# Patient Record
Sex: Female | Born: 1955 | Race: White | Hispanic: No | Marital: Married
Health system: Southern US, Academic
[De-identification: ages and names within clinical notes are randomized; demographics above are authoritative.]

## PROBLEM LIST (undated history)

## (undated) ENCOUNTER — Encounter: Attending: Neurology | Primary: Neurology

## (undated) ENCOUNTER — Ambulatory Visit: Payer: Medicare (Managed Care)

## (undated) ENCOUNTER — Ambulatory Visit
Attending: Student in an Organized Health Care Education/Training Program | Primary: Student in an Organized Health Care Education/Training Program

## (undated) ENCOUNTER — Encounter: Attending: Internal Medicine | Primary: Internal Medicine

## (undated) ENCOUNTER — Encounter

## (undated) ENCOUNTER — Ambulatory Visit: Attending: Internal Medicine | Primary: Internal Medicine

## (undated) ENCOUNTER — Ambulatory Visit: Payer: MEDICAID | Attending: Clinical | Primary: Clinical

## (undated) ENCOUNTER — Ambulatory Visit

## (undated) ENCOUNTER — Ambulatory Visit
Payer: MEDICAID | Attending: Student in an Organized Health Care Education/Training Program | Primary: Student in an Organized Health Care Education/Training Program

## (undated) ENCOUNTER — Ambulatory Visit
Payer: Medicare (Managed Care) | Attending: Student in an Organized Health Care Education/Training Program | Primary: Student in an Organized Health Care Education/Training Program

## (undated) ENCOUNTER — Ambulatory Visit: Payer: MEDICAID | Attending: Internal Medicine | Primary: Internal Medicine

## (undated) ENCOUNTER — Telehealth

## (undated) ENCOUNTER — Ambulatory Visit: Payer: MEDICAID

## (undated) ENCOUNTER — Encounter
Attending: Rehabilitative and Restorative Service Providers" | Primary: Rehabilitative and Restorative Service Providers"

## (undated) ENCOUNTER — Encounter
Attending: Student in an Organized Health Care Education/Training Program | Primary: Student in an Organized Health Care Education/Training Program

## (undated) ENCOUNTER — Telehealth
Attending: Student in an Organized Health Care Education/Training Program | Primary: Student in an Organized Health Care Education/Training Program

## (undated) ENCOUNTER — Encounter: Attending: Clinical | Primary: Clinical

## (undated) ENCOUNTER — Telehealth: Attending: Pharmacist | Primary: Pharmacist

## (undated) ENCOUNTER — Ambulatory Visit: Attending: Addiction (Substance Use Disorder) | Primary: Addiction (Substance Use Disorder)

## (undated) ENCOUNTER — Telehealth: Attending: Internal Medicine | Primary: Internal Medicine

## (undated) ENCOUNTER — Other Ambulatory Visit

## (undated) ENCOUNTER — Encounter
Attending: Pharmacist Clinician (PhC)/ Clinical Pharmacy Specialist | Primary: Pharmacist Clinician (PhC)/ Clinical Pharmacy Specialist

## (undated) ENCOUNTER — Ambulatory Visit: Payer: Medicare (Managed Care) | Attending: Internal Medicine | Primary: Internal Medicine

## (undated) ENCOUNTER — Encounter: Attending: Rheumatology | Primary: Rheumatology

## (undated) ENCOUNTER — Encounter: Attending: Diagnostic Radiology | Primary: Diagnostic Radiology

## (undated) ENCOUNTER — Ambulatory Visit: Attending: Clinical | Primary: Clinical

## (undated) ENCOUNTER — Ambulatory Visit
Attending: Rehabilitative and Restorative Service Providers" | Primary: Rehabilitative and Restorative Service Providers"

## (undated) ENCOUNTER — Ambulatory Visit: Attending: Neurology | Primary: Neurology

## (undated) ENCOUNTER — Encounter: Attending: Addiction (Substance Use Disorder) | Primary: Addiction (Substance Use Disorder)

## (undated) ENCOUNTER — Ambulatory Visit: Payer: MEDICARE

## (undated) ENCOUNTER — Telehealth: Attending: Ambulatory Care | Primary: Ambulatory Care

## (undated) ENCOUNTER — Encounter: Attending: Ophthalmology | Primary: Ophthalmology

## (undated) ENCOUNTER — Ambulatory Visit: Attending: Mental Health | Primary: Mental Health

## (undated) DIAGNOSIS — J449 Chronic obstructive pulmonary disease, unspecified: Secondary | ICD-10-CM

## (undated) DIAGNOSIS — J45909 Unspecified asthma, uncomplicated: Secondary | ICD-10-CM

## (undated) DIAGNOSIS — Z9981 Dependence on supplemental oxygen: Secondary | ICD-10-CM

## (undated) DIAGNOSIS — F329 Major depressive disorder, single episode, unspecified: Secondary | ICD-10-CM

## (undated) DIAGNOSIS — F419 Anxiety disorder, unspecified: Secondary | ICD-10-CM

## (undated) DIAGNOSIS — F32A Depression, unspecified: Secondary | ICD-10-CM

## (undated) DIAGNOSIS — C801 Malignant (primary) neoplasm, unspecified: Secondary | ICD-10-CM

---
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
## 1997-11-23 DIAGNOSIS — C539 Malignant neoplasm of cervix uteri, unspecified: Secondary | ICD-10-CM

## 1997-11-23 HISTORY — DX: Malignant neoplasm of cervix uteri, unspecified: C53.9

## 2015-10-23 ENCOUNTER — Other Ambulatory Visit: Payer: Self-pay | Admitting: Oncology

## 2015-10-23 ENCOUNTER — Ambulatory Visit
Admission: RE | Admit: 2015-10-23 | Discharge: 2015-10-23 | Disposition: A | Discharge status: Home / Self Care | Payer: Self-pay | Source: Ambulatory Visit | Attending: Oncology | Admitting: Oncology

## 2015-10-23 ENCOUNTER — Ambulatory Visit: Payer: Self-pay | Attending: Oncology

## 2015-10-23 ENCOUNTER — Ambulatory Visit: Payer: Self-pay

## 2015-10-23 VITALS — BP 101/65 | HR 71 | Temp 96.6°F | Resp 18 | Ht 69.69 in | Wt 110.3 lb

## 2015-10-23 DIAGNOSIS — N63 Unspecified lump in unspecified breast: Secondary | ICD-10-CM

## 2015-10-23 DIAGNOSIS — Z0000 Encounter for general adult medical examination without abnormal findings: Secondary | ICD-10-CM

## 2015-10-23 NOTE — Progress Notes (Signed)
Subjective:     Patient ID: Shelby Ibarra, female   DOB: [birth year], 59 y.o.   MRN: [id number]  HPI   Review of Systems     Objective:   Physical Exam  Pulmonary/Chest: Right breast exhibits no inverted nipple, no mass, no nipple discharge, no skin change and no tenderness. Left breast exhibits no inverted nipple, no mass, no nipple discharge, no skin change and no tenderness. Breasts are symmetrical.  Genitourinary: No labial fusion. There is no rash, tenderness, lesion or injury on the right labia. There is no rash, tenderness, lesion or injury on the left labia. No erythema, tenderness or bleeding in the vagina. No foreign body around the vagina. No signs of injury around the vagina. No vaginal discharge found.  Patient had hysterectomy/oopherectomy in 1999       Assessment:     59 year old patient presents for [facility] clinic visit.  Patient screened, and meets BCCCP eligibility.  Patient does not have insurance, Medicare or Medicaid.  Handout given on Affordable Care Act.  Instructed patient on breast self-exam using teach back method.  Patient had an abnormal mammogram in 1999 requiring follow-up, but patient did not return. Her mother ,and maternal grandmother both had breast cancer.  Her daughter had BRCA testing with positive results.  She has chosen surveillance at this time.  Patient is not interested in BRCA testing even after explanation of possible financial assistance.   CBE unremarkable.  No mass or lump palpated.  Patient had hysterectomy in 1999.  She has a history of cervical cancer. Pelvic exam normal. No cervix visualized.    Plan:     Per Janett Billow in [facility], patient requires a bilateral diagnostic mammogram, and left breast ultrasound due to prior mammogram results.

## 2015-10-29 LAB — PAP LB AND HPV HIGH-RISK
HPV, HIGH-RISK: NEGATIVE
PAP SMEAR COMMENT: 0

## 2015-10-31 ENCOUNTER — Other Ambulatory Visit: Payer: Self-pay

## 2015-10-31 DIAGNOSIS — R920 Mammographic microcalcification found on diagnostic imaging of breast: Secondary | ICD-10-CM

## 2015-11-07 NOTE — Progress Notes (Unsigned)
Letter mailed to patient to notify of 6 month follow-up mammogram on Thursday 05/07/16 ay 1:40 p.m.  Copy to [facility].

## 2016-05-07 ENCOUNTER — Ambulatory Visit: Admission: RE | Admit: 2016-05-07 | Payer: Self-pay | Source: Ambulatory Visit

## 2016-05-07 ENCOUNTER — Other Ambulatory Visit: Payer: Self-pay

## 2017-05-31 ENCOUNTER — Ambulatory Visit: Payer: Self-pay | Attending: Oncology

## 2018-03-21 ENCOUNTER — Ambulatory Visit
Admit: 2018-03-21 | Discharge: 2018-03-21 | Attending: Rehabilitative and Restorative Service Providers" | Primary: Rehabilitative and Restorative Service Providers"

## 2018-03-21 ENCOUNTER — Ambulatory Visit: Admit: 2018-03-21 | Discharge: 2018-03-21

## 2018-03-21 DIAGNOSIS — M25552 Pain in left hip: Secondary | ICD-10-CM

## 2018-03-21 DIAGNOSIS — M25551 Pain in right hip: Principal | ICD-10-CM

## 2018-03-21 MED ORDER — NAPROXEN 500 MG TABLET
ORAL_TABLET | Freq: Two times a day (BID) | ORAL | 1 refills | 0 days | Status: CP
Start: 2018-03-21 — End: ?

## 2019-01-13 ENCOUNTER — Inpatient Hospital Stay (HOSPITAL_COMMUNITY)
Admission: AD | Admit: 2019-01-13 | Payer: Federal, State, Local not specified - Other | Source: Intra-hospital | Admitting: Psychiatry

## 2019-05-09 ENCOUNTER — Emergency Department: Payer: Self-pay

## 2019-05-09 ENCOUNTER — Emergency Department
Admission: EM | Admit: 2019-05-09 | Discharge: 2019-05-09 | Disposition: A | Discharge status: Home / Self Care | Payer: Self-pay | Attending: Emergency Medicine | Admitting: Emergency Medicine

## 2019-05-09 ENCOUNTER — Other Ambulatory Visit: Payer: Self-pay

## 2019-05-09 ENCOUNTER — Encounter: Payer: Self-pay | Admitting: Emergency Medicine

## 2019-05-09 DIAGNOSIS — R531 Weakness: Secondary | ICD-10-CM

## 2019-05-09 DIAGNOSIS — J45909 Unspecified asthma, uncomplicated: Secondary | ICD-10-CM | POA: Insufficient documentation

## 2019-05-09 DIAGNOSIS — R4182 Altered mental status, unspecified: Secondary | ICD-10-CM

## 2019-05-09 DIAGNOSIS — Z87891 Personal history of nicotine dependence: Secondary | ICD-10-CM | POA: Insufficient documentation

## 2019-05-09 DIAGNOSIS — Z20828 Contact with and (suspected) exposure to other viral communicable diseases: Secondary | ICD-10-CM | POA: Insufficient documentation

## 2019-05-09 DIAGNOSIS — Z8541 Personal history of malignant neoplasm of cervix uteri: Secondary | ICD-10-CM | POA: Insufficient documentation

## 2019-05-09 HISTORY — DX: Malignant (primary) neoplasm, unspecified: C80.1

## 2019-05-09 HISTORY — DX: Unspecified asthma, uncomplicated: J45.909

## 2019-05-09 HISTORY — DX: Depression, unspecified: F32.A

## 2019-05-09 HISTORY — DX: Anxiety disorder, unspecified: F41.9

## 2019-05-09 LAB — URINALYSIS, COMPLETE (UACMP) WITH MICROSCOPIC
Bacteria, UA: NONE SEEN
Bilirubin Urine: NEGATIVE
Glucose, UA: NEGATIVE mg/dL
Hgb urine dipstick: NEGATIVE
Ketones, ur: NEGATIVE mg/dL
Leukocytes,Ua: NEGATIVE
Nitrite: NEGATIVE
Protein, ur: NEGATIVE mg/dL
Specific Gravity, Urine: 1.012 (ref 1.005–1.030)
pH: 6 (ref 5.0–8.0)

## 2019-05-09 LAB — COMPREHENSIVE METABOLIC PANEL
ALT: 8 U/L (ref 0–44)
AST: 13 U/L — ABNORMAL LOW (ref 15–41)
Albumin: 3.5 g/dL (ref 3.5–5.0)
Alkaline Phosphatase: 77 U/L (ref 38–126)
Anion gap: 7 (ref 5–15)
BUN: 14 mg/dL (ref 6–20)
CO2: 29 mmol/L (ref 22–32)
Calcium: 8.9 mg/dL (ref 8.9–10.3)
Chloride: 104 mmol/L (ref 98–111)
Creatinine, Ser: 0.61 mg/dL (ref 0.44–1.00)
GFR calc Af Amer: >60 mL/min (ref >60)
GFR calc non Af Amer: >60 mL/min (ref >60)
Glucose, Bld: 102 mg/dL — ABNORMAL HIGH (ref 70–99)
Potassium: 3.8 mmol/L (ref 3.5–5.1)
Sodium: 140 mmol/L (ref 135–145)
Total Bilirubin: 0.4 mg/dL (ref 0.3–1.2)
Total Protein: 7.7 g/dL (ref 6.5–8.1)

## 2019-05-09 LAB — CBC
HCT: 38 % (ref 36.0–46.0)
Hemoglobin: 12.1 g/dL (ref 12.0–15.0)
MCH: 29.5 pg (ref 26.0–34.0)
MCHC: 31.8 g/dL (ref 30.0–36.0)
MCV: 92.7 fL (ref 80.0–100.0)
Platelets: 188 10*3/uL (ref 150–400)
RBC: 4.1 MIL/uL (ref 3.87–5.11)
RDW: 13.2 % (ref 11.5–15.5)
WBC: 3.5 10*3/uL — ABNORMAL LOW (ref 4.0–10.5)
nRBC: 0 % (ref 0.0–0.2)

## 2019-05-09 LAB — SARS CORONAVIRUS 2 BY RT PCR (HOSPITAL ORDER, PERFORMED IN ~~LOC~~ HOSPITAL LAB): SARS Coronavirus 2: NEGATIVE

## 2019-05-09 LAB — LACTIC ACID, PLASMA: Lactic Acid, Venous: 0.5 mmol/L (ref 0.5–1.9)

## 2019-05-09 LAB — TROPONIN I: Troponin I: <0.03 ng/mL (ref <0.03)

## 2019-05-09 MED ORDER — ALBUTEROL SULFATE HFA 108 (90 BASE) MCG/ACT IN AERS: 2.0000 | INHALATION_SPRAY | Freq: Four times a day (QID) | RESPIRATORY_TRACT | 0 refills | Status: AC | PRN

## 2019-05-09 MED ORDER — ALBUTEROL SULFATE (2.5 MG/3ML) 0.083% IN NEBU
2.5000 mg | INHALATION_SOLUTION | Freq: Once | RESPIRATORY_TRACT | Status: AC
  Administered 2019-05-09: 13:00:00 2.5 mg via RESPIRATORY_TRACT
  Filled 2019-05-09: qty 3

## 2019-05-09 MED ORDER — SODIUM CHLORIDE 0.9% FLUSH
3.0000 mL | Freq: Once | INTRAVENOUS | Status: AC
  Administered 2019-05-09: 13:00:00 3 mL via INTRAVENOUS

## 2019-05-09 MED ORDER — AZITHROMYCIN 250 MG PO TABS: ORAL_TABLET | ORAL | 0 refills | Status: DC

## 2019-05-09 MED ORDER — PREDNISONE 20 MG PO TABS: 40.0000 mg | ORAL_TABLET | Freq: Every day | ORAL | 0 refills | Status: AC

## 2019-05-09 MED ORDER — SODIUM CHLORIDE 0.9 % IV BOLUS
500.0000 mL | Freq: Once | INTRAVENOUS | Status: AC
  Administered 2019-05-09: 13:00:00 500 mL via INTRAVENOUS

## 2019-05-09 NOTE — ED Notes (Signed)
MD at bedside. 

## 2019-05-09 NOTE — ED Triage Notes (Signed)
Pt daughter reports that when her mom awoke from her sleep this am about 30 minutes ago she was having difficulty walking and speaking and stated that she saw someone in the house. Pt reports that she hit her head when the trunk closed on it about 4 days ago.

## 2019-05-09 NOTE — ED Notes (Signed)
Pt family updated

## 2019-05-09 NOTE — ED Notes (Signed)
CT to take pt soon. Will collect blood work once back from CT.

## 2019-05-09 NOTE — ED Notes (Addendum)
Pt assisted to bedside toilet. Pt able to take 5 steps to toilet and back to bed without becoming unsteady or too weak. Daughter remains at bedside.

## 2019-05-09 NOTE — Discharge Instructions (Addendum)
It is possible that you have COPD which has not yet been diagnosed, or you are having acute bronchitis or inflammation of the lungs.    Take the prednisone as prescribed for the next 5 days and use the albuterol every 4-6 hours.  Return to the ER for new or worsening shortness of breath, wheezing, chest pain, fever, recurrent confusion, weakness, difficulty walking, or any other new or worsening symptoms that concern you.

## 2019-05-09 NOTE — ED Provider Notes (Signed)
[facility] Emergency Department Provider Note ____________________________________________   First MD Initiated Contact with Patient 05/09/19 1038     (approximate)  I have reviewed the triage vital signs and the nursing notes.   HISTORY  Chief Complaint Hallucinations, Headache, and Aphasia    HPI Shelby Ibarra is a 63 y.o. female with PMH as noted below who presents with difficulty walking, acute onset today, now somewhat improved, and associated with confusion.  The patient reports that she hit her head with a trunk lid 4 days ago but did not lose consciousness.  She had no other injuries.  She developed a headache this morning.  When the daughter spoke to her on the phone she noted that the patient was confused and was reporting that there was a man inside her house, and then when the daughter came to the house she noted that the patient was having difficulty walking.  In addition the patient has had cough and some shortness of breath with wheezing over the last few weeks.  She has had no fever or chest pain.  She is a former longtime smoker but has never been formally diagnosed with COPD.  Past Medical History:  Diagnosis Date  . Anxiety   . Asthma   . Cancer ([place])   . Cervical cancer ([place]) 1999  . Depression     There are no active problems to display for this patient.   No past surgical history on file.  Prior to Admission medications   Medication Sig Start Date End Date Taking? Authorizing Provider  albuterol (VENTOLIN HFA) 108 (90 Base) MCG/ACT inhaler Inhale 2 puffs into the lungs every 6 (six) hours as needed for wheezing or shortness of breath. 05/09/19   Arta Silence, MD  azithromycin (ZITHROMAX Z-PAK) 250 MG tablet 2 tablets (500mg ) on day 1, then 1 tablet PO daily x 4 days 05/09/19   Arta Silence, MD  predniSONE (DELTASONE) 20 MG tablet Take 2 tablets (40 mg total) by mouth daily for 5 days. 05/09/19 05/14/19  Arta Silence, MD    Allergies Patient has no known allergies.  Family History  Problem Relation Age of Onset  . Breast cancer Mother 65       Deceased  . Ovarian cancer Mother 22  . Breast cancer Maternal Grandmother        approximately 67's    Social History Social History   Tobacco Use  . Smoking status: Former Smoker    Packs/day: 2.00    Years: 25.00    Pack years: 50.00    Types: Cigarettes    Quit date: 10/22/2009    Years since quitting: 9.5  . Smokeless tobacco: Never Used  Substance Use Topics  . Alcohol use: No    Alcohol/week: 0.0 standard drinks  . Drug use: No    Review of Systems  Constitutional: No fever/chills. Eyes: No redness. ENT: No neck pain. Cardiovascular: Denies chest pain. Respiratory: Positive for shortness of breath. Gastrointestinal: No vomiting or diarrhea.  Genitourinary: Negative for flank pain.  Musculoskeletal: Negative for back pain. Skin: Negative for rash. Neurological: Positive for headache.   ____________________________________________   PHYSICAL EXAM:  VITAL SIGNS: ED Triage Vitals  Enc Vitals Group     BP 05/09/19 1003 (!) 115/59     Pulse Rate 05/09/19 1003 (!) 107     Resp 05/09/19 1003 18     Temp 05/09/19 1003 98.1 F (36.7 C)     Temp Source 05/09/19 1003  Oral     SpO2 05/09/19 1003 95 %     Weight 05/09/19 1000 120 lb (54.4 kg)     Height 05/09/19 1000 5\' 11"  (1.803 m)     Head Circumference --      Peak Flow --      Pain Score 05/09/19 0959 10     Pain Loc --      Pain Edu? --      Excl. in [place]? --     Constitutional: Alert and oriented x4.  Somewhat frail-appearing but in no acute distress. Eyes: Conjunctivae are normal.  EOMI.  PERRLA. Head: Atraumatic. Nose: No congestion/rhinnorhea. Mouth/Throat: Mucous membranes are slightly dry.   Neck: Normal range of motion.  Cardiovascular: Normal rate, regular rhythm. Grossly normal heart sounds.  Good peripheral circulation. Respiratory: Normal  respiratory effort.  No retractions.  Bilateral expiratory wheezing. Gastrointestinal: Soft and nontender. No distention.  Genitourinary: No flank tenderness. Musculoskeletal: No lower extremity edema.  Extremities warm and well perfused.  Neurologic:  Normal speech and language.  No facial droop.  Motor intact and sensory in all extremities.  No pronator drift.  Normal coordination on finger-to-nose.  No acute neurologic abnormalities noted. Skin:  Skin is warm and dry. No rash noted. Psychiatric: Mood and affect are normal. Speech and behavior are normal.  ____________________________________________   LABS (all labs ordered are listed, but only abnormal results are displayed)  Labs Reviewed  COMPREHENSIVE METABOLIC PANEL - Abnormal; Notable for the following components:      Result Value   Glucose, Bld 102 (*)    AST 13 (*)    All other components within normal limits  CBC - Abnormal; Notable for the following components:   WBC 3.5 (*)    All other components within normal limits  URINALYSIS, COMPLETE (UACMP) WITH MICROSCOPIC - Abnormal; Notable for the following components:   Color, Urine YELLOW (*)    APPearance CLEAR (*)    All other components within normal limits  SARS CORONAVIRUS 2 (HOSPITAL ORDER, [facility])  TROPONIN I  LACTIC ACID, PLASMA  LACTIC ACID, PLASMA   ____________________________________________  EKG  ED ECG REPORT I, Arta Silence, the attending physician, personally viewed and interpreted this ECG.  Date: 05/09/2019 EKG Time: 1217 Rate: 87 Rhythm: normal sinus rhythm QRS Axis: normal Intervals: normal ST/T Wave abnormalities: normal Narrative Interpretation: no evidence of acute ischemia  ____________________________________________  RADIOLOGY  CT head: No acute abnormality CXR: Possible faint infiltrate right lower base and findings consistent with COPD.  ____________________________________________    PROCEDURES  Procedure(s) performed: No  Procedures  Critical Care performed: No ____________________________________________   INITIAL IMPRESSION / ASSESSMENT AND PLAN / ED COURSE  Pertinent labs & imaging results that were available during my care of the patient were reviewed by me and considered in my medical decision making (see chart for details).  63 year old female with PMH as noted above presents with headache, difficulty walking, and altered mental status noted today.  The patient had a minor head trauma with no LOC 4 days ago but had no symptoms after that.  In addition the patient has had some cough and shortness of breath for the last few weeks with no fever.  I reviewed the past medical records in [place].  The patient has no prior ED visits or admissions here.  On exam the patient is somewhat frail and appears older than her stated age, but she is alert and conversant.  She is oriented  x4.  Her vital signs are normal except for borderline tachycardia.  Neurologic exam is nonfocal.  There is no visible trauma.  She does have diffuse wheezing bilaterally but no respiratory distress or increased work of breathing.  Overall I am most concerned for a possible ICH versus postconcussive type symptoms, with less likely TIA.  The patient has no ongoing neuro deficits to suggest acute stroke.  Differential also includes infectious etiologies, dehydration, or other metabolic cause.  I have a low suspicion for COVID-19 as she has no high risk exposures, but given the cough and shortness of breath I will test for it.  We will obtain CT head, chest x-ray, lab work-up, and reassess.  ----------------------------------------- 3:18 PM on 05/09/2019 -----------------------------------------  CT head shows no acute abnormalities.  Chest x-ray shows findings compatible with COPD and a possible mild right lower lobe infiltrate.  The lab work-up is reassuring.  The patient has no UTI or other  evidence of active infection or sepsis.  Troponin is negative.  COVID swab is negative as well.  On reassessment, the patient appears well and states that she feels comfortable.  She has had no further confusion while in the ED and the daughter states she is at her baseline.  After reviewing the work-up I think it is likely that the patient has undiagnosed COPD and is possible that with the wheezing and shortness of that she is having a mild exacerbation, which could have caused hypercapnia this morning, resulting in transient altered mental status.  Given that the patient has been in the ED for almost 6 hours and has been at her baseline with no recurrent AMS and no significant respiratory symptoms, she is stable for discharge home.  The patient is eager to go home and her daughter agrees with the plan.  I counseled him on the results of the work-up.  I will start the patient on a course of prednisone and prescribe an albuterol inhaler.  I also prescribed azithromycin for a possible early infiltrate.  I gave the patient and her daughter thorough return precautions and they expressed understanding. _____________________________  Shelby Ibarra was evaluated in Emergency Department on 05/09/2019 for the symptoms described in the history of present illness. She was evaluated in the context of the global COVID-19 pandemic, which necessitated consideration that the patient might be at risk for infection with the SARS-CoV-2 virus that causes COVID-19. Institutional protocols and algorithms that pertain to the evaluation of patients at risk for COVID-19 are in a state of rapid change based on information released by regulatory bodies including the CDC and federal and state organizations. These policies and algorithms were followed during the patient's care in the ED.  ____________________________________________   FINAL CLINICAL IMPRESSION(S) / ED DIAGNOSES  Final diagnoses:  Weakness  Altered mental  status, unspecified altered mental status type      NEW MEDICATIONS STARTED DURING THIS VISIT:  New Prescriptions   ALBUTEROL (VENTOLIN HFA) 108 (90 BASE) MCG/ACT INHALER    Inhale 2 puffs into the lungs every 6 (six) hours as needed for wheezing or shortness of breath.   AZITHROMYCIN (ZITHROMAX Z-PAK) 250 MG TABLET    2 tablets (500mg ) on day 1, then 1 tablet PO daily x 4 days   PREDNISONE (DELTASONE) 20 MG TABLET    Take 2 tablets (40 mg total) by mouth daily for 5 days.     Note:  This document was prepared using Dragon voice recognition software and may include unintentional dictation  errors.    Arta Silence, MD 05/09/19 1520

## 2019-05-09 NOTE — ED Notes (Signed)
EKG completed

## 2019-06-17 ENCOUNTER — Ambulatory Visit: Admit: 2019-06-17 | Discharge: 2019-06-17

## 2019-08-02 ENCOUNTER — Ambulatory Visit
Admit: 2019-08-02 | Discharge: 2019-08-03 | Attending: Student in an Organized Health Care Education/Training Program | Primary: Student in an Organized Health Care Education/Training Program

## 2019-08-02 DIAGNOSIS — R51 Headache: Secondary | ICD-10-CM

## 2019-08-02 DIAGNOSIS — R404 Transient alteration of awareness: Secondary | ICD-10-CM

## 2019-08-02 DIAGNOSIS — R269 Unspecified abnormalities of gait and mobility: Secondary | ICD-10-CM

## 2019-08-24 ENCOUNTER — Ambulatory Visit: Admit: 2019-08-24 | Discharge: 2019-08-25

## 2019-08-24 DIAGNOSIS — R269 Unspecified abnormalities of gait and mobility: Secondary | ICD-10-CM

## 2019-08-29 ENCOUNTER — Ambulatory Visit: Admit: 2019-08-29 | Discharge: 2019-08-30

## 2019-08-29 DIAGNOSIS — R269 Unspecified abnormalities of gait and mobility: Secondary | ICD-10-CM

## 2019-09-21 ENCOUNTER — Ambulatory Visit
Admit: 2019-09-21 | Discharge: 2019-09-21 | Attending: Student in an Organized Health Care Education/Training Program | Primary: Student in an Organized Health Care Education/Training Program

## 2019-09-21 ENCOUNTER — Ambulatory Visit: Admit: 2019-09-21 | Discharge: 2019-09-21

## 2019-09-21 DIAGNOSIS — R269 Unspecified abnormalities of gait and mobility: Principal | ICD-10-CM

## 2019-09-28 DIAGNOSIS — E559 Vitamin D deficiency, unspecified: Principal | ICD-10-CM

## 2019-09-28 MED ORDER — CHOLECALCIFEROL (VITAMIN D3) 125 MCG (5,000 UNIT) TABLET
ORAL_TABLET | Freq: Every day | ORAL | 1 refills | 30 days | Status: CP
Start: 2019-09-28 — End: 2019-11-27

## 2019-09-29 DIAGNOSIS — G249 Dystonia, unspecified: Principal | ICD-10-CM

## 2019-09-30 DIAGNOSIS — G249 Dystonia, unspecified: Principal | ICD-10-CM

## 2019-10-03 ENCOUNTER — Ambulatory Visit
Admit: 2019-10-03 | Discharge: 2019-11-01 | Attending: Rehabilitative and Restorative Service Providers" | Primary: Rehabilitative and Restorative Service Providers"

## 2019-10-03 ENCOUNTER — Ambulatory Visit: Admit: 2019-10-03 | Discharge: 2019-11-01

## 2019-10-09 ENCOUNTER — Ambulatory Visit: Admit: 2019-10-09 | Discharge: 2019-10-10

## 2019-10-11 ENCOUNTER — Ambulatory Visit
Admit: 2019-10-11 | Discharge: 2019-10-11 | Disposition: A | Discharge status: Home / Self Care | Payer: MEDICAID | Attending: Student in an Organized Health Care Education/Training Program

## 2019-10-11 DIAGNOSIS — G629 Polyneuropathy, unspecified: Principal | ICD-10-CM

## 2019-10-11 DIAGNOSIS — M792 Neuralgia and neuritis, unspecified: Principal | ICD-10-CM

## 2019-10-11 DIAGNOSIS — R062 Wheezing: Principal | ICD-10-CM

## 2019-10-11 MED ORDER — AMITRIPTYLINE 25 MG TABLET
ORAL_TABLET | Freq: Every evening | ORAL | 3 refills | 30.00000 days | Status: CP
Start: 2019-10-11 — End: 2020-02-08

## 2019-10-12 DIAGNOSIS — M3509 Sicca syndrome with other organ involvement: Principal | ICD-10-CM

## 2019-10-12 DIAGNOSIS — Z7952 Long term (current) use of systemic steroids: Principal | ICD-10-CM

## 2019-10-12 MED ORDER — PANTOPRAZOLE 40 MG TABLET,DELAYED RELEASE
ORAL_TABLET | Freq: Every day | ORAL | 1 refills | 30.00000 days | Status: CP
Start: 2019-10-12 — End: 2020-10-11

## 2019-10-12 MED ORDER — CALCIUM CARBONATE 500 MG CALCIUM (1,250 MG) CHEWABLE TABLET
ORAL_TABLET | Freq: Every day | ORAL | 11 refills | 30 days | Status: CP
Start: 2019-10-12 — End: 2020-10-11

## 2019-10-12 MED ORDER — PREDNISONE 50 MG TABLET
ORAL_TABLET | Freq: Every day | ORAL | 0 refills | 30 days | Status: CP
Start: 2019-10-12 — End: 2019-11-11

## 2019-10-13 MED ORDER — SULFAMETHOXAZOLE 800 MG-TRIMETHOPRIM 160 MG TABLET
ORAL_TABLET | ORAL | 2 refills | 28.00000 days | Status: CP
Start: 2019-10-13 — End: 2020-01-11

## 2019-10-16 DIAGNOSIS — M3500 Sicca syndrome, unspecified: Principal | ICD-10-CM

## 2019-10-17 ENCOUNTER — Ambulatory Visit: Admit: 2019-10-17 | Discharge: 2019-10-18

## 2019-10-23 ENCOUNTER — Ambulatory Visit: Admit: 2019-10-23 | Discharge: 2019-10-23

## 2019-10-23 ENCOUNTER — Ambulatory Visit: Admit: 2019-10-23 | Discharge: 2019-10-23 | Attending: Internal Medicine | Primary: Internal Medicine

## 2019-10-30 ENCOUNTER — Ambulatory Visit: Admit: 2019-10-30 | Discharge: 2019-10-31

## 2019-10-30 DIAGNOSIS — M3500 Sicca syndrome, unspecified: Principal | ICD-10-CM

## 2019-10-31 ENCOUNTER — Ambulatory Visit: Admit: 2019-10-31 | Discharge: 2019-11-01

## 2019-11-01 ENCOUNTER — Ambulatory Visit: Admit: 2019-11-01 | Discharge: 2019-11-02

## 2019-11-01 DIAGNOSIS — M3500 Sicca syndrome, unspecified: Principal | ICD-10-CM

## 2019-11-06 ENCOUNTER — Ambulatory Visit: Admit: 2019-11-06 | Discharge: 2019-12-01

## 2019-11-06 ENCOUNTER — Ambulatory Visit
Admit: 2019-11-06 | Discharge: 2019-12-01 | Attending: Rehabilitative and Restorative Service Providers" | Primary: Rehabilitative and Restorative Service Providers"

## 2019-11-08 ENCOUNTER — Ambulatory Visit: Admit: 2019-11-08 | Discharge: 2019-11-09 | Attending: Ophthalmology | Primary: Ophthalmology

## 2019-11-21 DIAGNOSIS — M3500 Sicca syndrome, unspecified: Principal | ICD-10-CM

## 2019-11-29 ENCOUNTER — Ambulatory Visit: Admit: 2019-11-29 | Discharge: 2019-11-29

## 2019-12-02 MED ORDER — PREDNISONE 20 MG TABLET
ORAL_TABLET | Freq: Every day | ORAL | 0 refills | 14.00000 days | Status: CP
Start: 2019-12-02 — End: 2019-12-16

## 2019-12-05 ENCOUNTER — Ambulatory Visit
Admit: 2019-12-05 | Discharge: 2019-12-31 | Attending: Rehabilitative and Restorative Service Providers" | Primary: Rehabilitative and Restorative Service Providers"

## 2019-12-05 ENCOUNTER — Ambulatory Visit: Admit: 2019-12-05 | Discharge: 2019-12-31

## 2019-12-06 ENCOUNTER — Ambulatory Visit: Admit: 2019-12-06 | Discharge: 2019-12-07

## 2019-12-13 ENCOUNTER — Ambulatory Visit: Admit: 2019-12-13 | Discharge: 2019-12-14 | Attending: Internal Medicine | Primary: Internal Medicine

## 2019-12-13 ENCOUNTER — Ambulatory Visit: Admit: 2019-12-13 | Discharge: 2019-12-14

## 2019-12-13 MED ORDER — AMITRIPTYLINE 25 MG TABLET
ORAL_TABLET | Freq: Every evening | ORAL | 3 refills | 30.00000 days | Status: CP
Start: 2019-12-13 — End: 2020-04-11

## 2019-12-13 MED ORDER — ALBUTEROL SULFATE HFA 90 MCG/ACTUATION AEROSOL INHALER
RESPIRATORY_TRACT | 3 refills | 0 days | Status: CP | PRN
Start: 2019-12-13 — End: ?

## 2019-12-17 MED ORDER — PREDNISONE 10 MG TABLET
ORAL_TABLET | Freq: Every day | ORAL | 0 refills | 14 days | Status: CP
Start: 2019-12-17 — End: 2019-12-31

## 2019-12-21 ENCOUNTER — Telehealth: Admit: 2019-12-21 | Discharge: 2019-12-22 | Attending: Internal Medicine | Primary: Internal Medicine

## 2019-12-29 ENCOUNTER — Telehealth: Admit: 2019-12-29 | Discharge: 2019-12-30

## 2019-12-29 DIAGNOSIS — M2550 Pain in unspecified joint: Principal | ICD-10-CM

## 2019-12-29 DIAGNOSIS — G249 Dystonia, unspecified: Principal | ICD-10-CM

## 2019-12-29 DIAGNOSIS — M3501 Sicca syndrome with keratoconjunctivitis: Principal | ICD-10-CM

## 2020-01-01 MED ORDER — PREDNISONE 20 MG TABLET
ORAL_TABLET | Freq: Every day | ORAL | 0 refills | 14.00000 days | Status: CP
Start: 2020-01-01 — End: 2020-01-15

## 2020-01-03 DIAGNOSIS — M2550 Pain in unspecified joint: Principal | ICD-10-CM

## 2020-01-03 DIAGNOSIS — M3501 Sicca syndrome with keratoconjunctivitis: Principal | ICD-10-CM

## 2020-01-03 MED ORDER — PILOCARPINE 5 MG TABLET
ORAL_TABLET | Freq: Three times a day (TID) | ORAL | 2 refills | 30 days | Status: CP
Start: 2020-01-03 — End: 2021-01-02
  Filled 2020-01-08: qty 90, 30d supply, fill #0

## 2020-01-03 MED ORDER — HYDROXYCHLOROQUINE 200 MG TABLET
ORAL_TABLET | Freq: Every day | ORAL | 5 refills | 30.00000 days | Status: CP
Start: 2020-01-03 — End: 2021-01-02
  Filled 2020-01-08: qty 30, 30d supply, fill #0

## 2020-01-08 ENCOUNTER — Other Ambulatory Visit: Admit: 2020-01-08 | Discharge: 2020-01-09

## 2020-01-08 ENCOUNTER — Ambulatory Visit: Admit: 2020-01-08 | Discharge: 2020-01-09

## 2020-01-08 DIAGNOSIS — M2550 Pain in unspecified joint: Principal | ICD-10-CM

## 2020-01-08 MED FILL — HYDROXYCHLOROQUINE 200 MG TABLET: 30 days supply | Qty: 30 | Fill #0 | Status: AC

## 2020-01-08 MED FILL — PILOCARPINE 5 MG TABLET: 30 days supply | Qty: 90 | Fill #0 | Status: AC

## 2020-01-15 MED ORDER — PREDNISONE 10 MG TABLET
ORAL_TABLET | Freq: Every day | ORAL | 0 refills | 31 days | Status: CP
Start: 2020-01-15 — End: 2020-02-15

## 2020-02-14 ENCOUNTER — Ambulatory Visit
Admit: 2020-02-14 | Discharge: 2020-02-15 | Attending: Student in an Organized Health Care Education/Training Program | Primary: Student in an Organized Health Care Education/Training Program

## 2020-02-14 MED ORDER — PREDNISONE 2.5 MG TABLET
ORAL_TABLET | 0 refills | 0 days | Status: CP
Start: 2020-02-14 — End: ?

## 2020-02-22 MED FILL — HYDROXYCHLOROQUINE 200 MG TABLET: ORAL | 90 days supply | Qty: 90 | Fill #1

## 2020-02-22 MED FILL — HYDROXYCHLOROQUINE 200 MG TABLET: 90 days supply | Qty: 90 | Fill #1 | Status: AC

## 2020-03-14 ENCOUNTER — Ambulatory Visit: Admit: 2020-03-14 | Discharge: 2020-03-15

## 2020-03-14 DIAGNOSIS — Z79899 Other long term (current) drug therapy: Principal | ICD-10-CM

## 2020-03-14 DIAGNOSIS — R0600 Dyspnea, unspecified: Principal | ICD-10-CM

## 2020-03-14 DIAGNOSIS — M3501 Sicca syndrome with keratoconjunctivitis: Principal | ICD-10-CM

## 2020-03-15 MED ORDER — HYDROXYCHLOROQUINE 200 MG TABLET
ORAL_TABLET | Freq: Every day | ORAL | 5 refills | 30 days | Status: CP
Start: 2020-03-15 — End: 2021-03-15

## 2020-04-19 MED FILL — PILOCARPINE 5 MG TABLET: ORAL | 30 days supply | Qty: 90 | Fill #1

## 2020-04-19 MED FILL — PILOCARPINE 5 MG TABLET: 30 days supply | Qty: 90 | Fill #1 | Status: AC

## 2020-04-23 MED ORDER — ALBUTEROL SULFATE HFA 90 MCG/ACTUATION AEROSOL INHALER
RESPIRATORY_TRACT | 0 refills | 0 days | Status: CP | PRN
Start: 2020-04-23 — End: ?

## 2020-05-08 ENCOUNTER — Ambulatory Visit: Admit: 2020-05-08 | Discharge: 2020-05-09

## 2020-05-15 DIAGNOSIS — M3501 Sicca syndrome with keratoconjunctivitis: Principal | ICD-10-CM

## 2020-05-15 DIAGNOSIS — J449 Chronic obstructive pulmonary disease, unspecified: Principal | ICD-10-CM

## 2020-05-15 DIAGNOSIS — R0600 Dyspnea, unspecified: Principal | ICD-10-CM

## 2020-05-28 ENCOUNTER — Ambulatory Visit: Admit: 2020-05-28 | Discharge: 2020-05-29 | Attending: Internal Medicine | Primary: Internal Medicine

## 2020-05-28 ENCOUNTER — Ambulatory Visit: Admit: 2020-05-28 | Discharge: 2020-05-29

## 2020-05-28 DIAGNOSIS — R0902 Hypoxemia: Principal | ICD-10-CM

## 2020-05-28 DIAGNOSIS — F418 Other specified anxiety disorders: Principal | ICD-10-CM

## 2020-05-28 DIAGNOSIS — R0602 Shortness of breath: Principal | ICD-10-CM

## 2020-05-28 DIAGNOSIS — R7989 Other specified abnormal findings of blood chemistry: Principal | ICD-10-CM

## 2020-05-28 DIAGNOSIS — J438 Other emphysema: Principal | ICD-10-CM

## 2020-05-28 MED ORDER — ALBUTEROL SULFATE HFA 90 MCG/ACTUATION AEROSOL INHALER
RESPIRATORY_TRACT | 0 refills | 0 days | Status: CP | PRN
Start: 2020-05-28 — End: ?
  Filled 2020-05-31: qty 8.5, 25d supply, fill #0

## 2020-05-28 MED ORDER — PAROXETINE 40 MG TABLET
ORAL_TABLET | Freq: Every morning | ORAL | 0 refills | 90 days | Status: CP
Start: 2020-05-28 — End: ?
  Filled 2020-06-27: qty 180, 90d supply, fill #0

## 2020-05-28 MED ORDER — IPRATROPIUM 0.5 MG-ALBUTEROL 3 MG (2.5 MG BASE)/3 ML NEBULIZATION SOLN
Freq: Four times a day (QID) | RESPIRATORY_TRACT | 3 refills | 1.00000 days | Status: CP | PRN
Start: 2020-05-28 — End: 2021-05-28

## 2020-05-29 ENCOUNTER — Ambulatory Visit: Admit: 2020-05-29 | Discharge: 2020-05-30 | Attending: Internal Medicine | Primary: Internal Medicine

## 2020-05-29 ENCOUNTER — Ambulatory Visit: Admit: 2020-05-29 | Discharge: 2020-05-30

## 2020-05-29 DIAGNOSIS — J449 Chronic obstructive pulmonary disease, unspecified: Principal | ICD-10-CM

## 2020-05-29 DIAGNOSIS — R0600 Dyspnea, unspecified: Principal | ICD-10-CM

## 2020-05-29 DIAGNOSIS — M3501 Sicca syndrome with keratoconjunctivitis: Principal | ICD-10-CM

## 2020-05-29 DIAGNOSIS — J42 Unspecified chronic bronchitis: Principal | ICD-10-CM

## 2020-05-29 MED ORDER — FLUTICASONE 500 MCG-SALMETEROL 50 MCG/DOSE BLISTR POWDR FOR INHALATION
Freq: Two times a day (BID) | RESPIRATORY_TRACT | 11 refills | 30.00000 days | Status: CP
Start: 2020-05-29 — End: 2021-05-29
  Filled 2020-06-03: qty 60, 30d supply, fill #0

## 2020-05-29 MED ORDER — MONTELUKAST 10 MG TABLET
ORAL_TABLET | Freq: Every evening | ORAL | 11 refills | 30 days | Status: CP
Start: 2020-05-29 — End: 2020-06-28
  Filled 2020-06-03: qty 30, 30d supply, fill #0

## 2020-05-29 MED ORDER — SODIUM CHLORIDE 3 % FOR NEBULIZATION
Freq: Two times a day (BID) | RESPIRATORY_TRACT | 11 refills | 30.00000 days | Status: CP
Start: 2020-05-29 — End: 2020-06-28
  Filled 2020-06-03: qty 240, 30d supply, fill #0

## 2020-05-31 MED FILL — ALBUTEROL SULFATE HFA 90 MCG/ACTUATION AEROSOL INHALER: 25 days supply | Qty: 8 | Fill #0 | Status: AC

## 2020-06-03 MED FILL — WIXELA INHUB 500 MCG-50 MCG/DOSE POWDER FOR INHALATION: 30 days supply | Qty: 60 | Fill #0 | Status: AC

## 2020-06-03 MED FILL — SODIUM CHLORIDE 3 % FOR NEBULIZATION: 30 days supply | Qty: 240 | Fill #0 | Status: AC

## 2020-06-03 MED FILL — MONTELUKAST 10 MG TABLET: 30 days supply | Qty: 30 | Fill #0 | Status: AC

## 2020-06-13 MED FILL — IPRATROPIUM 0.5 MG-ALBUTEROL 3 MG (2.5 MG BASE)/3 ML NEBULIZATION SOLN: 8 days supply | Qty: 90 | Fill #0 | Status: AC

## 2020-06-13 MED FILL — IPRATROPIUM 0.5 MG-ALBUTEROL 3 MG (2.5 MG BASE)/3 ML NEBULIZATION SOLN: RESPIRATORY_TRACT | 8 days supply | Qty: 90 | Fill #0

## 2020-06-18 ENCOUNTER — Institutional Professional Consult (permissible substitution): Admit: 2020-06-18 | Discharge: 2020-06-18

## 2020-06-18 ENCOUNTER — Ambulatory Visit: Admit: 2020-06-18 | Discharge: 2020-06-18

## 2020-06-18 ENCOUNTER — Ambulatory Visit: Admit: 2020-06-18 | Discharge: 2020-06-18 | Attending: Dermatology | Primary: Dermatology

## 2020-06-18 DIAGNOSIS — M3501 Sicca syndrome with keratoconjunctivitis: Principal | ICD-10-CM

## 2020-06-18 DIAGNOSIS — I872 Venous insufficiency (chronic) (peripheral): Principal | ICD-10-CM

## 2020-06-18 DIAGNOSIS — I878 Other specified disorders of veins: Principal | ICD-10-CM

## 2020-06-27 MED FILL — PILOCARPINE 5 MG TABLET: ORAL | 30 days supply | Qty: 90 | Fill #2

## 2020-06-27 MED FILL — MONTELUKAST 10 MG TABLET: ORAL | 30 days supply | Qty: 30 | Fill #1

## 2020-06-27 MED FILL — MONTELUKAST 10 MG TABLET: 30 days supply | Qty: 30 | Fill #1 | Status: AC

## 2020-06-27 MED FILL — PILOCARPINE 5 MG TABLET: 30 days supply | Qty: 90 | Fill #2 | Status: AC

## 2020-06-27 MED FILL — PAROXETINE 20 MG TABLET: 90 days supply | Qty: 180 | Fill #0 | Status: AC

## 2020-07-09 ENCOUNTER — Telehealth
Admit: 2020-07-09 | Discharge: 2020-07-10 | Attending: Student in an Organized Health Care Education/Training Program | Primary: Student in an Organized Health Care Education/Training Program

## 2020-07-22 DIAGNOSIS — M2550 Pain in unspecified joint: Principal | ICD-10-CM

## 2020-07-22 DIAGNOSIS — M3501 Sicca syndrome with keratoconjunctivitis: Principal | ICD-10-CM

## 2020-07-22 MED FILL — WIXELA INHUB 500 MCG-50 MCG/DOSE POWDER FOR INHALATION: 30 days supply | Qty: 60 | Fill #1 | Status: AC

## 2020-07-22 MED FILL — WIXELA INHUB 500 MCG-50 MCG/DOSE POWDER FOR INHALATION: RESPIRATORY_TRACT | 30 days supply | Qty: 60 | Fill #1

## 2020-07-23 MED ORDER — HYDROXYCHLOROQUINE 200 MG TABLET
ORAL_TABLET | Freq: Every day | ORAL | 3 refills | 90 days | Status: CP
Start: 2020-07-23 — End: 2021-07-23
  Filled 2020-07-31: qty 90, 90d supply, fill #0

## 2020-07-25 ENCOUNTER — Ambulatory Visit: Admit: 2020-07-25 | Discharge: 2020-07-26 | Attending: Ophthalmology | Primary: Ophthalmology

## 2020-07-25 DIAGNOSIS — M3509 Sicca syndrome with other organ involvement: Principal | ICD-10-CM

## 2020-07-25 DIAGNOSIS — H2513 Age-related nuclear cataract, bilateral: Principal | ICD-10-CM

## 2020-07-29 MED ORDER — ALBUTEROL SULFATE HFA 90 MCG/ACTUATION AEROSOL INHALER
RESPIRATORY_TRACT | 0 refills | 25.00000 days | Status: CP | PRN
Start: 2020-07-29 — End: ?
  Filled 2020-07-31: qty 8.5, 25d supply, fill #0

## 2020-07-31 MED FILL — IPRATROPIUM 0.5 MG-ALBUTEROL 3 MG (2.5 MG BASE)/3 ML NEBULIZATION SOLN: 8 days supply | Qty: 90 | Fill #1 | Status: AC

## 2020-07-31 MED FILL — ALBUTEROL SULFATE HFA 90 MCG/ACTUATION AEROSOL INHALER: 25 days supply | Qty: 8 | Fill #0 | Status: AC

## 2020-07-31 MED FILL — HYDROXYCHLOROQUINE 200 MG TABLET: 90 days supply | Qty: 90 | Fill #0 | Status: AC

## 2020-07-31 MED FILL — IPRATROPIUM 0.5 MG-ALBUTEROL 3 MG (2.5 MG BASE)/3 ML NEBULIZATION SOLN: RESPIRATORY_TRACT | 8 days supply | Qty: 90 | Fill #1

## 2020-08-09 ENCOUNTER — Ambulatory Visit: Admit: 2020-08-09 | Discharge: 2020-08-10 | Attending: Physician Assistant | Primary: Physician Assistant

## 2020-08-12 ENCOUNTER — Encounter: Admit: 2020-08-12 | Discharge: 2020-08-12 | Attending: Anesthesiology | Primary: Anesthesiology

## 2020-08-12 ENCOUNTER — Ambulatory Visit: Admit: 2020-08-12 | Discharge: 2020-08-12

## 2020-08-12 MED ORDER — PREDNISOLONE ACETATE 1 % EYE DROPS,SUSPENSION
0 refills | 0 days | Status: CP
Start: 2020-08-12 — End: ?
  Filled 2020-08-12: qty 5, 25d supply, fill #0

## 2020-08-12 MED ORDER — KETOROLAC 0.5 % EYE DROPS
0 refills | 0 days | Status: CP
Start: 2020-08-12 — End: ?
  Filled 2020-08-12: qty 5, 33d supply, fill #0

## 2020-08-12 MED ORDER — MOXIFLOXACIN 0.5 % EYE DROPS
0 refills | 0 days | Status: CP
Start: 2020-08-12 — End: ?
  Filled 2020-08-12: qty 3, 15d supply, fill #0

## 2020-08-12 MED FILL — KETOROLAC 0.5 % EYE DROPS: 33 days supply | Qty: 5 | Fill #0 | Status: AC

## 2020-08-12 MED FILL — PREDNISOLONE ACETATE 1 % EYE DROPS,SUSPENSION: 25 days supply | Qty: 5 | Fill #0 | Status: AC

## 2020-08-12 MED FILL — VIGAMOX 0.5 % EYE DROPS: 15 days supply | Qty: 3 | Fill #0 | Status: AC

## 2020-08-13 ENCOUNTER — Ambulatory Visit: Admit: 2020-08-13 | Discharge: 2020-08-14 | Attending: Ophthalmology | Primary: Ophthalmology

## 2020-08-13 DIAGNOSIS — Z961 Presence of intraocular lens: Principal | ICD-10-CM

## 2020-08-13 DIAGNOSIS — Z9842 Cataract extraction status, left eye: Principal | ICD-10-CM

## 2020-08-23 ENCOUNTER — Ambulatory Visit: Admit: 2020-08-23 | Discharge: 2020-08-24 | Attending: Physician Assistant | Primary: Physician Assistant

## 2020-08-26 ENCOUNTER — Encounter: Admit: 2020-08-26 | Discharge: 2020-08-26

## 2020-08-26 ENCOUNTER — Ambulatory Visit: Admit: 2020-08-26 | Discharge: 2020-08-26

## 2020-08-26 MED ORDER — MOXIFLOXACIN 0.5 % EYE DROPS
0 refills | 0 days | Status: CP
Start: 2020-08-26 — End: ?
  Filled 2020-08-26: qty 3, 15d supply, fill #0

## 2020-08-26 MED ORDER — KETOROLAC 0.5 % EYE DROPS
Freq: Three times a day (TID) | OPHTHALMIC | 0 refills | 34.00000 days | Status: CP
Start: 2020-08-26 — End: ?
  Filled 2020-08-26: qty 5, 28d supply, fill #0

## 2020-08-26 MED ORDER — PREDNISOLONE ACETATE 1 % EYE DROPS,SUSPENSION
Freq: Four times a day (QID) | TOPICAL | 0 refills | 25 days | Status: CP
Start: 2020-08-26 — End: ?
  Filled 2020-08-26: qty 5, 25d supply, fill #0

## 2020-08-26 MED FILL — PREDNISOLONE ACETATE 1 % EYE DROPS,SUSPENSION: 25 days supply | Qty: 5 | Fill #0 | Status: AC

## 2020-08-26 MED FILL — KETOROLAC 0.5 % EYE DROPS: 28 days supply | Qty: 5 | Fill #0 | Status: AC

## 2020-08-26 MED FILL — VIGAMOX 0.5 % EYE DROPS: 15 days supply | Qty: 3 | Fill #0 | Status: AC

## 2020-08-27 ENCOUNTER — Ambulatory Visit: Admit: 2020-08-27 | Discharge: 2020-08-28

## 2020-08-27 DIAGNOSIS — Z961 Presence of intraocular lens: Secondary | ICD-10-CM

## 2020-08-27 DIAGNOSIS — Z9841 Cataract extraction status, right eye: Secondary | ICD-10-CM

## 2020-08-27 DIAGNOSIS — Z9842 Cataract extraction status, left eye: Principal | ICD-10-CM

## 2020-09-04 ENCOUNTER — Ambulatory Visit: Admit: 2020-09-04 | Discharge: 2020-09-04

## 2020-09-04 ENCOUNTER — Ambulatory Visit: Admit: 2020-09-04 | Discharge: 2020-09-04 | Attending: Internal Medicine | Primary: Internal Medicine

## 2020-09-04 DIAGNOSIS — J449 Chronic obstructive pulmonary disease, unspecified: Principal | ICD-10-CM

## 2020-09-04 DIAGNOSIS — J479 Bronchiectasis, uncomplicated: Principal | ICD-10-CM

## 2020-09-04 DIAGNOSIS — J42 Unspecified chronic bronchitis: Principal | ICD-10-CM

## 2020-09-04 MED ORDER — FLUTICASONE 500 MCG-SALMETEROL 50 MCG/DOSE BLISTR POWDR FOR INHALATION
Freq: Two times a day (BID) | RESPIRATORY_TRACT | 11 refills | 30 days | Status: CP
Start: 2020-09-04 — End: 2021-09-04
  Filled 2020-10-09: qty 60, 30d supply, fill #0

## 2020-09-04 MED ORDER — IPRATROPIUM 0.5 MG-ALBUTEROL 3 MG (2.5 MG BASE)/3 ML NEBULIZATION SOLN
Freq: Four times a day (QID) | RESPIRATORY_TRACT | 3 refills | 8 days | Status: CP | PRN
Start: 2020-09-04 — End: 2021-09-04

## 2020-09-04 MED ORDER — MONTELUKAST 10 MG TABLET
ORAL_TABLET | Freq: Every evening | ORAL | 11 refills | 30 days | Status: CP
Start: 2020-09-04 — End: 2020-10-04
  Filled 2020-09-06: qty 30, 30d supply, fill #0

## 2020-09-04 MED ORDER — SODIUM CHLORIDE 7 % FOR NEBULIZATION
Freq: Two times a day (BID) | RESPIRATORY_TRACT | 11 refills | 30.00000 days | Status: CP
Start: 2020-09-04 — End: 2020-10-04
  Filled 2020-09-06: qty 240, 30d supply, fill #0

## 2020-09-06 MED ORDER — ALBUTEROL SULFATE CONCENTRATE 2.5 MG/0.5 ML SOLUTION FOR NEBULIZATION
Freq: Four times a day (QID) | RESPIRATORY_TRACT | 11 refills | 30 days | Status: CP | PRN
Start: 2020-09-06 — End: 2020-10-06
  Filled 2020-10-09: qty 360, 30d supply, fill #0

## 2020-09-06 MED ORDER — IPRATROPIUM BROMIDE 0.02 % SOLUTION FOR INHALATION
Freq: Four times a day (QID) | RESPIRATORY_TRACT | 12 refills | 8 days | Status: CP
Start: 2020-09-06 — End: 2021-09-06

## 2020-09-06 MED FILL — SODIUM CHLORIDE 7 % FOR NEBULIZATION: 30 days supply | Qty: 240 | Fill #0 | Status: AC

## 2020-09-06 MED FILL — MONTELUKAST 10 MG TABLET: 30 days supply | Qty: 30 | Fill #0 | Status: AC

## 2020-09-06 NOTE — Unmapped (Signed)
Ms. Vannatter sister called on 10/16 to schedule a covid test for bronch on 10/19. COVID test scheduled on 10/16 at 10:30  at the [facility]

## 2020-09-07 ENCOUNTER — Ambulatory Visit: Admit: 2020-09-07 | Discharge: 2020-09-08 | Attending: Physician Assistant | Primary: Physician Assistant

## 2020-09-10 ENCOUNTER — Encounter: Admit: 2020-09-10 | Discharge: 2020-09-11 | Payer: Worker's Compensation

## 2020-09-10 DIAGNOSIS — M3509 Sicca syndrome with other organ involvement: Principal | ICD-10-CM

## 2020-09-10 DIAGNOSIS — Z9842 Cataract extraction status, left eye: Principal | ICD-10-CM

## 2020-09-10 DIAGNOSIS — Z9841 Cataract extraction status, right eye: Principal | ICD-10-CM

## 2020-09-10 DIAGNOSIS — Z79899 Other long term (current) drug therapy: Principal | ICD-10-CM

## 2020-09-10 DIAGNOSIS — Z961 Presence of intraocular lens: Secondary | ICD-10-CM

## 2020-09-11 MED ORDER — PAROXETINE 20 MG TABLET
ORAL_TABLET | Freq: Every morning | ORAL | 0 refills | 90.00000 days | Status: CP
Start: 2020-09-11 — End: ?
  Filled 2020-09-11: qty 180, 90d supply, fill #0

## 2020-09-11 MED FILL — PAROXETINE 20 MG TABLET: 90 days supply | Qty: 180 | Fill #0 | Status: AC

## 2020-09-18 ENCOUNTER — Ambulatory Visit: Admit: 2020-09-18 | Discharge: 2020-09-19 | Attending: Physician Assistant | Primary: Physician Assistant

## 2020-09-20 ENCOUNTER — Ambulatory Visit: Admit: 2020-09-20 | Discharge: 2020-09-20

## 2020-09-24 ENCOUNTER — Ambulatory Visit: Admit: 2020-09-24 | Discharge: 2020-09-25 | Attending: Internal Medicine | Primary: Internal Medicine

## 2020-09-24 DIAGNOSIS — E875 Hyperkalemia: Principal | ICD-10-CM

## 2020-09-24 DIAGNOSIS — A498 Other bacterial infections of unspecified site: Principal | ICD-10-CM

## 2020-09-24 DIAGNOSIS — Z1211 Encounter for screening for malignant neoplasm of colon: Principal | ICD-10-CM

## 2020-09-24 DIAGNOSIS — R5383 Other fatigue: Principal | ICD-10-CM

## 2020-09-24 DIAGNOSIS — T50905A Adverse effect of unspecified drugs, medicaments and biological substances, initial encounter: Principal | ICD-10-CM

## 2020-09-24 DIAGNOSIS — R7989 Other specified abnormal findings of blood chemistry: Principal | ICD-10-CM

## 2020-09-24 DIAGNOSIS — J470 Bronchiectasis with acute lower respiratory infection: Principal | ICD-10-CM

## 2020-09-24 DIAGNOSIS — R269 Unspecified abnormalities of gait and mobility: Principal | ICD-10-CM

## 2020-09-24 DIAGNOSIS — I4581 Long QT syndrome: Principal | ICD-10-CM

## 2020-09-24 DIAGNOSIS — F418 Other specified anxiety disorders: Principal | ICD-10-CM

## 2020-09-24 DIAGNOSIS — Z1231 Encounter for screening mammogram for malignant neoplasm of breast: Principal | ICD-10-CM

## 2020-09-27 ENCOUNTER — Ambulatory Visit: Admit: 2020-09-27 | Discharge: 2020-09-28

## 2020-09-27 DIAGNOSIS — Z9841 Cataract extraction status, right eye: Principal | ICD-10-CM

## 2020-09-27 DIAGNOSIS — Z961 Presence of intraocular lens: Principal | ICD-10-CM

## 2020-09-27 DIAGNOSIS — Z9842 Cataract extraction status, left eye: Principal | ICD-10-CM

## 2020-10-02 DIAGNOSIS — T7840XD Allergy, unspecified, subsequent encounter: Principal | ICD-10-CM

## 2020-10-02 DIAGNOSIS — A498 Other bacterial infections of unspecified site: Principal | ICD-10-CM

## 2020-10-09 ENCOUNTER — Institutional Professional Consult (permissible substitution): Admit: 2020-10-09 | Discharge: 2020-10-09

## 2020-10-09 ENCOUNTER — Ambulatory Visit: Admit: 2020-10-09 | Discharge: 2020-10-09

## 2020-10-09 DIAGNOSIS — J470 Bronchiectasis with acute lower respiratory infection: Principal | ICD-10-CM

## 2020-10-09 DIAGNOSIS — T50905A Adverse effect of unspecified drugs, medicaments and biological substances, initial encounter: Principal | ICD-10-CM

## 2020-10-09 DIAGNOSIS — I4581 Long QT syndrome: Principal | ICD-10-CM

## 2020-10-09 MED FILL — WIXELA INHUB 500 MCG-50 MCG/DOSE POWDER FOR INHALATION: 30 days supply | Qty: 60 | Fill #0 | Status: AC

## 2020-10-09 MED FILL — MONTELUKAST 10 MG TABLET: 30 days supply | Qty: 30 | Fill #1 | Status: AC

## 2020-10-09 MED FILL — ALBUTEROL SULFATE 2.5 MG/3 ML (0.083 %) SOLUTION FOR NEBULIZATION: 30 days supply | Qty: 360 | Fill #0 | Status: AC

## 2020-10-09 MED FILL — MONTELUKAST 10 MG TABLET: ORAL | 30 days supply | Qty: 30 | Fill #1

## 2020-10-10 DIAGNOSIS — J471 Bronchiectasis with (acute) exacerbation: Principal | ICD-10-CM

## 2020-10-10 MED ORDER — CIPROFLOXACIN 750 MG TABLET
ORAL_TABLET | Freq: Two times a day (BID) | ORAL | 0 refills | 14.00000 days | Status: CP
Start: 2020-10-10 — End: 2020-10-25
  Filled 2020-10-11: qty 28, 14d supply, fill #0

## 2020-10-11 MED FILL — CIPROFLOXACIN 750 MG TABLET: 14 days supply | Qty: 28 | Fill #0 | Status: AC

## 2020-10-24 DIAGNOSIS — B372 Candidiasis of skin and nail: Principal | ICD-10-CM

## 2020-10-24 MED ORDER — FLUCONAZOLE 150 MG TABLET
ORAL_TABLET | 0 refills | 0 days | Status: CP
Start: 2020-10-24 — End: 2020-12-27
  Filled 2020-10-25: qty 2, 4d supply, fill #0

## 2020-10-25 MED FILL — FLUCONAZOLE 150 MG TABLET: 4 days supply | Qty: 2 | Fill #0 | Status: AC

## 2020-11-04 MED ORDER — ALBUTEROL SULFATE HFA 90 MCG/ACTUATION AEROSOL INHALER
RESPIRATORY_TRACT | 0 refills | 25.00000 days | Status: CP | PRN
Start: 2020-11-04 — End: ?
  Filled 2020-11-05: qty 8.5, 25d supply, fill #0

## 2020-11-05 MED FILL — MONTELUKAST 10 MG TABLET: 30 days supply | Qty: 30 | Fill #2 | Status: AC

## 2020-11-05 MED FILL — ALBUTEROL SULFATE 2.5 MG/3 ML (0.083 %) SOLUTION FOR NEBULIZATION: 30 days supply | Qty: 360 | Fill #1 | Status: AC

## 2020-11-05 MED FILL — ALBUTEROL SULFATE HFA 90 MCG/ACTUATION AEROSOL INHALER: 25 days supply | Qty: 8 | Fill #0 | Status: AC

## 2020-11-05 MED FILL — MONTELUKAST 10 MG TABLET: ORAL | 30 days supply | Qty: 30 | Fill #2

## 2020-11-05 MED FILL — ALBUTEROL SULFATE 2.5 MG/3 ML (0.083 %) SOLUTION FOR NEBULIZATION: RESPIRATORY_TRACT | 30 days supply | Qty: 360 | Fill #1

## 2020-11-05 MED FILL — WIXELA INHUB 500 MCG-50 MCG/DOSE POWDER FOR INHALATION: 30 days supply | Qty: 60 | Fill #1 | Status: AC

## 2020-11-05 MED FILL — WIXELA INHUB 500 MCG-50 MCG/DOSE POWDER FOR INHALATION: RESPIRATORY_TRACT | 30 days supply | Qty: 60 | Fill #1

## 2020-11-06 MED ORDER — PAROXETINE 20 MG TABLET
ORAL_TABLET | Freq: Every morning | ORAL | 2 refills | 90.00000 days | Status: CP
Start: 2020-11-06 — End: ?
  Filled 2021-01-01: qty 180, 90d supply, fill #0

## 2020-11-14 ENCOUNTER — Ambulatory Visit: Admit: 2020-11-14 | Discharge: 2020-11-15

## 2020-11-14 DIAGNOSIS — Z79899 Other long term (current) drug therapy: Principal | ICD-10-CM

## 2020-12-06 ENCOUNTER — Ambulatory Visit: Admit: 2020-12-06 | Discharge: 2020-12-07

## 2020-12-06 DIAGNOSIS — Z124 Encounter for screening for malignant neoplasm of cervix: Principal | ICD-10-CM

## 2020-12-27 ENCOUNTER — Ambulatory Visit: Admit: 2020-12-27 | Discharge: 2020-12-28

## 2020-12-27 DIAGNOSIS — M2550 Pain in unspecified joint: Principal | ICD-10-CM

## 2020-12-27 DIAGNOSIS — M3501 Sicca syndrome with keratoconjunctivitis: Principal | ICD-10-CM

## 2020-12-27 MED ORDER — HYDROXYCHLOROQUINE 200 MG TABLET
ORAL_TABLET | Freq: Every day | ORAL | 3 refills | 90 days | Status: CP
Start: 2020-12-27 — End: 2021-12-27
  Filled 2021-01-20: qty 90, 90d supply, fill #0

## 2020-12-27 MED ORDER — PILOCARPINE 5 MG TABLET
ORAL_TABLET | Freq: Three times a day (TID) | ORAL | 3 refills | 90.00000 days | Status: CP
Start: 2020-12-27 — End: 2021-12-27
  Filled 2021-03-03: qty 270, 90d supply, fill #0

## 2021-01-01 MED FILL — MONTELUKAST 10 MG TABLET: ORAL | 90 days supply | Qty: 90 | Fill #3

## 2021-01-01 MED FILL — WIXELA INHUB 500 MCG-50 MCG/DOSE POWDER FOR INHALATION: RESPIRATORY_TRACT | 30 days supply | Qty: 60 | Fill #2

## 2021-01-13 ENCOUNTER — Ambulatory Visit: Admit: 2021-01-13 | Discharge: 2021-01-14 | Attending: Internal Medicine | Primary: Internal Medicine

## 2021-01-13 DIAGNOSIS — J449 Chronic obstructive pulmonary disease, unspecified: Principal | ICD-10-CM

## 2021-01-13 DIAGNOSIS — R0600 Dyspnea, unspecified: Principal | ICD-10-CM

## 2021-01-13 MED ORDER — BUDESONIDE-FORMOTEROL HFA 160 MCG-4.5 MCG/ACTUATION AEROSOL INHALER
Freq: Two times a day (BID) | RESPIRATORY_TRACT | 11 refills | 15.00000 days | Status: CP
Start: 2021-01-13 — End: 2022-01-13

## 2021-01-13 MED ORDER — TIOTROPIUM BROMIDE 2.5 MCG/ACTUATION MIST FOR INHALATION
Freq: Every day | RESPIRATORY_TRACT | 11 refills | 0.00000 days | Status: CP
Start: 2021-01-13 — End: 2022-01-13
  Filled 2021-01-20: qty 4, 30d supply, fill #0

## 2021-01-20 MED FILL — BUDESONIDE-FORMOTEROL HFA 160 MCG-4.5 MCG/ACTUATION AEROSOL INHALER: RESPIRATORY_TRACT | 30 days supply | Qty: 10.2 | Fill #0

## 2021-02-06 ENCOUNTER — Ambulatory Visit: Admit: 2021-02-06 | Discharge: 2021-02-07

## 2021-02-07 ENCOUNTER — Ambulatory Visit: Admit: 2021-02-07 | Discharge: 2021-02-08

## 2021-02-10 ENCOUNTER — Ambulatory Visit: Admit: 2021-02-10 | Discharge: 2021-02-11 | Attending: Internal Medicine | Primary: Internal Medicine

## 2021-02-10 DIAGNOSIS — J449 Chronic obstructive pulmonary disease, unspecified: Principal | ICD-10-CM

## 2021-02-26 ENCOUNTER — Ambulatory Visit: Admit: 2021-02-26 | Discharge: 2021-02-27

## 2021-02-28 ENCOUNTER — Ambulatory Visit: Admit: 2021-02-28 | Discharge: 2021-03-01

## 2021-03-04 ENCOUNTER — Ambulatory Visit: Admit: 2021-03-04 | Discharge: 2021-03-05

## 2021-03-19 MED FILL — IPRATROPIUM BROMIDE 0.02 % SOLUTION FOR INHALATION: RESPIRATORY_TRACT | 7 days supply | Qty: 62.5 | Fill #0

## 2021-03-19 MED FILL — SPIRIVA RESPIMAT 2.5 MCG/ACTUATION SOLUTION FOR INHALATION: RESPIRATORY_TRACT | 30 days supply | Qty: 4 | Fill #1

## 2021-03-24 ENCOUNTER — Ambulatory Visit: Admit: 2021-03-24 | Discharge: 2021-03-25

## 2021-03-24 ENCOUNTER — Ambulatory Visit: Admit: 2021-03-24 | Discharge: 2021-03-25 | Attending: Internal Medicine | Primary: Internal Medicine

## 2021-03-24 DIAGNOSIS — J449 Chronic obstructive pulmonary disease, unspecified: Principal | ICD-10-CM

## 2021-03-24 MED ORDER — BUDESONIDE-FORMOTEROL HFA 160 MCG-4.5 MCG/ACTUATION AEROSOL INHALER
Freq: Two times a day (BID) | RESPIRATORY_TRACT | 11 refills | 15 days | Status: CP
Start: 2021-03-24 — End: 2022-03-24

## 2021-03-24 MED ORDER — ALBUTEROL SULFATE HFA 90 MCG/ACTUATION AEROSOL INHALER
RESPIRATORY_TRACT | 11 refills | 25.00000 days | Status: CP | PRN
Start: 2021-03-24 — End: ?
  Filled 2021-05-05: qty 360, 30d supply, fill #2

## 2021-03-25 ENCOUNTER — Ambulatory Visit: Admit: 2021-03-25 | Discharge: 2021-03-26 | Attending: Internal Medicine | Primary: Internal Medicine

## 2021-03-25 DIAGNOSIS — Z1159 Encounter for screening for other viral diseases: Principal | ICD-10-CM

## 2021-03-25 DIAGNOSIS — M3501 Sicca syndrome with keratoconjunctivitis: Principal | ICD-10-CM

## 2021-03-25 DIAGNOSIS — Z114 Encounter for screening for human immunodeficiency virus [HIV]: Principal | ICD-10-CM

## 2021-03-25 DIAGNOSIS — D72819 Decreased white blood cell count, unspecified: Principal | ICD-10-CM

## 2021-03-25 DIAGNOSIS — F419 Anxiety disorder, unspecified: Principal | ICD-10-CM

## 2021-03-25 DIAGNOSIS — Z13220 Encounter for screening for lipoid disorders: Principal | ICD-10-CM

## 2021-03-25 MED ORDER — CITALOPRAM 40 MG TABLET
ORAL_TABLET | Freq: Every day | ORAL | 2 refills | 90 days | Status: CP
Start: 2021-03-25 — End: 2022-03-25
  Filled 2021-03-26: qty 90, 90d supply, fill #0

## 2021-05-01 ENCOUNTER — Ambulatory Visit: Admit: 2021-05-01 | Discharge: 2021-05-02

## 2021-05-01 DIAGNOSIS — B029 Zoster without complications: Principal | ICD-10-CM

## 2021-05-01 MED ORDER — VALACYCLOVIR 500 MG TABLET
ORAL_TABLET | Freq: Three times a day (TID) | ORAL | 0 refills | 7.00000 days | Status: CP
Start: 2021-05-01 — End: 2021-05-01

## 2021-05-01 MED ORDER — TRIAMCINOLONE ACETONIDE 0.1 % TOPICAL CREAM
Freq: Two times a day (BID) | TOPICAL | 0 refills | 0.00000 days | Status: CP
Start: 2021-05-01 — End: 2022-05-01

## 2021-05-05 MED FILL — BUDESONIDE-FORMOTEROL HFA 160 MCG-4.5 MCG/ACTUATION AEROSOL INHALER: RESPIRATORY_TRACT | 15 days supply | Qty: 10.2 | Fill #0

## 2021-05-05 MED FILL — SPIRIVA RESPIMAT 2.5 MCG/ACTUATION SOLUTION FOR INHALATION: RESPIRATORY_TRACT | 30 days supply | Qty: 4 | Fill #2

## 2021-05-23 MED FILL — HYDROXYCHLOROQUINE 200 MG TABLET: ORAL | 90 days supply | Qty: 90 | Fill #1

## 2021-05-27 ENCOUNTER — Ambulatory Visit: Admit: 2021-05-27 | Discharge: 2021-05-28 | Attending: Internal Medicine | Primary: Internal Medicine

## 2021-05-27 DIAGNOSIS — D72819 Decreased white blood cell count, unspecified: Principal | ICD-10-CM

## 2021-05-27 DIAGNOSIS — R61 Generalized hyperhidrosis: Principal | ICD-10-CM

## 2021-05-27 DIAGNOSIS — Z1211 Encounter for screening for malignant neoplasm of colon: Principal | ICD-10-CM

## 2021-06-23 ENCOUNTER — Institutional Professional Consult (permissible substitution): Admit: 2021-06-23 | Discharge: 2021-06-24 | Attending: Clinical | Primary: Clinical

## 2021-06-23 DIAGNOSIS — Z636 Dependent relative needing care at home: Principal | ICD-10-CM

## 2021-06-23 DIAGNOSIS — F419 Anxiety disorder, unspecified: Principal | ICD-10-CM

## 2021-06-23 DIAGNOSIS — Z72820 Sleep deprivation: Principal | ICD-10-CM

## 2021-06-26 ENCOUNTER — Other Ambulatory Visit: Payer: Self-pay

## 2021-06-26 ENCOUNTER — Encounter: Payer: Self-pay | Admitting: Emergency Medicine

## 2021-06-26 ENCOUNTER — Emergency Department: Payer: Medicaid Other

## 2021-06-26 ENCOUNTER — Inpatient Hospital Stay
Admission: EM | Admit: 2021-06-26 | Discharge: 2021-06-30 | DRG: 853 | Disposition: A | Discharge status: Home Health | Payer: Medicaid Other | Attending: Internal Medicine | Admitting: Internal Medicine

## 2021-06-26 DIAGNOSIS — Z419 Encounter for procedure for purposes other than remedying health state, unspecified: Secondary | ICD-10-CM

## 2021-06-26 DIAGNOSIS — F32A Depression, unspecified: Secondary | ICD-10-CM | POA: Diagnosis present

## 2021-06-26 DIAGNOSIS — J180 Bronchopneumonia, unspecified organism: Secondary | ICD-10-CM | POA: Diagnosis present

## 2021-06-26 DIAGNOSIS — D6959 Other secondary thrombocytopenia: Secondary | ICD-10-CM | POA: Diagnosis present

## 2021-06-26 DIAGNOSIS — R0902 Hypoxemia: Secondary | ICD-10-CM | POA: Diagnosis present

## 2021-06-26 DIAGNOSIS — Z87891 Personal history of nicotine dependence: Secondary | ICD-10-CM | POA: Diagnosis not present

## 2021-06-26 DIAGNOSIS — J440 Chronic obstructive pulmonary disease with acute lower respiratory infection: Secondary | ICD-10-CM | POA: Diagnosis present

## 2021-06-26 DIAGNOSIS — M1611 Unilateral primary osteoarthritis, right hip: Secondary | ICD-10-CM | POA: Diagnosis present

## 2021-06-26 DIAGNOSIS — R509 Fever, unspecified: Secondary | ICD-10-CM

## 2021-06-26 DIAGNOSIS — F419 Anxiety disorder, unspecified: Secondary | ICD-10-CM | POA: Diagnosis present

## 2021-06-26 DIAGNOSIS — S72001A Fracture of unspecified part of neck of right femur, initial encounter for closed fracture: Secondary | ICD-10-CM

## 2021-06-26 DIAGNOSIS — Z8781 Personal history of (healed) traumatic fracture: Secondary | ICD-10-CM

## 2021-06-26 DIAGNOSIS — Z20822 Contact with and (suspected) exposure to covid-19: Secondary | ICD-10-CM | POA: Diagnosis present

## 2021-06-26 DIAGNOSIS — W19XXXA Unspecified fall, initial encounter: Secondary | ICD-10-CM

## 2021-06-26 DIAGNOSIS — Y92009 Unspecified place in unspecified non-institutional (private) residence as the place of occurrence of the external cause: Secondary | ICD-10-CM | POA: Diagnosis not present

## 2021-06-26 DIAGNOSIS — W010XXA Fall on same level from slipping, tripping and stumbling without subsequent striking against object, initial encounter: Secondary | ICD-10-CM | POA: Diagnosis present

## 2021-06-26 DIAGNOSIS — D62 Acute posthemorrhagic anemia: Secondary | ICD-10-CM | POA: Diagnosis not present

## 2021-06-26 DIAGNOSIS — N390 Urinary tract infection, site not specified: Secondary | ICD-10-CM | POA: Diagnosis present

## 2021-06-26 DIAGNOSIS — M3500 Sicca syndrome, unspecified: Secondary | ICD-10-CM | POA: Diagnosis present

## 2021-06-26 DIAGNOSIS — Z681 Body mass index (BMI) 19 or less, adult: Secondary | ICD-10-CM

## 2021-06-26 DIAGNOSIS — S72011A Unspecified intracapsular fracture of right femur, initial encounter for closed fracture: Secondary | ICD-10-CM | POA: Diagnosis present

## 2021-06-26 DIAGNOSIS — E43 Unspecified severe protein-calorie malnutrition: Secondary | ICD-10-CM | POA: Diagnosis present

## 2021-06-26 DIAGNOSIS — A419 Sepsis, unspecified organism: Secondary | ICD-10-CM | POA: Diagnosis present

## 2021-06-26 HISTORY — DX: Chronic obstructive pulmonary disease, unspecified: J44.9

## 2021-06-26 HISTORY — DX: Dependence on supplemental oxygen: Z99.81

## 2021-06-26 LAB — APTT: aPTT: 28 seconds (ref 24–36)

## 2021-06-26 LAB — COMPREHENSIVE METABOLIC PANEL
ALT: 22 U/L (ref 0–44)
AST: 23 U/L (ref 15–41)
Albumin: 4 g/dL (ref 3.5–5.0)
Alkaline Phosphatase: 62 U/L (ref 38–126)
Anion gap: 8 (ref 5–15)
BUN: 19 mg/dL (ref 8–23)
CO2: 26 mmol/L (ref 22–32)
Calcium: 9 mg/dL (ref 8.9–10.3)
Chloride: 107 mmol/L (ref 98–111)
Creatinine, Ser: 0.64 mg/dL (ref 0.44–1.00)
GFR, Estimated: >60 mL/min (ref >60)
Glucose, Bld: 99 mg/dL (ref 70–99)
Potassium: 3.6 mmol/L (ref 3.5–5.1)
Sodium: 141 mmol/L (ref 135–145)
Total Bilirubin: 0.8 mg/dL (ref 0.3–1.2)
Total Protein: 7.3 g/dL (ref 6.5–8.1)

## 2021-06-26 LAB — CBC WITH DIFFERENTIAL/PLATELET
Abs Immature Granulocytes: 0.03 10*3/uL (ref 0.00–0.07)
Basophils Absolute: 0 10*3/uL (ref 0.0–0.1)
Basophils Relative: 0 %
Eosinophils Absolute: 0 10*3/uL (ref 0.0–0.5)
Eosinophils Relative: 1 %
HCT: 38.1 % (ref 36.0–46.0)
Hemoglobin: 12.7 g/dL (ref 12.0–15.0)
Immature Granulocytes: 1 %
Lymphocytes Relative: 11 %
Lymphs Abs: 0.7 10*3/uL (ref 0.7–4.0)
MCH: 31.4 pg (ref 26.0–34.0)
MCHC: 33.3 g/dL (ref 30.0–36.0)
MCV: 94.3 fL (ref 80.0–100.0)
Monocytes Absolute: 0.3 10*3/uL (ref 0.1–1.0)
Monocytes Relative: 5 %
Neutro Abs: 4.7 10*3/uL (ref 1.7–7.7)
Neutrophils Relative %: 82 %
Platelets: 102 10*3/uL — ABNORMAL LOW (ref 150–400)
RBC: 4.04 MIL/uL (ref 3.87–5.11)
RDW: 13.2 % (ref 11.5–15.5)
WBC: 5.8 10*3/uL (ref 4.0–10.5)
nRBC: 0 % (ref 0.0–0.2)

## 2021-06-26 LAB — PROTIME-INR
INR: 1.2 (ref 0.8–1.2)
Prothrombin Time: 14.7 seconds (ref 11.4–15.2)

## 2021-06-26 LAB — HIV ANTIBODY (ROUTINE TESTING W REFLEX): HIV Screen 4th Generation wRfx: NONREACTIVE

## 2021-06-26 MED ORDER — OXYCODONE HCL 5 MG PO TABS
5.0000 mg | ORAL_TABLET | ORAL | Status: DC | PRN
  Administered 2021-06-26 – 2021-06-28 (×5): 5 mg via ORAL
  Filled 2021-06-26 (×5): qty 1

## 2021-06-26 MED ORDER — LACTATED RINGERS IV SOLN: INTRAVENOUS | Status: AC

## 2021-06-26 MED ORDER — HYDROMORPHONE HCL 1 MG/ML IJ SOLN
0.5000 mg | INTRAMUSCULAR | Status: DC | PRN
  Administered 2021-06-27: 0.5 mg via INTRAVENOUS
  Filled 2021-06-26: qty 1

## 2021-06-26 MED ORDER — POLYETHYLENE GLYCOL 3350 17 G PO PACK: 17.0000 g | PACK | Freq: Every day | ORAL | Status: DC | PRN

## 2021-06-26 MED ORDER — TRANEXAMIC ACID 1000 MG/10ML IV SOLN
2000.0000 mg | Freq: Once | INTRAVENOUS | Status: DC
  Filled 2021-06-26 (×2): qty 20

## 2021-06-26 MED ORDER — ENOXAPARIN SODIUM 40 MG/0.4ML IJ SOSY
40.0000 mg | PREFILLED_SYRINGE | INTRAMUSCULAR | Status: DC
Start: 2021-06-26 — End: 2021-06-28
  Administered 2021-06-27: 40 mg via SUBCUTANEOUS
  Filled 2021-06-26: qty 0.4

## 2021-06-26 MED ORDER — TRAMADOL-ACETAMINOPHEN 37.5-325 MG PO TABS
1.0000 | ORAL_TABLET | Freq: Two times a day (BID) | ORAL | Status: AC
Start: 2021-06-26 — End: 2021-06-27

## 2021-06-26 MED ORDER — ACETAMINOPHEN 325 MG PO TABS
650.0000 mg | ORAL_TABLET | Freq: Four times a day (QID) | ORAL | Status: DC | PRN
  Administered 2021-06-28 – 2021-06-29 (×3): 650 mg via ORAL
  Filled 2021-06-26 (×3): qty 2

## 2021-06-26 MED ORDER — MORPHINE SULFATE (PF) 4 MG/ML IV SOLN
6.0000 mg | Freq: Once | INTRAVENOUS | Status: AC
  Administered 2021-06-26: 6 mg via INTRAVENOUS
  Filled 2021-06-26: qty 2

## 2021-06-26 MED ORDER — ALBUTEROL SULFATE (2.5 MG/3ML) 0.083% IN NEBU: 3.0000 mL | INHALATION_SOLUTION | Freq: Four times a day (QID) | RESPIRATORY_TRACT | Status: DC | PRN

## 2021-06-26 MED ORDER — ONDANSETRON HCL 4 MG/2ML IJ SOLN
4.0000 mg | Freq: Once | INTRAMUSCULAR | Status: AC
  Administered 2021-06-26: 4 mg via INTRAVENOUS
  Filled 2021-06-26: qty 2

## 2021-06-26 MED ORDER — CEFAZOLIN SODIUM-DEXTROSE 2-4 GM/100ML-% IV SOLN
2.0000 g | INTRAVENOUS | Status: AC
  Administered 2021-06-27: 2 g via INTRAVENOUS

## 2021-06-26 MED ORDER — MELATONIN 5 MG PO TABS
5.0000 mg | ORAL_TABLET | Freq: Every evening | ORAL | Status: DC | PRN
  Administered 2021-06-26 – 2021-06-29 (×4): 5 mg via ORAL
  Filled 2021-06-26 (×4): qty 1

## 2021-06-26 MED ORDER — ONDANSETRON HCL 4 MG/2ML IJ SOLN
4.0000 mg | Freq: Four times a day (QID) | INTRAMUSCULAR | Status: DC | PRN
  Administered 2021-06-27: 4 mg via INTRAVENOUS

## 2021-06-26 MED ORDER — SENNOSIDES-DOCUSATE SODIUM 8.6-50 MG PO TABS
1.0000 | ORAL_TABLET | Freq: Every day | ORAL | Status: DC
  Administered 2021-06-26 – 2021-06-29 (×4): 1 via ORAL
  Filled 2021-06-26 (×4): qty 1

## 2021-06-26 NOTE — Consult Note (Addendum)
Full consult note to follow. Plan a right total hip replacement tomorrow morning if cleared by primary team. NPO after midnight please.   ORTHOPAEDIC CONSULTATION  REQUESTING PHYSICIAN: Max Sane, MD  Chief Complaint: right hip pain  HPI: Shelby Ibarra is a 65 y.o. female who complains of hip pain after slip and fall at home. The pain is sharp in character. The pain is severe and 10/10. The pain is worse with movement and better with rest. Denies any numbness, tingling or constitutional symptoms.  Past Medical History:  Diagnosis Date   Anxiety    Asthma    Cancer ([place])    Cervical cancer ([place]) 1999   COPD (chronic obstructive pulmonary disease) ([place])    Depression    History of home oxygen therapy    2l/ [place]   History reviewed. No pertinent surgical history. Social History   Socioeconomic History   Marital status: Married    Spouse name: Not on file   Number of children: Not on file   Years of education: Not on file   Highest education level: Not on file  Occupational History   Not on file  Tobacco Use   Smoking status: Former    Packs/day: 2.00    Years: 25.00    Pack years: 50.00    Types: Cigarettes    Quit date: 10/22/2009    Years since quitting: 11.6   Smokeless tobacco: Never  Substance and Sexual Activity   Alcohol use: No    Alcohol/week: 0.0 standard drinks   Drug use: No   Sexual activity: Not Currently  Other Topics Concern   Not on file  Social History Narrative   Not on file   Social Determinants of Health   Financial Resource Strain: Not on file  Food Insecurity: Not on file  Transportation Needs: Not on file  Physical Activity: Not on file  Stress: Not on file  Social Connections: Not on file   Family History  Problem Relation Age of Onset   Breast cancer Mother 6       Deceased   Ovarian cancer Mother 73   Breast cancer Maternal Grandmother        approximately 55's   No Known Allergies Prior to Admission medications    Medication Sig Start Date End Date Taking? Authorizing Provider  albuterol (VENTOLIN HFA) 108 (90 Base) MCG/ACT inhaler Inhale 2 puffs into the lungs every 6 (six) hours as needed for wheezing or shortness of breath. 05/09/19  Yes Arta Silence, MD  budesonide-formoterol [facility]) 160-4.5 MCG/ACT inhaler Inhale 2 puffs into the lungs 2 (two) times daily. 03/24/21 03/24/22 Yes [provider]  citalopram (CELEXA) 40 MG tablet Take 40 mg by mouth daily. 03/25/21 03/25/22 Yes [provider]  hydroxychloroquine (PLAQUENIL) 200 MG tablet Take 200 mg by mouth daily. 12/27/20 12/27/21 Yes [provider]  pilocarpine (SALAGEN) 5 MG tablet Take 5 mg by mouth 3 (three) times daily. 12/27/20 12/27/21 Yes [provider]  Tiotropium Bromide Monohydrate 2.5 MCG/ACT AERS Inhale 1 puff into the lungs daily at 12 noon. 01/13/21 01/13/22 Yes [provider]  triamcinolone cream (KENALOG) 0.1 % Apply 1 application topically 2 (two) times daily. Patient not taking: Reported on 06/26/2021 05/01/21   [provider]   DG Hip Unilat W or Wo Pelvis 2-3 Views Right  Result Date: 06/26/2021 CLINICAL DATA:  Fall with injury EXAM: DG HIP (WITH OR WITHOUT PELVIS) 2-3V RIGHT COMPARISON:  None. FINDINGS: SI joints are non widened.  Pubic symphysis and rami appear intact. Acute subcapital right femoral neck fracture with cranial displacement of the distal femur. No femoral head dislocation IMPRESSION: Acute displaced right femoral neck fracture Electronically Signed   By: Donavan Foil M.D.   On: 06/26/2021 15:51    Positive ROS: All other systems have been reviewed and were otherwise negative with the exception of those mentioned in the HPI and as above.  Physical Exam: General: Alert, no acute distress Cardiovascular: No pedal edema Respiratory: No cyanosis, no use of accessory musculature GI: No organomegaly, abdomen is soft and non-tender Skin: No lesions in the area of chief  complaint Neurologic: Sensation intact distally Psychiatric: Patient is competent for consent with normal mood and affect Lymphatic: No axillary or cervical lymphadenopathy  MUSCULOSKELETAL: right leg short, externally rotated. Compartments soft. Good cap refill. Motor and sensory intact distally.  Assessment: Right femoral neck fracture, closed, displaced  Plan: Plan a right total hip replacement.   The diagnosis, risks, benefits and alternatives to treatment are all discussed in detail with the patient and family. Risks include but are not limited to bleeding, infection, deep vein thrombosis, pulmonary embolism, nerve or vascular injury, non-union, repeat operation, persistent pain, weakness, stiffness and death. She understands and is eager to proceed.     Lovell Sheehan, MD    06/27/2021 7:38 AM

## 2021-06-26 NOTE — ED Triage Notes (Signed)
Fell at home.  States slipped in husbands urine and fell, c/o right hip pain.  Right leg externally rotated.  + DP  + CMS.  75 mcg fentanyl given by EMS.

## 2021-06-26 NOTE — Progress Notes (Signed)
Report given to [facility]. Transport arrived and pt being transferred to room 137.  Earleen Reaper, RN

## 2021-06-26 NOTE — ED Notes (Signed)
Pt requesting more pain medication. MD notified.

## 2021-06-26 NOTE — H&P (Signed)
History and Physical  Shelby Ibarra [id number] DOB: [birth year] DOA: 06/26/2021  Referring physician: Dr. Cinda Quest, [place]. PCP: Georga Kaufmann, NP  Outpatient Specialists: Pulmonary Patient coming from: Home.  Chief Complaint: Fall.  HPI: Shelby Ibarra is a 65 y.o. 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  Patient was in her usual state of health prior to this.  She slipped on her husband's urine whom she cares for, he has dementia.  She landed on her right side.  EMS was activated.  She presented to the ED for further evaluation.  Right hip x-ray shows acute displaced right femoral neck fracture.  EDP discussed case with orthopedic surgery who recommended medical admission.  Will plan to operate tomorrow 06/27/2021.  Patient admitted under hospitalist service.  N.p.o. after midnight.  ED Course:  Temperature 97.8.  BP 134/64, pulse 96, respiration rate 19, O2 saturation 98 to 99% on 2 L.  CBC and CMP essentially unremarkable.    Review of Systems: Review of systems as noted in the HPI. All other systems reviewed and are negative.   Past Medical History:  Diagnosis Date   Anxiety    Asthma    Cancer ([place])    Cervical cancer ([place]) 1999   COPD (chronic obstructive pulmonary disease) ([place])    Depression    History of home oxygen therapy    2l/ [place]   No past surgical history on file.  Social History:  reports that she quit smoking about 11 years ago. Her smoking use included cigarettes. She has a 50.00 pack-year smoking history. She has never used smokeless tobacco. She reports that she does not drink alcohol and does not use drugs.   No Known Allergies  Family History  Problem Relation Age of Onset   Breast cancer Mother 16       Deceased   Ovarian cancer Mother 19   Breast cancer Maternal Grandmother        approximately 21's      Prior to Admission medications   Medication Sig  Start Date End Date Taking? Authorizing Provider  albuterol (VENTOLIN HFA) 108 (90 Base) MCG/ACT inhaler Inhale 2 puffs into the lungs every 6 (six) hours as needed for wheezing or shortness of breath. 05/09/19   Arta Silence, MD    Physical Exam: BP 134/64   Pulse 90   Temp 97.8 F (36.6 C) (Oral)   Resp 16   Ht '5\' 11"'$  (1.803 m)   Wt 54.4 kg   LMP 10/22/1998   SpO2 99%   BMI 16.73 kg/m   General: 65 y.o. year-old female well developed well nourished in no acute distress.  Alert and oriented x3. Cardiovascular: Regular rate and rhythm with no rubs or gallops.  No thyromegaly or JVD noted.  No lower extremity edema. 2/4 pulses in all 4 extremities. Respiratory: Clear to auscultation with no wheezes or rales. Good inspiratory effort. Abdomen: Soft nontender nondistended with normal bowel sounds x4 quadrants. Muskuloskeletal: No cyanosis, clubbing or edema noted bilaterally Neuro: CN II-XII intact, strength, sensation, reflexes Skin: No ulcerative lesions noted or rashes Psychiatry: Judgement and insight appear normal. Mood is appropriate for condition and setting          Labs on Admission:  Basic Metabolic Panel: Recent Labs  Lab 06/26/21 1455  NA 141  K 3.6  CL 107  CO2 26  GLUCOSE 99  BUN 19  CREATININE 0.64  CALCIUM 9.0   Liver Function Tests: Recent Labs  Lab 06/26/21 1455  AST 23  ALT 22  ALKPHOS 62  BILITOT 0.8  PROT 7.3  ALBUMIN 4.0   No results for input(s): LIPASE, AMYLASE in the last 168 hours. No results for input(s): AMMONIA in the last 168 hours. CBC: Recent Labs  Lab 06/26/21 1455  WBC 5.8  NEUTROABS 4.7  HGB 12.7  HCT 38.1  MCV 94.3  PLT 102*   Cardiac Enzymes: No results for input(s): CKTOTAL, CKMB, CKMBINDEX, TROPONINI in the last 168 hours.  BNP (last 3 results) No results for input(s): BNP in the last 8760 hours.  ProBNP (last 3 results) No results for input(s): PROBNP in the last 8760 hours.  CBG: No results for  input(s): GLUCAP in the last 168 hours.  Radiological Exams on Admission: DG Hip Unilat W or Wo Pelvis 2-3 Views Right  Result Date: 06/26/2021 CLINICAL DATA:  Fall with injury EXAM: DG HIP (WITH OR WITHOUT PELVIS) 2-3V RIGHT COMPARISON:  None. FINDINGS: SI joints are non widened. Pubic symphysis and rami appear intact. Acute subcapital right femoral neck fracture with cranial displacement of the distal femur. No femoral head dislocation IMPRESSION: Acute displaced right femoral neck fracture Electronically Signed   By: Donavan Foil M.D.   On: 06/26/2021 15:51    EKG: I independently viewed the EKG done and my findings are as followed: Sinus rhythm rate of 88.  Nonspecific ST-T changes.  QTc 440.  Assessment/Plan Present on Admission: **None**  Active Problems:   S/P right hip fracture  Acute displaced right femoral neck fracture post mechanical fall, POA, seen on x-ray. EDP discussed case with orthopedic surgery, will plan for orthopedic surgery tomorrow 06/27/2021. N.p.o. after midnight. Type and screen and INR in the morning Opiate analgesics as needed, IV antiemetics as needed, bowel regimen. Gentle IV fluid hydration LR at 50 cc/h x 2 days.  COPD with chronic hypoxia on 2 L nasal cannula Oxygen requirement is at baseline. O2 saturation 99 to 99% on 2 L. Home bronchodilator as needed  Ambulatory dysfunction in the setting of acute right femoral neck fracture. PT OT assessment under orthopedic surgery's guidance, post surgery, possibly on 06/28/2021. Fall precautions.  Sjogren's syndrome Resume home regimen   DVT prophylaxis: Subcu Lovenox daily  Code Status: Full code  Family Communication: Friend at bedside.  Disposition Plan: Admit to MedSurg unit with remote telemetry.  Consults called: Orthopedic surgery consulted by EDP.  Admission status: Inpatient status.  Patient will require at least 2 midnights for further evaluation and treatment of present  condition.   Status is: Inpatient   Dispo:  Patient From: Home  Planned Disposition: [place], once orthopedic surgery signs off.  Medically stable for discharge: No         Kayleen Memos MD [facility] Pager [phone]  If 7PM-7AM, please contact night-coverage [email or web address] Password [email or web address]  06/26/2021, 5:04 PM

## 2021-06-26 NOTE — ED Provider Notes (Signed)
[facility] Emergency Department Provider Note   ____________________________________________   Event Date/Time   First MD Initiated Contact with Patient 06/26/21 1519     (approximate)  I have reviewed the triage vital signs and the nursing notes.   HISTORY  Chief Complaint Fall    HPI Shelby Ibarra is a 65 y.o. female with past medical history listed below.  She slipped in her husband's urine and fell.  She complains of severe pain in the right hip.  She thinks she might of hit her head on a door jam but does not have a headache and is not nauseated or vomiting and has no history of loss of consciousness.  She has no neck pain.       Past Medical History:  Diagnosis Date   Anxiety    Asthma    Cancer ([place])    Cervical cancer ([place]) 1999   COPD (chronic obstructive pulmonary disease) ([place])    Depression    History of home oxygen therapy    2l/ [place]    There are no problems to display for this patient.   No past surgical history on file.  Prior to Admission medications   Medication Sig Start Date End Date Taking? Authorizing Provider  albuterol (VENTOLIN HFA) 108 (90 Base) MCG/ACT inhaler Inhale 2 puffs into the lungs every 6 (six) hours as needed for wheezing or shortness of breath. 05/09/19   Arta Silence, MD  azithromycin (ZITHROMAX Z-PAK) 250 MG tablet 2 tablets ('500mg'$ ) on day 1, then 1 tablet PO daily x 4 days 05/09/19   Arta Silence, MD    Allergies Patient has no known allergies.  Family History  Problem Relation Age of Onset   Breast cancer Mother 50       Deceased   Ovarian cancer Mother 9   Breast cancer Maternal Grandmother        approximately 93's    Social History Social History   Tobacco Use   Smoking status: Former    Packs/day: 2.00    Years: 25.00    Pack years: 50.00    Types: Cigarettes    Quit date: 10/22/2009    Years since quitting: 11.6   Smokeless tobacco: Never  Substance Use Topics    Alcohol use: No    Alcohol/week: 0.0 standard drinks   Drug use: No    Review of Systems  Constitutional: No fever/chills Eyes: No visual changes. ENT: No sore throat. Cardiovascular: Denies chest pain. Respiratory: Denies shortness of breath. Gastrointestinal: No abdominal pain.  No nausea, no vomiting.  No diarrhea.  No constipation. Genitourinary: Negative for dysuria. Musculoskeletal: Negative for back pain. Skin: Negative for rash. Neurological: Negative for headaches, focal weakness   ____________________________________________   PHYSICAL EXAM:  VITAL SIGNS: ED Triage Vitals [06/26/21 1445]  Enc Vitals Group     BP (!) 142/72     Pulse Rate 97     Resp 19     Temp 97.8 F (36.6 C)     Temp Source Oral     SpO2      Weight 119 lb 14.9 oz (54.4 kg)     Height '5\' 11"'$  (1.803 m)     Head Circumference      Peak Flow      Pain Score 6     Pain Loc      Pain Edu?      Excl. in [place]?     Constitutional: Alert and oriented.  In pain Eyes: Conjunctivae are normal. PER. EOMI. Head: Atraumatic. Nose: No congestion/rhinnorhea. Mouth/Throat: Mucous membranes are moist.  Oropharynx non-erythematous. Neck: No stridor.  No cervical spine tenderness to palpation. ardiovascular: Normal rate, regular rhythm. Grossly normal heart sounds.  Good peripheral circulation. Respiratory: Normal respiratory effort.  No retractions. Lungs CTAB. Gastrointestinal: Soft and nontender. No distention. No abdominal bruits. No CVA tenderness. Musculoskeletal: Right leg is externally rotated hip is very tender.  Patient does have a dorsalis pedis pulse present and equal bilaterally and has normal sensation in her toes Neurologic:  Normal speech and language. No gross focal neurologic deficits are appreciated. No gait instability. Skin:  Skin is warm, dry and intact. No rash noted.   ____________________________________________   LABS (all labs ordered are listed, but only abnormal results  are displayed)  Labs Reviewed  COMPREHENSIVE METABOLIC PANEL  CBC WITH DIFFERENTIAL/PLATELET  PROTIME-INR  APTT  TYPE AND SCREEN   ____________________________________________  EKG EKG read interpreted by me shows normal sinus rhythm rate of 88 normal axis no acute ST-T changes are seen ____________________________________________  RADIOLOGY Gertha Calkin, personally viewed and evaluated these images (plain radiographs) as part of my medical decision making, as well as reviewing the written report by the radiologist.  ED MD interpretation: Right hip has fractured its either subcapital or femoral neck.  Official radiology report(s): DG Hip Unilat W or Wo Pelvis 2-3 Views Right  Result Date: 06/26/2021 CLINICAL DATA:  Fall with injury EXAM: DG HIP (WITH OR WITHOUT PELVIS) 2-3V RIGHT COMPARISON:  None. FINDINGS: SI joints are non widened. Pubic symphysis and rami appear intact. Acute subcapital right femoral neck fracture with cranial displacement of the distal femur. No femoral head dislocation IMPRESSION: Acute displaced right femoral neck fracture Electronically Signed   By: Donavan Foil M.D.   On: 06/26/2021 15:51    ____________________________________________   PROCEDURES  Procedure(s) performed (including Critical Care):  Procedures   ____________________________________________   INITIAL IMPRESSION / ASSESSMENT AND PLAN / ED COURSE  Discussed patient with Dr. Harlow Mares orthopedics.  He will likely do the surgery tomorrow.  I have the hospitalist admit her since she has COPD with a history of using home oxygen.                ____________________________________________   FINAL CLINICAL IMPRESSION(S) / ED DIAGNOSES  Final diagnoses:  Closed fracture of right hip, initial encounter [facility])  Fall, initial encounter     ED Discharge Orders     None        Note:  This document was prepared using Dragon voice recognition software and may include  unintentional dictation errors.    Nena Polio, MD 06/27/21 [phone]

## 2021-06-27 ENCOUNTER — Encounter: Payer: Self-pay | Admitting: Internal Medicine

## 2021-06-27 ENCOUNTER — Inpatient Hospital Stay: Payer: Medicaid Other | Admitting: Anesthesiology

## 2021-06-27 ENCOUNTER — Encounter
Admission: EM | Disposition: A | Discharge status: Home Health | Payer: Self-pay | Source: Home / Self Care | Attending: Internal Medicine

## 2021-06-27 ENCOUNTER — Inpatient Hospital Stay: Payer: Medicaid Other

## 2021-06-27 DIAGNOSIS — J449 Chronic obstructive pulmonary disease, unspecified: Secondary | ICD-10-CM

## 2021-06-27 DIAGNOSIS — D696 Thrombocytopenia, unspecified: Secondary | ICD-10-CM

## 2021-06-27 DIAGNOSIS — Z8781 Personal history of (healed) traumatic fracture: Secondary | ICD-10-CM

## 2021-06-27 DIAGNOSIS — E43 Unspecified severe protein-calorie malnutrition: Secondary | ICD-10-CM | POA: Insufficient documentation

## 2021-06-27 HISTORY — PX: TOTAL HIP ARTHROPLASTY: SHX124

## 2021-06-27 LAB — CBC
HCT: 37.9 % (ref 36.0–46.0)
Hemoglobin: 12.5 g/dL (ref 12.0–15.0)
MCH: 30.9 pg (ref 26.0–34.0)
MCHC: 33 g/dL (ref 30.0–36.0)
MCV: 93.6 fL (ref 80.0–100.0)
Platelets: 89 10*3/uL — ABNORMAL LOW (ref 150–400)
RBC: 4.05 MIL/uL (ref 3.87–5.11)
RDW: 13 % (ref 11.5–15.5)
WBC: 4.9 10*3/uL (ref 4.0–10.5)
nRBC: 0 % (ref 0.0–0.2)

## 2021-06-27 LAB — BASIC METABOLIC PANEL
Anion gap: 7 (ref 5–15)
BUN: 24 mg/dL — ABNORMAL HIGH (ref 8–23)
CO2: 26 mmol/L (ref 22–32)
Calcium: 8.5 mg/dL — ABNORMAL LOW (ref 8.9–10.3)
Chloride: 102 mmol/L (ref 98–111)
Creatinine, Ser: 0.83 mg/dL (ref 0.44–1.00)
GFR, Estimated: >60 mL/min (ref >60)
Glucose, Bld: 89 mg/dL (ref 70–99)
Potassium: 4 mmol/L (ref 3.5–5.1)
Sodium: 135 mmol/L (ref 135–145)

## 2021-06-27 LAB — RESP PANEL BY RT-PCR (FLU A&B, COVID) ARPGX2
Influenza A by PCR: NEGATIVE
Influenza B by PCR: NEGATIVE
SARS Coronavirus 2 by RT PCR: NEGATIVE

## 2021-06-27 LAB — MAGNESIUM: Magnesium: 1.7 mg/dL (ref 1.7–2.4)

## 2021-06-27 LAB — HIV ANTIBODY (ROUTINE TESTING W REFLEX): HIV Screen 4th Generation wRfx: NONREACTIVE

## 2021-06-27 LAB — PHOSPHORUS: Phosphorus: 4.2 mg/dL (ref 2.5–4.6)

## 2021-06-27 LAB — PROTIME-INR
INR: 1.2 (ref 0.8–1.2)
Prothrombin Time: 15.6 seconds — ABNORMAL HIGH (ref 11.4–15.2)

## 2021-06-27 SURGERY — ARTHROPLASTY, HIP, TOTAL, ANTERIOR APPROACH
Anesthesia: Spinal | Site: Hip | Laterality: Right

## 2021-06-27 MED ORDER — ADULT MULTIVITAMIN W/MINERALS CH
1.0000 | ORAL_TABLET | Freq: Every day | ORAL | Status: DC
  Administered 2021-06-28 – 2021-06-30 (×3): 1 via ORAL
  Filled 2021-06-27 (×3): qty 1

## 2021-06-27 MED ORDER — ENSURE ENLIVE PO LIQD
237.0000 mL | Freq: Three times a day (TID) | ORAL | Status: DC
  Administered 2021-06-27 – 2021-06-30 (×8): 237 mL via ORAL

## 2021-06-27 MED ORDER — OXYCODONE HCL 5 MG/5ML PO SOLN: 5.0000 mg | Freq: Once | ORAL | Status: DC | PRN

## 2021-06-27 MED ORDER — BUPIVACAINE-EPINEPHRINE (PF) 0.25% -1:200000 IJ SOLN
INTRAMUSCULAR | Status: DC | PRN
  Administered 2021-06-27: 20 mL

## 2021-06-27 MED ORDER — TRANEXAMIC ACID 1000 MG/10ML IV SOLN
INTRAVENOUS | Status: AC
  Filled 2021-06-27: qty 20

## 2021-06-27 MED ORDER — CEFAZOLIN SODIUM-DEXTROSE 2-4 GM/100ML-% IV SOLN
INTRAVENOUS | Status: AC
  Filled 2021-06-27: qty 100

## 2021-06-27 MED ORDER — EPHEDRINE SULFATE 50 MG/ML IJ SOLN
INTRAMUSCULAR | Status: DC | PRN
  Administered 2021-06-27 (×5): 10 mg via INTRAVENOUS

## 2021-06-27 MED ORDER — LACTATED RINGERS IV SOLN: INTRAVENOUS | Status: DC

## 2021-06-27 MED ORDER — BUPIVACAINE HCL (PF) 0.5 % IJ SOLN
INTRAMUSCULAR | Status: DC | PRN
  Administered 2021-06-27: 2.6 mL via INTRATHECAL

## 2021-06-27 MED ORDER — 0.9 % SODIUM CHLORIDE (POUR BTL) OPTIME
TOPICAL | Status: DC | PRN
  Administered 2021-06-27: 500 mL

## 2021-06-27 MED ORDER — SODIUM CHLORIDE 0.9 % IR SOLN
Status: DC | PRN
  Administered 2021-06-27 (×2): 1000 mL
  Administered 2021-06-27: 3000 mL

## 2021-06-27 MED ORDER — FENTANYL CITRATE (PF) 100 MCG/2ML IJ SOLN
INTRAMUSCULAR | Status: AC
  Filled 2021-06-27: qty 2

## 2021-06-27 MED ORDER — STERILE WATER FOR IRRIGATION IR SOLN
Status: DC | PRN
  Administered 2021-06-27: 1000 mL

## 2021-06-27 MED ORDER — CEFAZOLIN SODIUM-DEXTROSE 1-4 GM/50ML-% IV SOLN
1.0000 g | Freq: Four times a day (QID) | INTRAVENOUS | Status: AC
  Administered 2021-06-27 (×2): 1 g via INTRAVENOUS
  Filled 2021-06-27 (×2): qty 50

## 2021-06-27 MED ORDER — BISACODYL 10 MG RE SUPP: 10.0000 mg | Freq: Every day | RECTAL | Status: DC | PRN

## 2021-06-27 MED ORDER — OXYCODONE HCL 5 MG PO TABS: 5.0000 mg | ORAL_TABLET | Freq: Once | ORAL | Status: DC | PRN

## 2021-06-27 MED ORDER — PHENYLEPHRINE HCL (PRESSORS) 10 MG/ML IV SOLN
INTRAVENOUS | Status: AC
  Filled 2021-06-27: qty 1

## 2021-06-27 MED ORDER — SURGIRINSE WOUND IRRIGATION SYSTEM - OPTIME: TOPICAL | Status: DC | PRN

## 2021-06-27 MED ORDER — ALUM & MAG HYDROXIDE-SIMETH 200-200-20 MG/5ML PO SUSP: 30.0000 mL | ORAL | Status: DC | PRN

## 2021-06-27 MED ORDER — DOCUSATE SODIUM 100 MG PO CAPS
100.0000 mg | ORAL_CAPSULE | Freq: Two times a day (BID) | ORAL | Status: DC
  Administered 2021-06-27 – 2021-06-30 (×6): 100 mg via ORAL
  Filled 2021-06-27 (×6): qty 1

## 2021-06-27 MED ORDER — BUPIVACAINE-EPINEPHRINE (PF) 0.25% -1:200000 IJ SOLN
INTRAMUSCULAR | Status: AC
  Filled 2021-06-27: qty 30

## 2021-06-27 MED ORDER — FENTANYL CITRATE (PF) 100 MCG/2ML IJ SOLN: 25.0000 ug | INTRAMUSCULAR | Status: DC | PRN

## 2021-06-27 MED ORDER — MENTHOL 3 MG MT LOZG
1.0000 | LOZENGE | OROMUCOSAL | Status: DC | PRN
  Filled 2021-06-27: qty 9

## 2021-06-27 MED ORDER — SODIUM CHLORIDE 0.9 % IV SOLN
INTRAVENOUS | Status: DC | PRN
  Administered 2021-06-27: 40 ug/min via INTRAVENOUS
  Administered 2021-06-27: 60 ug/min via INTRAVENOUS
  Administered 2021-06-27: 70 ug/min via INTRAVENOUS

## 2021-06-27 MED ORDER — ASPIRIN 81 MG PO CHEW: 81.0000 mg | CHEWABLE_TABLET | Freq: Two times a day (BID) | ORAL | Status: DC

## 2021-06-27 MED ORDER — MAGNESIUM HYDROXIDE 400 MG/5ML PO SUSP: 30.0000 mL | Freq: Every day | ORAL | Status: DC | PRN

## 2021-06-27 MED ORDER — PROPOFOL 1000 MG/100ML IV EMUL
INTRAVENOUS | Status: AC
  Filled 2021-06-27: qty 100

## 2021-06-27 MED ORDER — METOCLOPRAMIDE HCL 5 MG/ML IJ SOLN: 5.0000 mg | Freq: Three times a day (TID) | INTRAMUSCULAR | Status: DC | PRN

## 2021-06-27 MED ORDER — PROPOFOL 10 MG/ML IV BOLUS
INTRAVENOUS | Status: AC
  Filled 2021-06-27: qty 20

## 2021-06-27 MED ORDER — ONDANSETRON HCL 4 MG/2ML IJ SOLN
4.0000 mg | Freq: Four times a day (QID) | INTRAMUSCULAR | Status: DC | PRN
  Filled 2021-06-27: qty 2

## 2021-06-27 MED ORDER — PHENOL 1.4 % MT LIQD
1.0000 | OROMUCOSAL | Status: DC | PRN
  Filled 2021-06-27: qty 177

## 2021-06-27 MED ORDER — PROPOFOL 500 MG/50ML IV EMUL
INTRAVENOUS | Status: DC | PRN
  Administered 2021-06-27: 25 ug/kg/min via INTRAVENOUS
  Administered 2021-06-27: 20 mg via INTRAVENOUS

## 2021-06-27 MED ORDER — ACETAMINOPHEN 10 MG/ML IV SOLN: 1000.0000 mg | Freq: Once | INTRAVENOUS | Status: DC | PRN

## 2021-06-27 MED ORDER — PHENYLEPHRINE HCL (PRESSORS) 10 MG/ML IV SOLN
INTRAVENOUS | Status: DC | PRN
  Administered 2021-06-27 (×10): 100 ug via INTRAVENOUS

## 2021-06-27 MED ORDER — CHLORHEXIDINE GLUCONATE 0.12 % MT SOLN
OROMUCOSAL | Status: AC
  Administered 2021-06-27: 15 mL
  Filled 2021-06-27: qty 15

## 2021-06-27 MED ORDER — FENTANYL CITRATE (PF) 100 MCG/2ML IJ SOLN
INTRAMUSCULAR | Status: DC | PRN
  Administered 2021-06-27: 50 ug via INTRAVENOUS

## 2021-06-27 MED ORDER — ONDANSETRON HCL 4 MG PO TABS: 4.0000 mg | ORAL_TABLET | Freq: Four times a day (QID) | ORAL | Status: DC | PRN

## 2021-06-27 MED ORDER — TRANEXAMIC ACID 1000 MG/10ML IV SOLN
INTRAVENOUS | Status: DC | PRN
  Administered 2021-06-27: 10000 mg via TOPICAL

## 2021-06-27 MED ORDER — MIDAZOLAM HCL 2 MG/2ML IJ SOLN
INTRAMUSCULAR | Status: AC
  Filled 2021-06-27: qty 2

## 2021-06-27 MED ORDER — TRANEXAMIC ACID-NACL 1000-0.7 MG/100ML-% IV SOLN
INTRAVENOUS | Status: DC | PRN
  Administered 2021-06-27: 1000 mg via INTRAVENOUS

## 2021-06-27 MED ORDER — METOCLOPRAMIDE HCL 10 MG PO TABS: 5.0000 mg | ORAL_TABLET | Freq: Three times a day (TID) | ORAL | Status: DC | PRN

## 2021-06-27 MED ORDER — TRANEXAMIC ACID-NACL 1000-0.7 MG/100ML-% IV SOLN
INTRAVENOUS | Status: AC
  Filled 2021-06-27: qty 100

## 2021-06-27 MED ORDER — MIDAZOLAM HCL 2 MG/2ML IJ SOLN
INTRAMUSCULAR | Status: DC | PRN
  Administered 2021-06-27 (×2): 1 mg via INTRAVENOUS

## 2021-06-27 SURGICAL SUPPLY — 59 items
BLADE SAGITTAL WIDE XTHICK NO (BLADE) ×2 IMPLANT
BNDG COHESIVE 3X5 TAN STRL LF (GAUZE/BANDAGES/DRESSINGS) ×4 IMPLANT
BRUSH SCRUB EZ  4% CHG (MISCELLANEOUS) ×2
BRUSH SCRUB EZ 4% CHG (MISCELLANEOUS) ×2 IMPLANT
CHLORAPREP W/TINT 26 (MISCELLANEOUS) ×2 IMPLANT
COVER HOLE (Hips) ×2 IMPLANT
CUP R3 50MM (Hips) ×2 IMPLANT
DRAPE 3/4 80X56 (DRAPES) ×2 IMPLANT
DRAPE C-ARM 42X72 X-RAY (DRAPES) ×2 IMPLANT
DRAPE STERI IOBAN 125X83 (DRAPES) IMPLANT
DRSG AQUACEL AG ADV 3.5X10 (GAUZE/BANDAGES/DRESSINGS) ×2 IMPLANT
DRSG AQUACEL AG ADV 3.5X14 (GAUZE/BANDAGES/DRESSINGS) IMPLANT
ELECT BLADE 6.5 EXT (BLADE) ×2 IMPLANT
ELECT REM PT RETURN 9FT ADLT (ELECTROSURGICAL) ×2
ELECTRODE REM PT RTRN 9FT ADLT (ELECTROSURGICAL) ×1 IMPLANT
GAUZE 4X4 16PLY ~~LOC~~+RFID DBL (SPONGE) IMPLANT
GAUZE XEROFORM 1X8 LF (GAUZE/BANDAGES/DRESSINGS) ×2 IMPLANT
GLOVE SURG ORTHO LTX SZ8 (GLOVE) ×4 IMPLANT
GLOVE SURG UNDER LTX SZ8 (GLOVE) ×2 IMPLANT
GOWN STRL REUS W/ TWL LRG LVL3 (GOWN DISPOSABLE) ×1 IMPLANT
GOWN STRL REUS W/ TWL XL LVL3 (GOWN DISPOSABLE) ×1 IMPLANT
GOWN STRL REUS W/TWL LRG LVL3 (GOWN DISPOSABLE) ×1
GOWN STRL REUS W/TWL XL LVL3 (GOWN DISPOSABLE) ×1
HANDLE YANKAUER SUCT BULB TIP (MISCELLANEOUS) ×2 IMPLANT
HEAD OXINIUM PLUS 0 32MM (Hips) ×2 IMPLANT
HOOD PEEL AWAY FLYTE STAYCOOL (MISCELLANEOUS) ×6 IMPLANT
IRRIGATION SURGIPHOR STRL (IV SOLUTION) ×2 IMPLANT
IV NS 1000ML (IV SOLUTION) ×2
IV NS 1000ML BAXH (IV SOLUTION) ×2 IMPLANT
KIT PATIENT CARE HANA TABLE (KITS) ×2 IMPLANT
KIT TURNOVER CYSTO (KITS) ×2 IMPLANT
LINER 0 DEG 32X50MM (Hips) ×2 IMPLANT
MANIFOLD NEPTUNE II (INSTRUMENTS) ×2 IMPLANT
MAT ABSORB  FLUID 56X50 GRAY (MISCELLANEOUS) ×1
MAT ABSORB FLUID 56X50 GRAY (MISCELLANEOUS) ×1 IMPLANT
NDL SAFETY ECLIPSE 18X1.5 (NEEDLE) ×2 IMPLANT
NEEDLE HYPO 18GX1.5 SHARP (NEEDLE) ×2
NEEDLE HYPO 22GX1.5 SAFETY (NEEDLE) ×2 IMPLANT
NEEDLE SPNL 20GX3.5 QUINCKE YW (NEEDLE) ×2 IMPLANT
NS IRRIG 500ML POUR BTL (IV SOLUTION) ×2 IMPLANT
PACK HIP PROSTHESIS (MISCELLANEOUS) ×2 IMPLANT
PADDING CAST BLEND 4X4 NS (MISCELLANEOUS) ×4 IMPLANT
PENCIL SMOKE EVACUATOR (MISCELLANEOUS) ×2 IMPLANT
PILLOW ABDUCTION MEDIUM (MISCELLANEOUS) ×2 IMPLANT
PULSAVAC PLUS IRRIG FAN TIP (DISPOSABLE) ×2
SCREW 6.5X25MM (Screw) ×2 IMPLANT
SOL .9 NS 3000ML IRR  AL (IV SOLUTION) ×1
SOL .9 NS 3000ML IRR UROMATIC (IV SOLUTION) ×1 IMPLANT
SPONGE T-LAP 18X18 ~~LOC~~+RFID (SPONGE) ×8 IMPLANT
STAPLER SKIN PROX 35W (STAPLE) ×2 IMPLANT
STEM STD COLLAR SZ3 POLARSTEM (Stem) ×2 IMPLANT
SUT BONE WAX W31G (SUTURE) ×2 IMPLANT
SUT DVC 2 QUILL PDO  T11 36X36 (SUTURE) ×1
SUT DVC 2 QUILL PDO T11 36X36 (SUTURE) ×1 IMPLANT
SUT VIC AB 2-0 CT1 18 (SUTURE) ×2 IMPLANT
SYR 20ML LL LF (SYRINGE) ×2 IMPLANT
TIP FAN IRRIG PULSAVAC PLUS (DISPOSABLE) ×1 IMPLANT
WAND WEREWOLF FASTSEAL 6.0 (MISCELLANEOUS) ×2 IMPLANT
WATER STERILE IRR 1000ML POUR (IV SOLUTION) ×2 IMPLANT

## 2021-06-27 NOTE — Anesthesia Procedure Notes (Signed)
Spinal  Patient location during procedure: OR Start time: 06/27/2021 8:20 AM End time: 06/27/2021 8:24 AM Reason for block: surgical anesthesia Staffing Performed: anesthesiologist  Anesthesiologist: Iran Ouch, MD Preanesthetic Checklist Completed: patient identified, IV checked, site marked, risks and benefits discussed, surgical consent, monitors and equipment checked, pre-op evaluation and timeout performed Spinal Block Patient position: left lateral decubitus Prep: DuraPrep Patient monitoring: heart rate, cardiac monitor, continuous pulse ox and blood pressure Approach: midline Location: L3-4 Injection technique: single-shot Needle Needle type: Sprotte  Needle gauge: 24 G Needle length: 9 cm Assessment Sensory level: T4 Events: CSF return

## 2021-06-27 NOTE — Progress Notes (Addendum)
[facility] [place] NAME: Shelby Ibarra    MR#:  [id number]  PCP: Georga Kaufmann, NP  DATE OF BIRTH:  December 01, 1955  SUBJECTIVE:  CHIEF COMPLAINT:   Chief Complaint  Patient presents with   Fall  Very thirsty and requesting pain medicine. REVIEW OF SYSTEMS:  Review of Systems  Constitutional:  Negative for diaphoresis, fever, malaise/fatigue and weight loss.  HENT:  Negative for ear discharge, ear pain, hearing loss, nosebleeds, sore throat and tinnitus.   Eyes:  Negative for blurred vision and pain.  Respiratory:  Negative for cough, hemoptysis, shortness of breath and wheezing.   Cardiovascular:  Negative for chest pain, palpitations, orthopnea and leg swelling.  Gastrointestinal:  Negative for abdominal pain, blood in stool, constipation, diarrhea, heartburn, nausea and vomiting.  Genitourinary:  Negative for dysuria, frequency and urgency.  Musculoskeletal:  Positive for falls and joint pain. Negative for back pain and myalgias.  Skin:  Negative for itching and rash.  Neurological:  Negative for dizziness, tingling, tremors, focal weakness, seizures, weakness and headaches.  Psychiatric/Behavioral:  Negative for depression. The patient is not nervous/anxious.   DRUG ALLERGIES:  No Known Allergies VITALS:  Blood pressure (!) 97/50, pulse 98, temperature 97.9 F (36.6 C), temperature source Oral, resp. rate 20, height '5\' 8"'$  (1.727 m), weight 54.4 kg, last menstrual period 10/22/1998, SpO2 100 %. PHYSICAL EXAMINATION:  Physical Exam 65 year old malnourished female lying in the bed in some pain from recent surgery earlier today Eyes pupil equal round reactive to light and accommodation, no scleral icterus Lungs clear to auscultation bilaterally, no wheezing rales rhonchi crepitation Cardiovascular S1-S2 normal, no murmur rales or gallop Abdomen soft, benign Neuro alert and oriented, nonfocal Extremities: Dressing in place shows no signs of  infection at the right hip area Skin no rash or lesion Psych normal mood and affect LABORATORY PANEL:  Female CBC Recent Labs  Lab 06/27/21 0559  WBC 4.9  HGB 12.5  HCT 37.9  PLT 89*   ------------------------------------------------------------------------------------------------------------------ Chemistries  Recent Labs  Lab 06/26/21 1455 06/27/21 0559  NA 141 135  K 3.6 4.0  CL 107 102  CO2 26 26  GLUCOSE 99 89  BUN 19 24*  CREATININE 0.64 0.83  CALCIUM 9.0 8.5*  MG  --  1.7  AST 23  --   ALT 22  --   ALKPHOS 62  --   BILITOT 0.8  --    MEDICATIONS:  Scheduled Meds:  aspirin  81 mg Oral BID   docusate sodium  100 mg Oral BID   enoxaparin (LOVENOX) injection  40 mg Subcutaneous Q24H   feeding supplement  237 mL Oral TID BM   multivitamin with minerals  1 tablet Oral Daily   senna-docusate  1 tablet Oral QHS   traMADol-acetaminophen  1 tablet Oral BID   tranexamic acid (CYKLOKAPRON) topical - INTRAOP  2,000 mg Topical Once   Continuous Infusions:   ceFAZolin (ANCEF) IV     lactated ringers 50 mL/hr at 06/27/21 0746   lactated ringers     RADIOLOGY:  DG HIP OPERATIVE UNILAT W OR W/O PELVIS RIGHT  Result Date: 06/27/2021 CLINICAL DATA:  Post RIGHT hip arthroplasty in a patient with history of hip fracture. EXAM: OPERATIVE RIGHT HIP (WITH PELVIS IF PERFORMED) 2 VIEWS TECHNIQUE: Fluoroscopic spot image(s) were submitted for interpretation post-operatively. Fluoroscopy time: 24 seconds. COMPARISON:  Pelvic radiograph of June 26, 2021. FINDINGS: Two intraoperative spot radiographs of  the RIGHT hip with signs of RIGHT hip arthroplasty. No immediate complication identified on AP projection. IMPRESSION: Status post RIGHT hip arthroplasty. Limited intraoperative fluoroscopic assessment without immediate complicating features. Electronically Signed   By: Zetta Bills M.D.   On: 06/27/2021 13:50   DG Hip Unilat W or Wo Pelvis 2-3 Views Right  Result Date:  06/26/2021 CLINICAL DATA:  Fall with injury EXAM: DG HIP (WITH OR WITHOUT PELVIS) 2-3V RIGHT COMPARISON:  None. FINDINGS: SI joints are non widened. Pubic symphysis and rami appear intact. Acute subcapital right femoral neck fracture with cranial displacement of the distal femur. No femoral head dislocation IMPRESSION: Acute displaced right femoral neck fracture Electronically Signed   By: Donavan Foil M.D.   On: 06/26/2021 15:51   ASSESSMENT AND PLAN:  65 y.o. female with medical history significant for COPD on 2 L nasal cannula, Sjogren's syndrome admitted for right femoral neck fracture after a fall at home.    Active Problems:   S/P right hip fracture   Protein-calorie malnutrition, severe  Right femoral neck fracture Status post right total hip replacement by Ortho on 8/5 DVT prophylaxis and pain management per Ortho  Hypotension Asymptomatic, monitor for now  Thrombocytopenia Seems acute on chronic Platelet count of 89 (102) Could be from underlying Sjogren's syndrome.  Monitor closely -May need to hold Lovenox if platelets continue to drop  Severe Protein-calorie Malnutrition Encourage oral nutrition.  Body mass index is 18.24 kg/m.  COPD with chronic hypoxia on 2 L nasal cannula Stable and at baseline  Sjogren's syndrome Follows at [facility] with rheumatology   Net IO Since Admission: 1,492.5 mL [06/27/21 1404]      LOS: 1 day   Consultants: Orthopedics    Antibiotics: Given 1 dose of IV Ancef for OR  Status is: Inpatient  Remains inpatient appropriate because:Unsafe d/c plan  Dispo:  Patient From: Home  Planned Disposition: [place]  Medically stable for discharge: No     DVT prophylaxis:       SCDs Start: 06/27/21 1202 enoxaparin (LOVENOX) injection 40 mg Start: 06/26/21 2200 SCDs Start: 06/26/21 1655     Family Communication: ("discussed with patient") updated patient's daughter Nira Conn over phone on 8/5   All the  records are reviewed and case discussed with Nursing and TOC team. Management plans discussed with the patient, family and they are in agreement.  CODE STATUS: Full Code Level of care: Med-Surg  TOTAL TIME TAKING CARE OF THIS PATIENT: 35 minutes.   More than 50% of the time was spent in counseling/coordination of care: YES  POSSIBLE D/C IN 3-4 DAYS, DEPENDING ON CLINICAL CONDITION.   Max Sane M.D on 06/27/2021 at 2:04 PM  [facility]   CC: Primary care physician; Georga Kaufmann, NP  Note: This dictation was prepared with Dragon dictation along with smaller phrase technology. Any transcriptional errors that result from this process are unintentional.

## 2021-06-27 NOTE — Op Note (Signed)
06/26/2021 - 06/27/2021  10:18 AM  PATIENT:  Shelby Ibarra   MRN: [id number]  PRE-OPERATIVE DIAGNOSIS:  Right femoral neck fracture   POST-OPERATIVE DIAGNOSIS: Same  Procedure: Right Total Hip Replacement  Surgeon: Elyn Aquas. Harlow Mares, MD   Assist: Carlynn Spry, PA-C  Anesthesia: Spinal   EBL: 500 mL   Specimens: None   Drains: None   Components used: A size 3 Polarstem [employer], R3 size 50 mm shell, and a 32 mm +0 mm head    Description of the procedure in detail: After informed consent was obtained and the appropriate extremity marked in the pre-operative holding area, the patient was taken to the operating room and placed in the supine position on the fracture table. All pressure points were well padded and bilateral lower extremities were place in traction spars. The hip was prepped and draped in standard sterile fashion. A spinal anesthetic had been delivered by the anesthesia team. The skin and subcutaneous tissues were injected with a mixture of Marcaine with epinephrine for post-operative pain. A longitudinal incision approximately 10 cm in length was carried out from the anterior superior iliac spine to the greater trochanter. The tensor fascia was divided and blunt dissection was taken down to the level of the joint capsule. The lateral circumflex vessels were cauterized. Deep retractors were placed and a portion of the anterior capsule was excised. Using fluoroscopy the neck cut was planned and carried out with a sagittal saw. The head was passed from the field with use of a corkscrew and hip skid. Deep retractors were placed along the acetabulum and the degenerative labrum and large osteophytes were removed with a Rongeur. The cup was sequentially reamed to a size 50 mm. The wound was irrigated and using fluoroscopy the size 50 mm cup was impacted in to anatomic position. A single screw was placed followed by a threaded hole cover. The final liner was impacted in to position.  Attention was then turned to the proximal femur. The leg was placed in extension and external rotation. The canal was opened and sequentially broached to a size 3. The trial components were placed and the hip relocated. The components were found to be in good position using fluoroscopy. The hip was dislocated and the trial components removed. The final components were impacted in to position and the hip relocated. The final components were again check with fluoroscopy and found to be in good position. Hemostasis was achieved with electrocautery. The deep capsule was injected with Marcaine and epinephrine. The wound was irrigated with bacitracin laced normal saline and the tensor fascia closed with #2 Quill suture. The subcutaneous tissues were closed with 2-0 vicryl and staples for the skin. A sterile dressing was applied and an abduction pillow. Patient tolerated the procedure well and there were no apparent complication. Patient was taken to the recovery room in good condition.   Kurtis Bushman, MD

## 2021-06-27 NOTE — TOC Progression Note (Addendum)
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 I notified [facility] of the need for [facility] PT and OT          Expected Discharge Plan and Services                                                 Social Determinants of Health (SDOH) Interventions    Readmission Risk Interventions No flowsheet data found.

## 2021-06-27 NOTE — Anesthesia Preprocedure Evaluation (Addendum)
Anesthesia Evaluation  Patient identified by MRN, date of birth, ID band Patient awake    Reviewed: Allergy & Precautions, NPO status , Patient's Chart, lab work & pertinent test results  Airway Mallampati: III  TM Distance: >3 FB Neck ROM: full    Dental  (+) Missing,    Pulmonary COPD, former smoker,    Pulmonary exam normal        Cardiovascular negative cardio ROS Normal cardiovascular exam     Neuro/Psych Anxiety Depression negative neurological ROS  negative psych ROS   GI/Hepatic Neg liver ROS, GERD  Controlled,  Endo/Other  negative endocrine ROS  Renal/GU      Musculoskeletal  (+) Arthritis , Osteoarthritis,  Sjorgens   Abdominal   Peds  Hematology negative hematology ROS (+)   Anesthesia Other Findings Past Medical History: No date: Anxiety No date: Asthma No date: Cancer [facility]) 1999: Cervical cancer ([place]) No date: COPD (chronic obstructive pulmonary disease) ([place]) No date: Depression No date: History of home oxygen therapy     Comment:  2l/   History reviewed. No pertinent surgical history.  BMI    Body Mass Index: 16.73 kg/m      Reproductive/Obstetrics negative OB ROS                             Anesthesia Physical Anesthesia Plan  ASA: 3  Anesthesia Plan: Spinal   Post-op Pain Management:    Induction:   PONV Risk Score and Plan: Treatment may vary due to age or medical condition  Airway Management Planned: Nasal Cannula and Natural Airway  Additional Equipment:   Intra-op Plan:   Post-operative Plan:   Informed Consent: I have reviewed the patients History and Physical, chart, labs and discussed the procedure including the risks, benefits and alternatives for the proposed anesthesia with the patient or authorized representative who has indicated his/her understanding and acceptance.     Dental Advisory Given  Plan Discussed with:  Anesthesiologist, CRNA and Surgeon  Anesthesia Plan Comments:         Anesthesia Quick Evaluation

## 2021-06-27 NOTE — Progress Notes (Signed)
Initial Nutrition Assessment  DOCUMENTATION CODES:  Severe malnutrition in context of chronic illness, Severe malnutrition in context of social or environmental circumstances, Underweight  INTERVENTION:  Continue regular diet.  Add Ensure Enlive po TID, each supplement provides 350 kcal and 20 grams of protein.  Add Magic cup TID with meals, each supplement provides 290 kcal and 9 grams of protein.  Add MVI with minerals daily.  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  GOAL:  Patient will meet greater than or equal to 90% of their needs  MONITOR:  PO intake, Supplement acceptance, Labs, Weight trends, Skin, I & O's  REASON FOR ASSESSMENT:  Consult Hip fracture protocol  ASSESSMENT:  65 yo female with a PMH of COPD on 2 L nasal cannula, hx of cervical cancer (1999), anxiety/depression, and Sjogren's syndrome who presents with R femoral neck fracture after a fall at home.  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 Likely neglecting her own health while aiding in his care and with COPD, pt has increased nutrition needs that she has likely not been able to meet.  Pt with very limited weight history. Weight appears to be copied from weight over 2 years ago in 2020. RD to order new weight to determine weight loss.  On exam, severe depletions throughout her body.  Recommend adding Ensure Enlive TID, Magic Cup TID, and MVI with minerals daily.  Medications: reviewed; colace, Senokot, LR @ 50 ml/hr via IV, Dilaudid PRN (given once today), Zofran PRN (given once today)  Labs: reviewed  NUTRITION - FOCUSED PHYSICAL EXAM: Flowsheet Row Most Recent Value  Orbital Region Severe depletion  Upper Arm Region Severe depletion  Thoracic and Lumbar Region Severe depletion  Buccal Region Severe  depletion  Temple Region Severe depletion  Clavicle Bone Region Severe depletion  Clavicle and Acromion Bone Region Severe depletion  Scapular Bone Region Severe depletion  Dorsal Hand Severe depletion  Patellar Region Severe depletion  Anterior Thigh Region Severe depletion  Posterior Calf Region Severe depletion  Edema (RD Assessment) None  Hair Reviewed  Eyes Reviewed  Mouth Reviewed  Skin Reviewed  Nails Reviewed   Diet Order:   Diet Order             Diet regular Room service appropriate? Yes; Fluid consistency: Thin  Diet effective now                  EDUCATION NEEDS:  No education needs have been identified at this time  Skin:  Skin Assessment: Skin Integrity Issues: Skin Integrity Issues:: Incisions Incisions: R hip, closed  Last BM:  PTA/unknown  Height:  Ht Readings from Last 1 Encounters:  06/27/21 '5\' 8"'$  (1.727 m)   Weight:  Wt Readings from Last 1 Encounters:  06/27/21 54.4 kg   BMI:  Body mass index is 18.24 kg/m.  Estimated Nutritional Needs:  Kcal:  2050-2250 Protein:  75-90 grams Fluid:  >2 L  Derrel Nip, RD, LDN (she/her/hers) Registered Dietitian I After-Hours/Weekend Pager # in [place]

## 2021-06-27 NOTE — Discharge Instructions (Signed)

## 2021-06-27 NOTE — Transfer of Care (Signed)
Immediate Anesthesia Transfer of Care Note  Patient: Shelby Ibarra  Procedure(s) Performed: TOTAL HIP ARTHROPLASTY ANTERIOR APPROACH (Right: Hip)  Patient Location: PACU  Anesthesia Type:Spinal  Level of Consciousness: awake  Airway & Oxygen Therapy: Patient Spontanous Breathing  Post-op Assessment: Report given to RN  Post vital signs: stable  Last Vitals:  Vitals Value Taken Time  BP 87/52 06/27/21 1031  Temp 36.8 C 06/27/21 1030  Pulse 89 06/27/21 1037  Resp 21 06/27/21 1037  SpO2 96 % 06/27/21 1037  Vitals shown include unvalidated device data.  Last Pain:  Vitals:   06/27/21 0747  TempSrc: Tympanic  PainSc: 0-No pain      Patients Stated Pain Goal: 0 ([id number])  Complications: No notable events documented.

## 2021-06-28 ENCOUNTER — Inpatient Hospital Stay: Payer: Medicaid Other

## 2021-06-28 DIAGNOSIS — D62 Acute posthemorrhagic anemia: Secondary | ICD-10-CM

## 2021-06-28 LAB — URINALYSIS, COMPLETE (UACMP) WITH MICROSCOPIC
Bilirubin Urine: NEGATIVE
Glucose, UA: NEGATIVE mg/dL
Hgb urine dipstick: NEGATIVE
Ketones, ur: NEGATIVE mg/dL
Nitrite: NEGATIVE
Protein, ur: NEGATIVE mg/dL
Specific Gravity, Urine: 1.032 — ABNORMAL HIGH (ref 1.005–1.030)
pH: 5 (ref 5.0–8.0)

## 2021-06-28 LAB — CBC
HCT: 23.7 % — ABNORMAL LOW (ref 36.0–46.0)
Hemoglobin: 8 g/dL — ABNORMAL LOW (ref 12.0–15.0)
MCH: 31.9 pg (ref 26.0–34.0)
MCHC: 33.8 g/dL (ref 30.0–36.0)
MCV: 94.4 fL (ref 80.0–100.0)
Platelets: 61 10*3/uL — ABNORMAL LOW (ref 150–400)
RBC: 2.51 MIL/uL — ABNORMAL LOW (ref 3.87–5.11)
RDW: 13.1 % (ref 11.5–15.5)
WBC: 4.7 10*3/uL (ref 4.0–10.5)
nRBC: 0 % (ref 0.0–0.2)

## 2021-06-28 LAB — BASIC METABOLIC PANEL
Anion gap: 3 — ABNORMAL LOW (ref 5–15)
BUN: 21 mg/dL (ref 8–23)
CO2: 29 mmol/L (ref 22–32)
Calcium: 8.1 mg/dL — ABNORMAL LOW (ref 8.9–10.3)
Chloride: 101 mmol/L (ref 98–111)
Creatinine, Ser: 0.73 mg/dL (ref 0.44–1.00)
GFR, Estimated: >60 mL/min (ref >60)
Glucose, Bld: 160 mg/dL — ABNORMAL HIGH (ref 70–99)
Potassium: 4.5 mmol/L (ref 3.5–5.1)
Sodium: 133 mmol/L — ABNORMAL LOW (ref 135–145)

## 2021-06-28 LAB — HEMOGLOBIN AND HEMATOCRIT, BLOOD
HCT: 27.4 % — ABNORMAL LOW (ref 36.0–46.0)
Hemoglobin: 9.1 g/dL — ABNORMAL LOW (ref 12.0–15.0)

## 2021-06-28 LAB — PREPARE RBC (CROSSMATCH)

## 2021-06-28 MED ORDER — SODIUM CHLORIDE 0.9% IV SOLUTION: Freq: Once | INTRAVENOUS | Status: AC

## 2021-06-28 NOTE — Progress Notes (Signed)
OT Cancellation Note  Patient Details Name: Shelby Ibarra MRN: [id number] DOB: Oct 30, 1956   Cancelled Treatment:     Pt with low BP, low hemoglobin and low platelet count this date.  Will hold OT this date and recheck status next date and proceed with OT eval as indicated.  Judea Fennimore T Adrien Dietzman, OTR/L, CLT   Jennessy Sandridge 06/28/2021, 4:55 PM

## 2021-06-28 NOTE — Progress Notes (Signed)
Subjective:  Patient reports pain as mild.    Objective:   VITALS:   Vitals:   06/27/21 1932 06/27/21 2352 06/28/21 0428 06/28/21 0651  BP: (!) 96/50 (!) 100/48 (!) 109/58   Pulse: 100 91 (!) 102   Resp: '19 20 20   '$ Temp: 98.7 F (37.1 C) 99 F (37.2 C) 98.9 F (37.2 C)   TempSrc: Oral     SpO2: 100% 100% 98%   Weight:    64 kg  Height:        PHYSICAL EXAM:  Neurologically intact  LABS  Results for orders placed or performed during the hospital encounter of 06/26/21 (from the past 24 hour(s))  CBC     Status: Abnormal   Collection Time: 06/28/21  5:12 AM  Result Value Ref Range   WBC 4.7 4.0 - 10.5 K/uL   RBC 2.51 (L) 3.87 - 5.11 MIL/uL   Hemoglobin 8.0 (L) 12.0 - 15.0 g/dL   HCT 23.7 (L) 36.0 - 46.0 %   MCV 94.4 80.0 - 100.0 fL   MCH 31.9 26.0 - 34.0 pg   MCHC 33.8 30.0 - 36.0 g/dL   RDW 13.1 11.5 - 15.5 %   Platelets 61 (L) 150 - 400 K/uL   nRBC 0.0 0.0 - 0.2 %  Basic metabolic panel     Status: Abnormal   Collection Time: 06/28/21  5:12 AM  Result Value Ref Range   Sodium 133 (L) 135 - 145 mmol/L   Potassium 4.5 3.5 - 5.1 mmol/L   Chloride 101 98 - 111 mmol/L   CO2 29 22 - 32 mmol/L   Glucose, Bld 160 (H) 70 - 99 mg/dL   BUN 21 8 - 23 mg/dL   Creatinine, Ser 0.73 0.44 - 1.00 mg/dL   Calcium 8.1 (L) 8.9 - 10.3 mg/dL   GFR, Estimated >60 >60 mL/min   Anion gap 3 (L) 5 - 15    DG HIP OPERATIVE UNILAT W OR W/O PELVIS RIGHT  Result Date: 06/27/2021 CLINICAL DATA:  Post RIGHT hip arthroplasty in a patient with history of hip fracture. EXAM: OPERATIVE RIGHT HIP (WITH PELVIS IF PERFORMED) 2 VIEWS TECHNIQUE: Fluoroscopic spot image(s) were submitted for interpretation post-operatively. Fluoroscopy time: 24 seconds. COMPARISON:  Pelvic radiograph of June 26, 2021. FINDINGS: Two intraoperative spot radiographs of the RIGHT hip with signs of RIGHT hip arthroplasty. No immediate complication identified on AP projection. IMPRESSION: Status post RIGHT hip  arthroplasty. Limited intraoperative fluoroscopic assessment without immediate complicating features. Electronically Signed   By: Zetta Bills M.D.   On: 06/27/2021 13:50   DG Hip Unilat W or Wo Pelvis 2-3 Views Right  Result Date: 06/26/2021 CLINICAL DATA:  Fall with injury EXAM: DG HIP (WITH OR WITHOUT PELVIS) 2-3V RIGHT COMPARISON:  None. FINDINGS: SI joints are non widened. Pubic symphysis and rami appear intact. Acute subcapital right femoral neck fracture with cranial displacement of the distal femur. No femoral head dislocation IMPRESSION: Acute displaced right femoral neck fracture Electronically Signed   By: Donavan Foil M.D.   On: 06/26/2021 15:51    Assessment/Plan: 1 Day Post-Op   Active Problems:   S/P right hip fracture   Protein-calorie malnutrition, severe   Up with therapy weight bear as tolerated Recommend holding all anticoagulants given low platelets and Hgb to avoid transfusion Plan discharge to home with home health when cleared by PT Return to office in 12 to 14 days for staple removal   Lovell Sheehan , MD 06/28/2021,  7:45 AM

## 2021-06-28 NOTE — Evaluation (Signed)
Physical Therapy Evaluation Patient Details Name: Shelby Ibarra MRN: [id number] DOB: 1956/06/17 Today's Date: 06/28/2021   History of Present Illness  65 y.o. female arrived to ED after a fall (slipped), now s/p R THA. 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  Clinical Impression  Pt received supine in bed, requesting pain meds prior to therapy. History was obtained as daughter was present at this time and asked to participate in this portion of the session. PT then notified RN of pt asking for pain med. PT returned for mobility portion of eval that afternoon. Pt required MAX A to perform bed mobility. MIN A for STS and ambulation. She demo limitations in overall functional mobility, RLE strength, stability and activity tolerance. Daughter reports pt does have decreased stability and functional endurance at home prior to fall. Pt was educated on exercise packet and performed 1 set of each exercise with PT. She would benefit from an additional review of exercises. She dies need to perform 1 step in order to enter home or daughter to be taught how to bump w/c up step. Would benefit from skilled PT to address above deficits and promote optimal return to PLOF.     Follow Up Recommendations SNF;Home health PT;Supervision/Assistance - 24 hour (no insurance, please set up HHPT to d/c home)    Equipment Recommendations  3in1 (PT)    Recommendations for Other Services       Precautions / Restrictions Precautions Precautions: Anterior Hip;Fall Precaution Booklet Issued: Yes (comment) Restrictions Weight Bearing Restrictions: Yes RLE Weight Bearing: Weight bearing as tolerated      Mobility  Bed Mobility Overal bed mobility: Needs Assistance Bed Mobility: Supine to Sit     Supine to sit: Max assist     General bed mobility comments: to manage BLE and trunk, pt did assist with lifting trunk and managing LLE with PT  encouragement    Transfers Overall transfer level: Needs assistance Equipment used: Rolling walker (2 wheeled) Transfers: Sit to/from [facility] Sit to Stand: Mod assist;Min assist         General transfer comment: STS: MIN A to steady during transitioning of hands to RW. Stand pivot: MIN A for increased steadying and managing RW. VC to align hips to chair and step back prior to sitting down  Ambulation/Gait             General Gait Details: deferred due to pain  Stairs            Wheelchair Mobility    Modified Rankin (Stroke Patients Only)       Balance Overall balance assessment: Needs assistance Sitting-balance support: Feet supported;No upper extremity supported Sitting balance-Leahy Scale: Fair   Postural control: Left lateral lean Standing balance support: Bilateral upper extremity supported;During functional activity Standing balance-Leahy Scale: Poor Standing balance comment: using RW and CGA to steady, VC to place RLE under hip to allow some weight bearing                             Pertinent Vitals/Pain Pain Assessment: 0-10 Pain Score: 5  Pain Descriptors / Indicators: Sharp Pain Intervention(s): Limited activity within patient's tolerance;Monitored during session;Premedicated before session    Home Living Family/patient expects to be discharged to:: Private residence Living Arrangements: Spouse/significant other (husband has dementia) Available Help at Discharge: Family;Available 24 hours/day Type of Home: House Home Access: Stairs to enter  Entrance Stairs-Rails: None Entrance Stairs-Number of Steps: 1 Home Layout: One level Home Equipment: Walker - 2 wheels;Wheelchair - manual Additional Comments: uses RW at home, w/c for longer distances. 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 Daughter is available to care for both parents upon pt discharge    Prior Function Level of Independence:  Needs assistance   Gait / Transfers Assistance Needed: Mod I however unsteady according to daughter using RW  ADL's / Homemaking Assistance Needed: Mod I with ADLs, daughter assists with IADLs        Hand Dominance   Dominant Hand: Right    Extremity/Trunk Assessment   Upper Extremity Assessment Upper Extremity Assessment: Overall WFL for tasks assessed    Lower Extremity Assessment Lower Extremity Assessment: RLE deficits/detail RLE Deficits / Details: s/p THA, LLE WFL RLE: Unable to fully assess due to pain       Communication   Communication: No difficulties  Cognition Arousal/Alertness: Awake/alert Behavior During Therapy: WFL for tasks assessed/performed Overall Cognitive Status: Within Functional Limits for tasks assessed                                 General Comments: A&Ox4      General Comments      Exercises Total Joint Exercises Ankle Circles/Pumps: AROM;Both;15 reps Quad Sets: AROM;Both;10 reps Gluteal Sets: AROM;Both;10 reps Towel Squeeze: AROM;Both;10 reps Short Arc QuadSinclair Ship;Right;10 reps Heel Slides: AAROM;Right;10 reps Hip ABduction/ADduction: AAROM;Right;10 reps Straight Leg Raises: AAROM;Right;10 reps   Assessment/Plan    PT Assessment Patient needs continued PT services  PT Problem List Decreased strength;Decreased range of motion;Decreased activity tolerance;Decreased safety awareness;Decreased balance;Decreased knowledge of precautions;Decreased mobility       PT Treatment Interventions DME instruction;Balance training;Gait training;Neuromuscular re-education;Stair training;Functional mobility training;Patient/family education;Therapeutic activities;Therapeutic exercise    PT Goals (Current goals can be found in the Care Plan section)  Acute Rehab PT Goals Patient Stated Goal: to go home PT Goal Formulation: With patient/family Time For Goal Achievement: 07/12/21 Potential to Achieve Goals: Fair    Frequency  BID   Barriers to discharge        Co-evaluation               AM-PAC PT "6 Clicks" Mobility  Outcome Measure Help needed turning from your back to your side while in a flat bed without using bedrails?: A Lot Help needed moving from lying on your back to sitting on the side of a flat bed without using bedrails?: A Lot Help needed moving to and from a bed to a chair (including a wheelchair)?: A Little Help needed standing up from a chair using your arms (e.g., wheelchair or bedside chair)?: A Little Help needed to walk in hospital room?: A Lot Help needed climbing 3-5 steps with a railing? : A Lot 6 Click Score: 14    End of Session Equipment Utilized During Treatment: Gait belt Activity Tolerance: Patient tolerated treatment well;Patient limited by fatigue Patient left: in chair;with chair alarm set;with call bell/phone within reach Nurse Communication: Mobility status PT Visit Diagnosis: Unsteadiness on feet (R26.81);Other abnormalities of gait and mobility (R26.89);Muscle weakness (generalized) (M62.81);Difficulty in walking, not elsewhere classified (R26.2)    Time: [id number] PT Time Calculation (min) (ACUTE ONLY): 50 min   Charges:   PT Evaluation $PT Eval Low Complexity: 1 Low PT Treatments $Therapeutic Exercise: 8-22 mins $Therapeutic Activity: 8-22 mins       Patrina Levering PT,  DPT 06/28/21 6:32 PM [id number]   Ramonita Lab 06/28/2021, 6:27 PM

## 2021-06-28 NOTE — Progress Notes (Addendum)
[place] [facility] [place] NAME: Shelby Ibarra    MR#:  [id number]  PCP: Georga Kaufmann, NP  DATE OF BIRTH:  May 09, 1956  SUBJECTIVE:  CHIEF COMPLAINT:   Chief Complaint  Patient presents with   Fall  Blood pressure remains low, hemoglobin and platelets dropped.  Patient denies any new symptoms REVIEW OF SYSTEMS:  Review of Systems  Constitutional:  Negative for diaphoresis, fever, malaise/fatigue and weight loss.  HENT:  Negative for ear discharge, ear pain, hearing loss, nosebleeds, sore throat and tinnitus.   Eyes:  Negative for blurred vision and pain.  Respiratory:  Negative for cough, hemoptysis, shortness of breath and wheezing.   Cardiovascular:  Negative for chest pain, palpitations, orthopnea and leg swelling.  Gastrointestinal:  Negative for abdominal pain, blood in stool, constipation, diarrhea, heartburn, nausea and vomiting.  Genitourinary:  Negative for dysuria, frequency and urgency.  Musculoskeletal:  Positive for falls and joint pain. Negative for back pain and myalgias.  Skin:  Negative for itching and rash.  Neurological:  Negative for dizziness, tingling, tremors, focal weakness, seizures, weakness and headaches.  Psychiatric/Behavioral:  Negative for depression. The patient is not nervous/anxious.   DRUG ALLERGIES:  No Known Allergies VITALS:  Blood pressure (!) 95/46, pulse 100, temperature 99.2 F (37.3 C), temperature source Oral, resp. rate 20, height '5\' 8"'$  (1.727 m), weight 64 kg, last menstrual period 10/22/1998, SpO2 100 %. PHYSICAL EXAMINATION:  Physical Exam 65 year old malnourished female lying in the bed in some pain from recent surgery earlier today Eyes pupil equal round reactive to light and accommodation, pale sclera Lungs clear to auscultation bilaterally, no wheezing rales rhonchi crepitation Cardiovascular S1-S2 normal, no murmur rales or gallop Abdomen soft, benign Neuro alert and oriented,  nonfocal Extremities: Dressing in place shows no signs of infection at the right hip area Skin no rash or lesion.  Skin pallor present Psych normal mood and affect LABORATORY PANEL:  Female CBC Recent Labs  Lab 06/28/21 0512  WBC 4.7  HGB 8.0*  HCT 23.7*  PLT 61*    ------------------------------------------------------------------------------------------------------------------ Chemistries  Recent Labs  Lab 06/26/21 1455 06/27/21 0559 06/28/21 0512  NA 141 135 133*  K 3.6 4.0 4.5  CL 107 102 101  CO2 '26 26 29  '$ GLUCOSE 99 89 160*  BUN 19 24* 21  CREATININE 0.64 0.83 0.73  CALCIUM 9.0 8.5* 8.1*  MG  --  1.7  --   AST 23  --   --   ALT 22  --   --   ALKPHOS 62  --   --   BILITOT 0.8  --   --     MEDICATIONS:  Scheduled Meds:  docusate sodium  100 mg Oral BID   feeding supplement  237 mL Oral TID BM   multivitamin with minerals  1 tablet Oral Daily   senna-docusate  1 tablet Oral QHS   tranexamic acid (CYKLOKAPRON) topical - INTRAOP  2,000 mg Topical Once   Continuous Infusions:  lactated ringers 50 mL/hr at 06/27/21 0746   lactated ringers 100 mL/hr at 06/28/21 [id number]   RADIOLOGY:  [facility] Chest Port 1 View  Result Date: 06/28/2021 CLINICAL DATA:  Recent hip surgery.  Fever. EXAM: PORTABLE CHEST 1 VIEW COMPARISON:  05/09/2019 FINDINGS: Chronic reticulonodular opacity which is diffuse. No superimposed edema or consolidation seen. Normal heart size and mediastinal contours. IMPRESSION: Chronic lung disease. No visible pneumonia or focal atelectasis when compared to 2020  radiograph. Electronically Signed   By: Monte Fantasia M.D.   On: 06/28/2021 08:48   ASSESSMENT AND PLAN:  65 y.o. female with medical history significant for COPD on 2 L nasal cannula, Sjogren's syndrome admitted for right femoral neck fracture after a fall at home.    Active Problems:   S/P right hip fracture   Protein-calorie malnutrition, severe  Acute blood loss anemia Hemoglobin 12.5> 8  Right  femoral neck fracture Status post right total hip replacement by Ortho on 8/5 DVT prophylaxis and pain management per Ortho  Hypotension Asymptomatic, monitor for now Increase IV Ringer lactate at 100  Thrombocytopenia Seems acute on chronic Platelet count of 102> 89> 61 Likely from underlying Sjogren's syndrome.  -Hold all antiplatelets or blood thinners and monitor -Consider oncology consult if further drop in counts  Severe Protein-calorie Malnutrition Encourage oral nutrition.  Body mass index is 18.24 kg/m.  COPD with chronic hypoxia on 2 L nasal cannula Stable and at baseline  Sjogren's syndrome Follows at [facility] with [facility]ology   Net IO Since Admission: 1,692.5 mL [06/28/21 1107]      LOS: 2 days   Consultants: Orthopedics    Antibiotics: Given 1 dose of IV Ancef for OR  Status is: Inpatient  Remains inpatient appropriate because:Unsafe d/c plan her hemoglobin and platelets are low today  Dispo:  Patient From: Home  Planned Disposition: Home with home health  Medically stable for discharge: No     DVT prophylaxis:       SCDs Start: 06/27/21 1202 SCDs Start: 06/26/21 1655     Family Communication: updated patient's daughter Nira Conn over phone on 8/5   All the records are reviewed and case discussed with Nursing and TOC team. Management plans discussed with the patient, family and they are in agreement.  CODE STATUS: Full Code Level of care: Med-Surg  TOTAL TIME TAKING CARE OF THIS PATIENT: 35 minutes.   More than 50% of the time was spent in counseling/coordination of care: YES  POSSIBLE D/C IN 2-3 DAYS, DEPENDING ON CLINICAL CONDITION.   Max Sane M.D on 06/28/2021 at 11:07 AM  [facility]   CC: Primary care physician; Georga Kaufmann, NP  Note: This dictation was prepared with Dragon dictation along with smaller phrase technology. Any transcriptional errors that result from this process are unintentional.

## 2021-06-29 ENCOUNTER — Inpatient Hospital Stay: Payer: Medicaid Other

## 2021-06-29 DIAGNOSIS — A419 Sepsis, unspecified organism: Principal | ICD-10-CM

## 2021-06-29 DIAGNOSIS — N390 Urinary tract infection, site not specified: Secondary | ICD-10-CM

## 2021-06-29 LAB — PROTIME-INR
INR: 1.4 — ABNORMAL HIGH (ref 0.8–1.2)
Prothrombin Time: 16.7 seconds — ABNORMAL HIGH (ref 11.4–15.2)

## 2021-06-29 LAB — BASIC METABOLIC PANEL
Anion gap: 6 (ref 5–15)
BUN: 11 mg/dL (ref 8–23)
CO2: 28 mmol/L (ref 22–32)
Calcium: 7.6 mg/dL — ABNORMAL LOW (ref 8.9–10.3)
Chloride: 104 mmol/L (ref 98–111)
Creatinine, Ser: 0.65 mg/dL (ref 0.44–1.00)
GFR, Estimated: >60 mL/min (ref >60)
Glucose, Bld: 115 mg/dL — ABNORMAL HIGH (ref 70–99)
Potassium: 3.9 mmol/L (ref 3.5–5.1)
Sodium: 138 mmol/L (ref 135–145)

## 2021-06-29 LAB — LACTIC ACID, PLASMA
Lactic Acid, Venous: 0.7 mmol/L (ref 0.5–1.9)
Lactic Acid, Venous: 1 mmol/L (ref 0.5–1.9)

## 2021-06-29 LAB — CBC
HCT: 29.8 % — ABNORMAL LOW (ref 36.0–46.0)
Hemoglobin: 9.7 g/dL — ABNORMAL LOW (ref 12.0–15.0)
MCH: 30.8 pg (ref 26.0–34.0)
MCHC: 32.6 g/dL (ref 30.0–36.0)
MCV: 94.6 fL (ref 80.0–100.0)
Platelets: 58 10*3/uL — ABNORMAL LOW (ref 150–400)
RBC: 3.15 MIL/uL — ABNORMAL LOW (ref 3.87–5.11)
RDW: 13.2 % (ref 11.5–15.5)
WBC: 3.4 10*3/uL — ABNORMAL LOW (ref 4.0–10.5)
nRBC: 0 % (ref 0.0–0.2)

## 2021-06-29 LAB — APTT: aPTT: 36 seconds (ref 24–36)

## 2021-06-29 MED ORDER — AZITHROMYCIN 250 MG PO TABS
250.0000 mg | ORAL_TABLET | Freq: Every day | ORAL | Status: DC
  Administered 2021-06-30: 250 mg via ORAL
  Filled 2021-06-29: qty 1

## 2021-06-29 MED ORDER — SODIUM CHLORIDE 0.9 % IV SOLN: INTRAVENOUS | Status: DC

## 2021-06-29 MED ORDER — SODIUM CHLORIDE 0.9 % IV BOLUS (SEPSIS)
1000.0000 mL | Freq: Once | INTRAVENOUS | Status: AC
  Administered 2021-06-29: 1000 mL via INTRAVENOUS

## 2021-06-29 MED ORDER — PILOCARPINE HCL 5 MG PO TABS
5.0000 mg | ORAL_TABLET | Freq: Three times a day (TID) | ORAL | Status: DC
  Administered 2021-06-29 – 2021-06-30 (×4): 5 mg via ORAL
  Filled 2021-06-29 (×6): qty 1

## 2021-06-29 MED ORDER — TIOTROPIUM BROMIDE MONOHYDRATE 18 MCG IN CAPS
1.0000 | ORAL_CAPSULE | Freq: Every day | RESPIRATORY_TRACT | Status: DC
  Filled 2021-06-29: qty 5

## 2021-06-29 MED ORDER — CITALOPRAM HYDROBROMIDE 20 MG PO TABS
40.0000 mg | ORAL_TABLET | Freq: Every day | ORAL | Status: DC
  Administered 2021-06-29 – 2021-06-30 (×2): 40 mg via ORAL
  Filled 2021-06-29 (×2): qty 2

## 2021-06-29 MED ORDER — SODIUM CHLORIDE 0.9 % IV SOLN
1.0000 g | INTRAVENOUS | Status: DC
  Administered 2021-06-29 – 2021-06-30 (×2): 1 g via INTRAVENOUS
  Filled 2021-06-29: qty 10
  Filled 2021-06-29 (×2): qty 1

## 2021-06-29 MED ORDER — TIOTROPIUM BROMIDE MONOHYDRATE 18 MCG IN CAPS
18.0000 ug | ORAL_CAPSULE | Freq: Every day | RESPIRATORY_TRACT | Status: DC
  Administered 2021-06-29 – 2021-06-30 (×2): 18 ug via RESPIRATORY_TRACT
  Filled 2021-06-29: qty 5

## 2021-06-29 MED ORDER — AZITHROMYCIN 500 MG PO TABS
500.0000 mg | ORAL_TABLET | Freq: Every day | ORAL | Status: AC
  Administered 2021-06-29: 500 mg via ORAL
  Filled 2021-06-29: qty 1

## 2021-06-29 NOTE — Progress Notes (Signed)
   06/29/21 0725  Assess: MEWS Score  Temp 99.6 F (37.6 C)  BP (!) 99/55  Pulse Rate (!) 101  Resp 14  SpO2 99 %  O2 Device Nasal Cannula  Assess: MEWS Score  MEWS Temp 0  MEWS Systolic 1  MEWS Pulse 1  MEWS RR 0  MEWS LOC 0  MEWS Score 2  MEWS Score Color Yellow  Assess: if the MEWS score is Yellow or Red  Were vital signs taken at a resting state? Yes  Focused Assessment No change from prior assessment  Does the patient meet 2 or more of the SIRS criteria? Yes  Does the patient have a confirmed or suspected source of infection? No  MEWS guidelines implemented *See Row Information* No, previously yellow, continue vital signs every 4 hours  Document  Progress note created (see row info) Yes  Assess: SIRS CRITERIA  SIRS Temperature  0  SIRS Pulse 1  SIRS Respirations  0  SIRS WBC 0  SIRS Score Sum  1

## 2021-06-29 NOTE — Sepsis Progress Note (Signed)
Verified with oncoming RN that antibiotics were infusing when blood cultures were drawn

## 2021-06-29 NOTE — Progress Notes (Signed)
Pt being followed by Elink for Sepsis protocol.

## 2021-06-29 NOTE — Progress Notes (Signed)
Physical Therapy Treatment Patient Details Name: EZEKIEL Ibarra MRN: [id number] DOB: April 26, 1956 Today's Date: 06/29/2021    History of Present Illness 65 y.o. female arrived to ED after a fall (slipped), now s/p R THA. 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    PT Comments    Pt seen for PT tx with pt agreeable to session but does endorse RLE pain that increases with movement. Pt requires min assist for bed mobility with heavy reliance on bed rails & HOB elevated. Pt is able to complete sit>stand & ambulate to door & back with RW & min assist with pt demonstrating impaired gait pattern as noted below. Pt would benefit from continued skilled PT tx to progress gait, RLE strengthening, endurance & activity tolerance.     Follow Up Recommendations  SNF;Home health PT;Supervision/Assistance - 24 hour (no insurance, please set up with HHPT services)     Equipment Recommendations  3in1 (PT);Rolling walker with 5" wheels    Recommendations for Other Services       Precautions / Restrictions Precautions Precautions: Anterior Hip;Fall Restrictions Weight Bearing Restrictions: Yes RLE Weight Bearing: Weight bearing as tolerated    Mobility  Bed Mobility Overal bed mobility: Needs Assistance Bed Mobility: Supine to Sit     Supine to sit: Min assist;HOB elevated     General bed mobility comments: assistance to move RLE to EOB, extra time & cuing for technique with heavy reliance on bed rails & HOB elevated    Transfers Overall transfer level: Needs assistance Equipment used: Rolling walker (2 wheeled) Transfers: Sit to/from Stand Sit to Stand: Min assist         General transfer comment: cuing for hand placement & technique  Ambulation/Gait Ambulation/Gait assistance: Min assist Gait Distance (Feet): 25 Feet Assistive device: Rolling walker (2 wheeled) Gait Pattern/deviations: Decreased step length -  right;Decreased step length - left;Decreased stride length;Decreased dorsiflexion - right;Decreased weight shift to right Gait velocity: decreased   General Gait Details: RLE genu valgum, does not fully extend R knee in stance phase, maintains B hips/knees flexed during standing/gait   Stairs             Wheelchair Mobility    Modified Rankin (Stroke Patients Only)       Balance Overall balance assessment: Needs assistance Sitting-balance support: Bilateral upper extremity supported;Feet supported Sitting balance-Leahy Scale: Fair     Standing balance support: Bilateral upper extremity supported;During functional activity Standing balance-Leahy Scale: Poor Standing balance comment: BUE support on RW & min assist                            Cognition Arousal/Alertness: Awake/alert Behavior During Therapy: WFL for tasks assessed/performed Overall Cognitive Status: Within Functional Limits for tasks assessed                                 General Comments: A&Ox4      Exercises General Exercises - Lower Extremity Long Arc Quad: AROM;Strengthening;Right;10 reps;Seated    General Comments General comments (skin integrity, edema, etc.): Pt on 2L/min via nasal cannula, SPO2 >90%, HR 113 bpm after gait, BP in LUE 101/53 mmHg (MAP 68) sitting in recliner after agit      Pertinent Vitals/Pain Pain Assessment: Faces Faces Pain Scale: Hurts even more Pain Location: L hip Pain Descriptors / Indicators: Grimacing;Discomfort  Pain Intervention(s): Limited activity within patient's tolerance;Monitored during session;Repositioned    Home Living                      Prior Function            PT Goals (current goals can now be found in the care plan section) Acute Rehab PT Goals Patient Stated Goal: to go home PT Goal Formulation: With patient/family Time For Goal Achievement: 07/12/21 Potential to Achieve Goals: Fair Progress towards  PT goals: Progressing toward goals    Frequency    BID      PT Plan Current plan remains appropriate    Co-evaluation              AM-PAC PT "6 Clicks" Mobility   Outcome Measure  Help needed turning from your back to your side while in a flat bed without using bedrails?: A Little Help needed moving from lying on your back to sitting on the side of a flat bed without using bedrails?: A Lot Help needed moving to and from a bed to a chair (including a wheelchair)?: A Little Help needed standing up from a chair using your arms (e.g., wheelchair or bedside chair)?: A Little Help needed to walk in hospital room?: A Little Help needed climbing 3-5 steps with a railing? : A Lot 6 Click Score: 16    End of Session Equipment Utilized During Treatment: Gait belt;Oxygen Activity Tolerance: Patient tolerated treatment well;Patient limited by fatigue;Patient limited by pain Patient left: in chair;with chair alarm set;with call bell/phone within reach Nurse Communication: Mobility status (BP) PT Visit Diagnosis: Unsteadiness on feet (R26.81);Other abnormalities of gait and mobility (R26.89);Muscle weakness (generalized) (M62.81);Difficulty in walking, not elsewhere classified (R26.2)     Time: [id number] PT Time Calculation (min) (ACUTE ONLY): 20 min  Charges:  $Therapeutic Activity: 8-22 mins                     Shelby Ibarra, PT, DPT 06/29/21, 3:06 PM    Shelby Ibarra 06/29/2021, 3:05 PM

## 2021-06-29 NOTE — Evaluation (Signed)
Occupational Therapy Evaluation Patient Details Name: Shelby Ibarra MRN: [id number] DOB: February 27, 1956 Today's Date: 06/29/2021    History of Present Illness 65 y.o. female arrived to ED after a fall (slipped), now s/p R THA. 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   Clinical Impression   Pt seen for OT evaluation this date, POD#2 from above surgery. Prior to admission, pt was MOD-I with RW for functional mobility and ADLs (sponge-bathes at baseline). Pt endorses 2 other falls within the past 6 months. Prior to admission, pt was living with husband (currently under palliative care) in a 1-level home with 1 step to enter. Pt reports that her daughter will be available 24/7 following discharge if needed.   Pt currently requires MIN A for LB dressing with adaptive equipment (i.e., sock aide and reacher) while in seated position due to pain and limited AROM of R hip. Additionally, pt requires MOD A for bed mobility (with HOB elevated) and MIN A for functional mobility of short household distances (~4 steps forwards/backwards) with RW due to pain, decreased balance, and decreased activity tolerance. Pt instructed in DME/AE for LB bathing and dressing tasks, falls prevention strategies, and home/routines modifications. Pt would benefit from additional instruction in self care skills, with or without assistive devices, to support recall and carryover prior to discharge. Recommend SNF upon discharge (however per chart review, pt without insurance. If pt unable to d/c to SNF, recommend HHOT and 24/7 assistance).    Follow Up Recommendations  SNF    Equipment Recommendations  None recommended by OT (pt has RW, BSC, and Wheelchair at home)       Precautions / Restrictions Precautions Precautions: Anterior Hip;Fall Restrictions Weight Bearing Restrictions: Yes RLE Weight Bearing: Weight bearing as tolerated      Mobility Bed  Mobility Overal bed mobility: Needs Assistance Bed Mobility: Supine to Sit     Supine to sit: Mod assist;HOB elevated     General bed mobility comments: To manage RLE and trunk    Transfers Overall transfer level: Needs assistance Equipment used: Rolling walker (2 wheeled) Transfers: Sit to/from [facility] Sit to Stand: Min assist Stand pivot transfers: Min assist       General transfer comment: Verbal cues for safe hand placement with RW use    Balance Overall balance assessment: Needs assistance Sitting-balance support: Feet supported;No upper extremity supported Sitting balance-Leahy Scale: Fair Sitting balance - Comments: fair sitting balance reaching within BOS at EOB   Standing balance support: Bilateral upper extremity supported;During functional activity Standing balance-Leahy Scale: Poor Standing balance comment: Requires b/l UE support and MIN A to walk ~4 steps forward/backwards                           ADL either performed or assessed with clinical judgement   ADL Overall ADL's : Needs assistance/impaired                     Lower Body Dressing: Minimal assistance;Sitting/lateral leans;With adaptive equipment Lower Body Dressing Details (indicate cue type and reason): to don/doff socks with adaptive equipment. MIN A for proper placement of sock aide             Functional mobility during ADLs: Minimal assistance;Rolling walker        Pertinent Vitals/Pain Pain Assessment: 0-10 Pain Score: 1  Pain Location: L hip Pain Descriptors / Indicators: Sharp Pain  Intervention(s): Limited activity within patient's tolerance;Monitored during session;Repositioned     Hand Dominance Right   Extremity/Trunk Assessment Upper Extremity Assessment Upper Extremity Assessment: Overall WFL for tasks assessed   Lower Extremity Assessment Lower Extremity Assessment: RLE deficits/detail;Defer to PT evaluation RLE: Unable to  fully assess due to pain       Communication Communication Communication: No difficulties   Cognition Arousal/Alertness: Awake/alert Behavior During Therapy: WFL for tasks assessed/performed Overall Cognitive Status: Within Functional Limits for tasks assessed                                 General Comments: A&Ox4   General Comments  Pt on 3L of O2 via [place] with SpO2 92% and HR 102 after stand pivot transfer            Home Living Family/patient expects to be discharged to:: Private residence Living Arrangements: Spouse/significant other (husband has dementia) Available Help at Discharge: Family;Available 24 hours/day Type of Home: House Home Access: Stairs to enter [employer] of Steps: 1 Entrance Stairs-Rails: None Home Layout: One level     Bathroom Shower/Tub: None (shower out of order - performs sponge bath)   Bathroom Toilet: Standard Bathroom Accessibility: No   Home Equipment: Walker - 2 wheels;Wheelchair - manual;Hand held shower head;Adaptive equipment;Bedside commode Adaptive Equipment: Reacher Additional Comments: uses RW at home, w/c for longer distances. 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 Daughter is available to care for both parents upon pt discharge      Prior Functioning/Environment Level of Independence: Needs assistance  Gait / Transfers Assistance Needed: Mod I however unsteady according to daughter using RW. Endorses two other falls within the past 6 months ADL's / Homemaking Assistance Needed: Mod I with ADLs (spongebathes at baseline), daughter assists with IADLs            OT Problem List: Decreased strength;Decreased range of motion;Impaired balance (sitting and/or standing);Pain;Decreased knowledge of use of DME or AE      OT Treatment/Interventions: Self-care/ADL training;Therapeutic exercise;Energy conservation;DME and/or AE instruction;Therapeutic activities;Patient/family  education;Balance training    OT Goals(Current goals can be found in the care plan section) Acute Rehab OT Goals Patient Stated Goal: to go home OT Goal Formulation: With patient Time For Goal Achievement: 07/13/21 Potential to Achieve Goals: Good ADL Goals Pt Will Perform Lower Body Dressing: with min guard assist;sit to/from stand;with adaptive equipment Pt Will Transfer to Toilet: with min guard assist;stand pivot transfer;bedside commode Pt Will Perform Toileting - Clothing Manipulation and hygiene: with min guard assist;sitting/lateral leans  OT Frequency: Min 1X/week    AM-PAC OT "6 Clicks" Daily Activity     Outcome Measure Help from another person eating meals?: None Help from another person taking care of personal grooming?: A Little Help from another person toileting, which includes using toliet, bedpan, or urinal?: A Little Help from another person bathing (including washing, rinsing, drying)?: A Lot Help from another person to put on and taking off regular upper body clothing?: None Help from another person to put on and taking off regular lower body clothing?: A Little 6 Click Score: 19   End of Session Equipment Utilized During Treatment: Gait belt;Rolling walker;Oxygen Nurse Communication: Mobility status  Activity Tolerance: Patient tolerated treatment well Patient left: in chair;with call bell/phone within reach;with chair alarm set  OT Visit Diagnosis: Unsteadiness on feet (R26.81);History of falling (Z91.81);Pain Pain - Right/Left: Right Pain - part of body:  Hip                Time: [id number] OT Time Calculation (min): 30 min Charges:  OT General Charges $OT Visit: 1 Visit OT Evaluation $OT Eval Moderate Complexity: 1 Mod OT Treatments $Self Care/Home Management : 23-37 mins  Fredirick Maudlin, OTR/L [place]

## 2021-06-29 NOTE — Progress Notes (Signed)
   06/29/21 0835  Assess: MEWS Score  Temp 98.6 F (37 C)  BP (!) 105/59  Pulse Rate 97  Resp (!) 22  SpO2 99 %  O2 Device Nasal Cannula  Assess: MEWS Score  MEWS Temp 0  MEWS Systolic 0  MEWS Pulse 0  MEWS RR 1  MEWS LOC 0  MEWS Score 1  MEWS Score Color Green  Document  Progress note created (see row info) Yes  Assess: SIRS CRITERIA  SIRS Temperature  0  SIRS Pulse 1  SIRS Respirations  1  SIRS WBC 0  SIRS Score Sum  2

## 2021-06-29 NOTE — Progress Notes (Signed)
[place] [facility] [place] NAME: Shelby Ibarra    MR#:  [id number]  PCP: Georga Kaufmann, NP  DATE OF BIRTH:  August 06, 1956  SUBJECTIVE:  CHIEF COMPLAINT:   Chief Complaint  Patient presents with   Fall  Became febrile last evening T-max 101.4, tachycardic to 125 and tachypneic 29.  Feeling better now since started treatment with sepsis protocol and antibiotic.  Blood pressure somewhat better.  Denies any symptoms.  Patient is requesting her Celexa REVIEW OF SYSTEMS:  Review of Systems  Constitutional:  Positive for fever. Negative for diaphoresis, malaise/fatigue and weight loss.  HENT:  Negative for ear discharge, ear pain, hearing loss, nosebleeds, sore throat and tinnitus.   Eyes:  Negative for blurred vision and pain.  Respiratory:  Negative for cough, hemoptysis, shortness of breath and wheezing.   Cardiovascular:  Negative for chest pain, palpitations, orthopnea and leg swelling.  Gastrointestinal:  Negative for abdominal pain, blood in stool, constipation, diarrhea, heartburn, nausea and vomiting.  Genitourinary:  Negative for dysuria, frequency and urgency.  Musculoskeletal:  Negative for back pain and myalgias.  Skin:  Negative for itching and rash.  Neurological:  Negative for dizziness, tingling, tremors, focal weakness, seizures, weakness and headaches.  Psychiatric/Behavioral:  Negative for depression. The patient is not nervous/anxious.   DRUG ALLERGIES:  No Known Allergies VITALS:  Blood pressure (!) 107/55, pulse 98, temperature 98.7 F (37.1 C), temperature source Oral, resp. rate 20, height '5\' 8"'$  (1.727 m), weight 64 kg, last menstrual period 10/22/1998, SpO2 99 %. PHYSICAL EXAMINATION:  Physical Exam 65 year old malnourished female lying in the bed in no acute distress Eyes pupil equal round reactive to light and accommodation, pale sclera Lungs clear to auscultation bilaterally, no wheezing rales rhonchi crepitation Cardiovascular  S1-S2 normal, no murmur rales or gallop Abdomen soft, benign Neuro alert and oriented, nonfocal Extremities: Dressing in place shows no signs of infection at the right hip area Skin no rash or lesion.  Skin pallor present Psych normal mood and affect LABORATORY PANEL:  Female CBC Recent Labs  Lab 06/29/21 0441  WBC 3.4*  HGB 9.7*  HCT 29.8*  PLT 58*    ------------------------------------------------------------------------------------------------------------------ Chemistries  Recent Labs  Lab 06/26/21 1455 06/27/21 0559 06/28/21 0512 06/29/21 0441  NA 141 135   < > 138  K 3.6 4.0   < > 3.9  CL 107 102   < > 104  CO2 26 26   < > 28  GLUCOSE 99 89   < > 115*  BUN 19 24*   < > 11  CREATININE 0.64 0.83   < > 0.65  CALCIUM 9.0 8.5*   < > 7.6*  MG  --  1.7  --   --   AST 23  --   --   --   ALT 22  --   --   --   ALKPHOS 62  --   --   --   BILITOT 0.8  --   --   --    < > = values in this interval not displayed.    MEDICATIONS:  Scheduled Meds:  [START ON 06/30/2021] azithromycin  250 mg Oral Daily   citalopram  40 mg Oral Daily   docusate sodium  100 mg Oral BID   feeding supplement  237 mL Oral TID BM   multivitamin with minerals  1 tablet Oral Daily   pilocarpine  5 mg Oral TID  senna-docusate  1 tablet Oral QHS   tiotropium  18 mcg Inhalation Daily   tranexamic acid (CYKLOKAPRON) topical - INTRAOP  2,000 mg Topical Once   Continuous Infusions:  sodium chloride 125 mL/hr at 06/29/21 0658   cefTRIAXone (ROCEPHIN)  IV 1 g (06/29/21 0700)   lactated ringers Stopped (06/29/21 0553)   RADIOLOGY:  DG Chest Port 1 View  Result Date: 06/29/2021 CLINICAL DATA:  Sepsis EXAM: PORTABLE CHEST 1 VIEW COMPARISON:  Yesterday FINDINGS: Diffuse but patchy reticular opacity which is chronic and similar to priors. No pleural effusion or generalized Kerley lines. No air leak. Normal heart size and mediastinal contours. IMPRESSION: Chronic generalized reticular opacity.  Bronchopneumonia could easily be superimposed. Electronically Signed   By: Monte Fantasia M.D.   On: 06/29/2021 05:57   ASSESSMENT AND PLAN:  65 y.o. female with medical history significant for COPD on 2 L nasal cannula, Sjogren's syndrome admitted for right femoral neck fracture after a fall at home.    Active Problems:   S/P right hip fracture   Protein-calorie malnutrition, severe  Sepsis due to mild UTI and or bronchopneumonia Continue Rocephin and add Zithromax which should cover both Await blood and urine cultures.  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  -Hold all antiplatelets or blood thinners and monitor -Consider oncology consult if further drop in counts  Severe Protein-calorie Malnutrition Encourage oral nutrition.  Body mass index is 18.24 kg/m.  COPD with chronic hypoxia on 2 L nasal cannula Stable and at baseline  Sjogren's syndrome Follows at [facility] with rheumatology   Net IO Since Admission: 4,479.25 mL [06/29/21 1306]      LOS: 3 days   Consultants: Orthopedics    Antibiotics: Given 1 dose of IV Ancef for OR Started on Rocephin and Zithromax on 8/7  Status is: Inpatient  Remains inpatient appropriate because:Unsafe d/c plan is to make sure she remains afebrile and blood pressure is stable  Dispo:  Patient From: Home  Planned Disposition: Home with home health  Medically stable for discharge: No     DVT prophylaxis:       SCDs Start: 06/27/21 1202 SCDs Start: 06/26/21 1655     Family Communication: updated patient's daughter Nira Conn over phone on 8/7   All the records are reviewed and  case discussed with Nursing and TOC team. Management plans discussed with the patient, family and they are in agreement.  CODE STATUS: Full Code Level of care: Med-Surg  TOTAL TIME TAKING CARE OF THIS PATIENT: 35 minutes.   More than 50% of the time was spent in counseling/coordination of care: YES  POSSIBLE D/C IN 1-2 DAYS, DEPENDING ON CLINICAL CONDITION.   Max Sane M.D on 06/29/2021 at 1:06 PM  [facility]   CC: Primary care physician; Georga Kaufmann, NP  Note: This dictation was prepared with Dragon dictation along with smaller phrase technology. Any transcriptional errors that result from this process are unintentional.

## 2021-06-29 NOTE — Progress Notes (Signed)
   06/29/21 0512  Assess: MEWS Score  Temp (!) 101.4 F (38.6 C) (RN Baldo Ash notified of patients mews score changing to a 5)  BP 112/64  Pulse Rate (!) 125 (RN [place] notified of patients mews score changing to a 5)  Resp (!) 29 (RN [employer] notified of patients mews score changing to a 5)  Level of Consciousness Alert  SpO2 90 %  O2 Device Room Air  Assess: MEWS Score  MEWS Temp 1  MEWS Systolic 0  MEWS Pulse 2  MEWS RR 2  MEWS LOC 0  MEWS Score 5  MEWS Score Color Red  Assess: if the MEWS score is Yellow or Red  Were vital signs taken at a resting state? Yes  Focused Assessment Change from prior assessment (see assessment flowsheet)  Does the patient meet 2 or more of the SIRS criteria? Yes  Does the patient have a confirmed or suspected source of infection? No  MEWS guidelines implemented *See Row Information* Yes  Treat  MEWS Interventions Administered prn meds/treatments;Escalated (See documentation below)  Pain Scale 0-10  Pain Score 3  Take Vital Signs  Increase Vital Sign Frequency  Red: Q 1hr X 4 then Q 4hr X 4, if remains red, continue Q 4hrs  Escalate  MEWS: Escalate Red: discuss with charge nurse/RN and provider, consider discussing with RRT  Notify: Charge Nurse/RN  Name of Charge Nurse/RN Notified Sonya RN  Date Charge Nurse/RN Notified 06/29/21  Time Charge Nurse/RN Notified [id number]  Notify: Provider  Provider Name/Title Dr, Damita Dunnings  Date Provider Notified 06/29/21  Time Provider Notified 0530  Notification Type Page (secured chat)  Notification Reason Change in status  Provider response See new orders (Sepsis workup)  Date of Provider Response 06/29/21  Time of Provider Response 0541  Notify: Rapid Response  Name of Rapid Response RN Notified Danae Chen RN  Date Rapid Response Notified 06/29/21  Time Rapid Response Notified 0529  Document  Patient Outcome Other (Comment) (Doing sepsis workup)  Progress note created (see row info) Yes  Assess:  SIRS CRITERIA  SIRS Temperature  1  SIRS Pulse 1  SIRS Respirations  1  SIRS WBC 0  SIRS Score Sum  3  The patient is A & ) x 4. Noted to be having some chills and change in vital signs. Notified RRT and Dr. Damita Dunnings. New orders received and being implemented. Will continue to monitor.

## 2021-06-29 NOTE — Progress Notes (Signed)
Cross coverage note  Called about patient at 5:30 AM with MEWS score of red  Sepsis secondary to UTI - Patient with fever of 101.4 tachycardic to 125 with respirations 29 - Chart review reveals abnormal UA from the evening prior noted to be cloudy and positive for leukocytes - Low blood pressures during the night - Sepsis work-up initiated - IV fluid bolus and maintenance initiated - Rocephin started for suspected UTI as source of infection - Follow lactic acid, CBC, CMP chest x-ray and follow urine culture from specimen from 8/6

## 2021-06-29 NOTE — Progress Notes (Signed)
   06/29/21 0525  Clinical Encounter Type  Visited With Patient  Visit Type Initial;Spiritual support;Social support  Referral From Nurse  Consult/Referral To [place] responded to an RR page as a part of the care team. PT was actively being assessed by medical staff. PT will be monitored because of low grave fever, per nurse. Chaplain ministered with presence.

## 2021-06-29 NOTE — Progress Notes (Signed)
Subjective:  Patient reports pain as mild.  Up in chair.  Objective:   VITALS:   Vitals:   06/29/21 0725 06/29/21 0835 06/29/21 0900 06/29/21 1258  BP: (!) 99/55 (!) 105/59 (!) 106/54 (!) 107/55  Pulse: (!) 101 97 92 98  Resp: 14 (!) 22  20  Temp: 99.6 F (37.6 C) 98.6 F (37 C) 98.4 F (36.9 C) 98.7 F (37.1 C)  TempSrc:    Oral  SpO2: 99% 99% 100% 99%  Weight:      Height:        PHYSICAL EXAM:  ABD soft Sensation intact distally Dorsiflexion/Plantar flexion intact Incision: dressing C/D/I No cellulitis present Compartment soft  LABS  Results for orders placed or performed during the hospital encounter of 06/26/21 (from the past 24 hour(s))  Hemoglobin and hematocrit, blood     Status: Abnormal   Collection Time: 06/28/21  6:50 PM  Result Value Ref Range   Hemoglobin 9.1 (L) 12.0 - 15.0 g/dL   HCT 27.4 (L) 36.0 - 46.0 %  Urinalysis, Complete w Microscopic Urine, Clean Catch     Status: Abnormal   Collection Time: 06/28/21  7:00 PM  Result Value Ref Range   Color, Urine YELLOW (A) YELLOW   APPearance CLOUDY (A) CLEAR   Specific Gravity, Urine 1.032 (H) 1.005 - 1.030   pH 5.0 5.0 - 8.0   Glucose, UA NEGATIVE NEGATIVE mg/dL   Hgb urine dipstick NEGATIVE NEGATIVE   Bilirubin Urine NEGATIVE NEGATIVE   Ketones, ur NEGATIVE NEGATIVE mg/dL   Protein, ur NEGATIVE NEGATIVE mg/dL   Nitrite NEGATIVE NEGATIVE   Leukocytes,Ua MODERATE (A) NEGATIVE   RBC / HPF 0-5 0 - 5 RBC/hpf   WBC, UA 0-5 0 - 5 WBC/hpf   Bacteria, UA RARE (A) NONE SEEN   Squamous Epithelial / LPF 0-5 0 - 5   Mucus PRESENT    Hyaline Casts, UA PRESENT    Ca Oxalate Crys, UA PRESENT   CBC     Status: Abnormal   Collection Time: 06/29/21  4:41 AM  Result Value Ref Range   WBC 3.4 (L) 4.0 - 10.5 K/uL   RBC 3.15 (L) 3.87 - 5.11 MIL/uL   Hemoglobin 9.7 (L) 12.0 - 15.0 g/dL   HCT 29.8 (L) 36.0 - 46.0 %   MCV 94.6 80.0 - 100.0 fL   MCH 30.8 26.0 - 34.0 pg   MCHC 32.6 30.0 - 36.0 g/dL   RDW 13.2  11.5 - 15.5 %   Platelets 58 (L) 150 - 400 K/uL   nRBC 0.0 0.0 - 0.2 %  Basic metabolic panel     Status: Abnormal   Collection Time: 06/29/21  4:41 AM  Result Value Ref Range   Sodium 138 135 - 145 mmol/L   Potassium 3.9 3.5 - 5.1 mmol/L   Chloride 104 98 - 111 mmol/L   CO2 28 22 - 32 mmol/L   Glucose, Bld 115 (H) 70 - 99 mg/dL   BUN 11 8 - 23 mg/dL   Creatinine, Ser 0.65 0.44 - 1.00 mg/dL   Calcium 7.6 (L) 8.9 - 10.3 mg/dL   GFR, Estimated >60 >60 mL/min   Anion gap 6 5 - 15  Lactic acid, plasma     Status: None   Collection Time: 06/29/21  7:07 AM  Result Value Ref Range   Lactic Acid, Venous 0.7 0.5 - 1.9 mmol/L  Protime-INR     Status: Abnormal   Collection Time: 06/29/21  7:07 AM  Result Value Ref Range   Prothrombin Time 16.7 (H) 11.4 - 15.2 seconds   INR 1.4 (H) 0.8 - 1.2  APTT     Status: None   Collection Time: 06/29/21  7:07 AM  Result Value Ref Range   aPTT 36 24 - 36 seconds  Lactic acid, plasma     Status: None   Collection Time: 06/29/21 10:00 AM  Result Value Ref Range   Lactic Acid, Venous 1.0 0.5 - 1.9 mmol/L    DG Chest Port 1 View  Result Date: 06/29/2021 CLINICAL DATA:  Sepsis EXAM: PORTABLE CHEST 1 VIEW COMPARISON:  Yesterday FINDINGS: Diffuse but patchy reticular opacity which is chronic and similar to priors. No pleural effusion or generalized Kerley lines. No air leak. Normal heart size and mediastinal contours. IMPRESSION: Chronic generalized reticular opacity. Bronchopneumonia could easily be superimposed. Electronically Signed   By: Monte Fantasia M.D.   On: 06/29/2021 05:57   DG Chest Port 1 View  Result Date: 06/28/2021 CLINICAL DATA:  Recent hip surgery.  Fever. EXAM: PORTABLE CHEST 1 VIEW COMPARISON:  05/09/2019 FINDINGS: Chronic reticulonodular opacity which is diffuse. No superimposed edema or consolidation seen. Normal heart size and mediastinal contours. IMPRESSION: Chronic lung disease. No visible pneumonia or focal atelectasis when  compared to 2020 radiograph. Electronically Signed   By: Monte Fantasia M.D.   On: 06/28/2021 08:48    Assessment/Plan: 2 Days Post-Op   Active Problems:   S/P right hip fracture   Protein-calorie malnutrition, severe   Up with therapy Discharge home with home health when cleared by primary team OK for discharge from orthopedic standpoint RTC 12 to 14 days for staple removal   Lovell Sheehan , MD 06/29/2021, 2:23 PM

## 2021-06-30 ENCOUNTER — Encounter: Payer: Self-pay | Admitting: Orthopedic Surgery

## 2021-06-30 DIAGNOSIS — Z419 Encounter for procedure for purposes other than remedying health state, unspecified: Secondary | ICD-10-CM

## 2021-06-30 DIAGNOSIS — R509 Fever, unspecified: Secondary | ICD-10-CM

## 2021-06-30 DIAGNOSIS — W19XXXA Unspecified fall, initial encounter: Secondary | ICD-10-CM

## 2021-06-30 DIAGNOSIS — S72001A Fracture of unspecified part of neck of right femur, initial encounter for closed fracture: Secondary | ICD-10-CM

## 2021-06-30 LAB — BASIC METABOLIC PANEL
Anion gap: 5 (ref 5–15)
BUN: 12 mg/dL (ref 8–23)
CO2: 29 mmol/L (ref 22–32)
Calcium: 7.9 mg/dL — ABNORMAL LOW (ref 8.9–10.3)
Chloride: 107 mmol/L (ref 98–111)
Creatinine, Ser: 0.54 mg/dL (ref 0.44–1.00)
GFR, Estimated: >60 mL/min (ref >60)
Glucose, Bld: 109 mg/dL — ABNORMAL HIGH (ref 70–99)
Potassium: 3.7 mmol/L (ref 3.5–5.1)
Sodium: 141 mmol/L (ref 135–145)

## 2021-06-30 LAB — CBC
HCT: 24 % — ABNORMAL LOW (ref 36.0–46.0)
Hemoglobin: 7.8 g/dL — ABNORMAL LOW (ref 12.0–15.0)
MCH: 31.5 pg (ref 26.0–34.0)
MCHC: 32.5 g/dL (ref 30.0–36.0)
MCV: 96.8 fL (ref 80.0–100.0)
Platelets: 63 10*3/uL — ABNORMAL LOW (ref 150–400)
RBC: 2.48 MIL/uL — ABNORMAL LOW (ref 3.87–5.11)
RDW: 13.1 % (ref 11.5–15.5)
WBC: 2.3 10*3/uL — ABNORMAL LOW (ref 4.0–10.5)
nRBC: 0 % (ref 0.0–0.2)

## 2021-06-30 MED ORDER — AMOXICILLIN-POT CLAVULANATE 875-125 MG PO TABS: 1.0000 | ORAL_TABLET | Freq: Two times a day (BID) | ORAL | 0 refills | Status: AC

## 2021-06-30 MED FILL — CITALOPRAM 40 MG TABLET: ORAL | 90 days supply | Qty: 90 | Fill #1

## 2021-06-30 NOTE — Progress Notes (Signed)
Physical Therapy Treatment Patient Details Name: Shelby Ibarra MRN: [id number] DOB: 27-Aug-1956 Today's Date: 06/30/2021    History of Present Illness 65 y.o. female arrived to ED after a fall (slipped), now s/p R THA. 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    PT Comments    Pt seen for PT tx with pt received in recliner & found to be incontinent of bowels with pt unaware. Pt is able to complete sit<>stand transfers from recliner & toilet during session with supervision<>CGA. PT performs dependent peri hygiene but pt continuing to have BM so pt ambulates to toilet with CGA<>close supervision with gait pattern as noted below. Pt with continent BM on toilet but unable to perform peri hygiene due to continued need to have BM. Pt left in hand off to NT.  Of note, pt reports she's on 2L/min at night at home. Pt received on 2L with SPO2 98% so pt placed on room air. O2 dropped to 87% with standing so pt placed back on 2L & remained >90% on 2L/min throughout remainder of session. HR up to 117 bpm.    Follow Up Recommendations  SNF;Home health PT;Supervision/Assistance - 24 hour     Equipment Recommendations  3in1 (PT);Rolling walker with 5" wheels    Recommendations for Other Services       Precautions / Restrictions Precautions Precautions: Anterior Hip;Fall Restrictions Weight Bearing Restrictions: Yes RLE Weight Bearing: Weight bearing as tolerated    Mobility  Bed Mobility               General bed mobility comments: not observed, pt received & left sitting in recliner    Transfers Overall transfer level: Needs assistance Equipment used: Rolling walker (2 wheeled) Transfers: Sit to/from Stand Sit to Stand: Supervision;Min guard            Ambulation/Gait Ambulation/Gait assistance: Min guard;Supervision Gait Distance (Feet): 15 Feet Assistive device: Rolling walker (2 wheeled) Gait  Pattern/deviations: Decreased step length - right;Decreased step length - left;Decreased stride length;Decreased dorsiflexion - right;Decreased weight shift to right Gait velocity: decreased   General Gait Details: RLE genu valgum, does not fully extend R knee in stance phase.   Stairs             Wheelchair Mobility    Modified Rankin (Stroke Patients Only)       Balance Overall balance assessment: Needs assistance Sitting-balance support: Bilateral upper extremity supported;Feet supported Sitting balance-Leahy Scale: Good     Standing balance support: Bilateral upper extremity supported;During functional activity Standing balance-Leahy Scale: Poor                              Cognition Arousal/Alertness: Awake/alert Behavior During Therapy: WFL for tasks assessed/performed Overall Cognitive Status: Within Functional Limits for tasks assessed                                 General Comments: Pt pleasant & agreeable to tx. Pt incontinent of bowels & unaware of this. Pt reports to PT she is only on 2L/min at night at home, but nurse reports pt is on O2 chronically. Spoke with MD who reports she is on 2L/min at night & PRN use during the day.      Exercises      General Comments  Pertinent Vitals/Pain Pain Assessment: Faces Faces Pain Scale: Hurts whole lot Pain Location: L hip Pain Descriptors / Indicators: Grimacing;Discomfort Pain Intervention(s): Monitored during session    Home Living                      Prior Function            PT Goals (current goals can now be found in the care plan section) Acute Rehab PT Goals Patient Stated Goal: to go home PT Goal Formulation: With patient/family Time For Goal Achievement: 07/12/21 Potential to Achieve Goals: Fair Progress towards PT goals: Progressing toward goals    Frequency    BID      PT Plan Current plan remains appropriate    Co-evaluation               AM-PAC PT "6 Clicks" Mobility   Outcome Measure  Help needed turning from your back to your side while in a flat bed without using bedrails?: A Little Help needed moving from lying on your back to sitting on the side of a flat bed without using bedrails?: A Lot Help needed moving to and from a bed to a chair (including a wheelchair)?: A Little Help needed standing up from a chair using your arms (e.g., wheelchair or bedside chair)?: A Little Help needed to walk in hospital room?: A Little Help needed climbing 3-5 steps with a railing? : A Lot 6 Click Score: 16    End of Session Equipment Utilized During Treatment: Gait belt;Oxygen Activity Tolerance: Patient tolerated treatment well;Patient limited by fatigue;Patient limited by pain Patient left:  (on toilet in care of NT) Nurse Communication:  (O2) PT Visit Diagnosis: Unsteadiness on feet (R26.81);Other abnormalities of gait and mobility (R26.89);Muscle weakness (generalized) (M62.81);Difficulty in walking, not elsewhere classified (R26.2);Pain Pain - Right/Left: Left Pain - part of body: Hip     Time: [id number] PT Time Calculation (min) (ACUTE ONLY): 27 min  Charges:  $Therapeutic Activity: 23-37 mins                     Lavone Nian, PT, DPT 06/30/21, 12:35 PM    Waunita Schooner 06/30/2021, 12:31 PM

## 2021-06-30 NOTE — Progress Notes (Signed)
Physical Therapy Treatment Patient Details Name: Shelby Ibarra MRN: [id number] DOB: [birth year] Today's Date: 06/30/2021    History of Present Illness 65 y.o. female arrived to ED after a fall (slipped), now s/p R THA. 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    PT Comments    Pt seen for PT tx with pt agreeable. Pt sitting on pad saturated with urine & pt seems unaware. Pt is able to complete sit>stand transfers with CGA<>supervision with extra time to power up to standing. Pt is able to progress ambulation distances with RW & CGA<>supervision but requires seated rest breaks 2/2 fatigue. Step unavailable to practice stair negotiation simulating her home environment - discussed with pt plans to retreive step to practice prior to d/c & pt agreeable.   Returned to room at 1530 to practice/review stair negotiation but pt had already discharged. Spoke with nurse who reports pt was aware of need to practice stair negotiation but her friend was present & eager to d/c pt home at this time.     Follow Up Recommendations  SNF;Home health PT;Supervision/Assistance - 24 hour     Equipment Recommendations  3in1 (PT);Rolling walker with 5" wheels    Recommendations for Other Services       Precautions / Restrictions Precautions Precautions: Anterior Hip;Fall Restrictions Weight Bearing Restrictions: Yes RLE Weight Bearing: Weight bearing as tolerated    Mobility  Bed Mobility               General bed mobility comments: not observed, pt received & left sitting in recliner    Transfers Overall transfer level: Needs assistance Equipment used: Rolling walker (2 wheeled) Transfers: Sit to/from Stand Sit to Stand: Supervision;Min guard         General transfer comment: extra time to achieve full upright standing  Ambulation/Gait Ambulation/Gait assistance: Min guard;Supervision Gait Distance (Feet): 35 Feet (+ 20  ft) Assistive device: Rolling walker (2 wheeled) Gait Pattern/deviations: Decreased step length - right;Decreased step length - left;Decreased stride length;Decreased dorsiflexion - right;Decreased weight shift to right Gait velocity: decreased   General Gait Details: RLE genu valgum, does not fully extend R knee in stance phase.   Stairs             Wheelchair Mobility    Modified Rankin (Stroke Patients Only)       Balance Overall balance assessment: Needs assistance Sitting-balance support: Bilateral upper extremity supported;Feet supported Sitting balance-Leahy Scale: Good     Standing balance support: Bilateral upper extremity supported;During functional activity Standing balance-Leahy Scale: Poor Standing balance comment: BUE support on RW & min assist                            Cognition Arousal/Alertness: Awake/alert Behavior During Therapy: WFL for tasks assessed/performed Overall Cognitive Status: Difficult to assess                                 General Comments: Pt incontinent of urine & sitting on saturated pad but seems unaware of this.      Exercises      General Comments General comments (skin integrity, edema, etc.): Pt on 2L/min via nasal cannula, SpO2 >90%      Pertinent Vitals/Pain Pain Assessment: Faces Faces Pain Scale: Hurts whole lot Pain Location: L hip Pain Descriptors / Indicators: Grimacing;Discomfort Pain  Intervention(s): Monitored during session    Home Living                      Prior Function            PT Goals (current goals can now be found in the care plan section) Acute Rehab PT Goals Patient Stated Goal: to go home PT Goal Formulation: With patient/family Time For Goal Achievement: 07/12/21 Potential to Achieve Goals: Fair Progress towards PT goals: Progressing toward goals    Frequency    BID      PT Plan Current plan remains appropriate    Co-evaluation               AM-PAC PT "6 Clicks" Mobility   Outcome Measure  Help needed turning from your back to your side while in a flat bed without using bedrails?: A Little Help needed moving from lying on your back to sitting on the side of a flat bed without using bedrails?: A Lot Help needed moving to and from a bed to a chair (including a wheelchair)?: A Little Help needed standing up from a chair using your arms (e.g., wheelchair or bedside chair)?: A Little Help needed to walk in hospital room?: A Little Help needed climbing 3-5 steps with a railing? : A Lot 6 Click Score: 16    End of Session Equipment Utilized During Treatment: Gait belt;Oxygen Activity Tolerance: Patient tolerated treatment well;Patient limited by fatigue;Patient limited by pain Patient left: in chair;with chair alarm set;with call bell/phone within reach;with family/visitor present Nurse Communication:  (O2) PT Visit Diagnosis: Unsteadiness on feet (R26.81);Other abnormalities of gait and mobility (R26.89);Muscle weakness (generalized) (M62.81);Difficulty in walking, not elsewhere classified (R26.2);Pain Pain - Right/Left: Left Pain - part of body: Hip     Time: 1350-1414 PT Time Calculation (min) (ACUTE ONLY): 24 min  Charges:  $Gait Training: 8-22 mins $Therapeutic Activity: 8-22 mins                     Shelby Ibarra, PT, DPT 06/30/21, 4:00 PM    Waunita Schooner 06/30/2021, 3:58 PM

## 2021-06-30 NOTE — Anesthesia Postprocedure Evaluation (Signed)
Anesthesia Post Note  Patient: Shelby Ibarra  Procedure(s) Performed: TOTAL HIP ARTHROPLASTY ANTERIOR APPROACH (Right: Hip)  Patient location during evaluation: PACU Anesthesia Type: Spinal Level of consciousness: oriented and awake and alert Pain management: pain level controlled Vital Signs Assessment: post-procedure vital signs reviewed and stable Respiratory status: spontaneous breathing, respiratory function stable and patient connected to nasal cannula oxygen Cardiovascular status: blood pressure returned to baseline and stable Postop Assessment: no headache, no backache and no apparent nausea or vomiting Anesthetic complications: no   No notable events documented.   Last Vitals:  Vitals:   06/30/21 0340 06/30/21 0744  BP: 108/64 (!) 111/49  Pulse: 80 84  Resp: 16 15  Temp: 36.9 C 36.8 C  SpO2: 100% 100%    Last Pain:  Vitals:   06/30/21 0744  TempSrc: Oral  PainSc:                  [place] Ouch

## 2021-06-30 NOTE — Progress Notes (Signed)
Discharge Note: Reviewed discharge instructions with pt. Pt. Verbalized understanding. Pt discharged with abductor pillow, personal belongings. Pt discharged with home portable oxygen tank.  Staff wheeled pt out. Pt. Transported to home via friend's vehicle.

## 2021-06-30 NOTE — TOC Progression Note (Addendum)
Transition of Care [facility]) - Progression Note    Patient Details  Name: AQUITA QUINNELL MRN: [id number] Date of Birth: 03/15/1956  Transition of Care [facility]) CM/SW [place], RN Phone Number: 06/30/2021, 12:16 PM  Clinical Narrative: patient discharging today.  Recommendation is for SNF, however patient is uninsured and discharged at this time.  Care team and patient agree to [facility].  [facility] notified and able to accept patient.Patient has rolling walker and BSC at home.      Addendum: patient has home o2 with a company in [place], set up prior to this admission.  States she has no issues with this company at this time.  Expected Discharge Plan: [place] Barriers to Discharge: Continued Medical Work up  Expected Discharge Plan and Services Expected Discharge Plan: [place]   Discharge Planning Services: CM Consult ([facility])   Living arrangements for the past 2 months: Single Family Home Expected Discharge Date: 06/30/21               DME Arranged: N/A         HH Arranged: PT, OT HH Agency: [facility] Date [place]: 06/27/21 Time [place]: [place] Representative spoke with at [place]: [place]   Social Determinants of Health ([place]) Interventions    Readmission Risk Interventions No flowsheet data found.

## 2021-06-30 NOTE — Progress Notes (Signed)
  Subjective:  Patient reports pain as mild.    Objective:   VITALS:   Vitals:   06/30/21 0107 06/30/21 0340 06/30/21 0744 06/30/21 1130  BP: (!) 100/58 108/64 (!) 111/49 (!) 112/53  Pulse: 80 80 84 100  Resp: '16 16 15 15  '$ Temp: 97.9 F (36.6 C) 98.5 F (36.9 C) 98.2 F (36.8 C) 98.3 F (36.8 C)  TempSrc:   Oral   SpO2: 100% 100% 100% 100%  Weight:      Height:        PHYSICAL EXAM:  Neurologically intact ABD soft Neurovascular intact Sensation intact distally Intact pulses distally Dorsiflexion/Plantar flexion intact Incision: dressing C/D/I and dressing changed today No cellulitis present Compartment soft  LABS  Results for orders placed or performed during the hospital encounter of 06/26/21 (from the past 24 hour(s))  CBC     Status: Abnormal   Collection Time: 06/30/21  3:55 AM  Result Value Ref Range   WBC 2.3 (L) 4.0 - 10.5 K/uL   RBC 2.48 (L) 3.87 - 5.11 MIL/uL   Hemoglobin 7.8 (L) 12.0 - 15.0 g/dL   HCT 24.0 (L) 36.0 - 46.0 %   MCV 96.8 80.0 - 100.0 fL   MCH 31.5 26.0 - 34.0 pg   MCHC 32.5 30.0 - 36.0 g/dL   RDW 13.1 11.5 - 15.5 %   Platelets 63 (L) 150 - 400 K/uL   nRBC 0.0 0.0 - 0.2 %  Basic metabolic panel     Status: Abnormal   Collection Time: 06/30/21  3:55 AM  Result Value Ref Range   Sodium 141 135 - 145 mmol/L   Potassium 3.7 3.5 - 5.1 mmol/L   Chloride 107 98 - 111 mmol/L   CO2 29 22 - 32 mmol/L   Glucose, Bld 109 (H) 70 - 99 mg/dL   BUN 12 8 - 23 mg/dL   Creatinine, Ser 0.54 0.44 - 1.00 mg/dL   Calcium 7.9 (L) 8.9 - 10.3 mg/dL   GFR, Estimated >60 >60 mL/min   Anion gap 5 5 - 15    DG Chest Port 1 View  Result Date: 06/29/2021 CLINICAL DATA:  Sepsis EXAM: PORTABLE CHEST 1 VIEW COMPARISON:  Yesterday FINDINGS: Diffuse but patchy reticular opacity which is chronic and similar to priors. No pleural effusion or generalized Kerley lines. No air leak. Normal heart size and mediastinal contours. IMPRESSION: Chronic generalized reticular  opacity. Bronchopneumonia could easily be superimposed. Electronically Signed   By: Monte Fantasia M.D.   On: 06/29/2021 05:57    Assessment/Plan: 3 Days Post-Op   Active Problems:   S/P right hip fracture   Protein-calorie malnutrition, severe   Closed fracture of right hip [facility])   Fall   Surgery, elective   Fever   Advance diet Up with therapy WBAT RLE Patient already has walker and 3N1 commode No DVT prophylaxis Dressing changed today. No need to change at home. Discharge home with home health when cleared by primary team OK for discharge from orthopedic standpoint Follow up on Friday 07/11/21 for staple removal Call to confirm appointment [phone]  Carlynn Spry , PA-C 06/30/2021, 12:23 PM

## 2021-07-01 LAB — TYPE AND SCREEN
ABO/RH(D): A POS
Antibody Screen: POSITIVE
Unit division: 0
Unit division: 0
Unit division: 0

## 2021-07-01 LAB — BPAM RBC
Blood Product Expiration Date: 202208302359
Blood Product Expiration Date: 202208312359
Blood Product Expiration Date: 202209032359
ISSUE DATE / TIME: 202208061024
Unit Type and Rh: 6200
Unit Type and Rh: 6200
Unit Type and Rh: 6200

## 2021-07-01 LAB — SURGICAL PATHOLOGY

## 2021-07-02 NOTE — Discharge Summary (Signed)
[place] [facility] [place] NAME: Shelby Ibarra    MR#:  [id number]  DATE OF BIRTH:  1956-02-19  DATE OF ADMISSION:  06/26/2021   ADMITTING PHYSICIAN: Kayleen Memos, DO  DATE OF DISCHARGE: 06/30/2021  5:04 PM  PRIMARY CARE PHYSICIAN: Georga Kaufmann, NP   ADMISSION DIAGNOSIS:  Fall, initial encounter [phone]] Closed fracture of right hip, initial encounter ([place]) [S72.001A] S/P right hip fracture [Z87.81] DISCHARGE DIAGNOSIS:  Active Problems:   S/P right hip fracture   Protein-calorie malnutrition, severe   Closed fracture of right hip [facility])   Fall   Surgery, elective   Fever  SECONDARY DIAGNOSIS:   Past Medical History:  Diagnosis Date   Anxiety    Asthma    Cancer ([place])    Cervical cancer ([place]) 1999   COPD (chronic obstructive pulmonary disease) ([place])    Depression    History of home oxygen therapy    2l/ [place]   HOSPITAL COURSE:  65 y.o. female with medical history significant for COPD on 2 L nasal cannula, Sjogren's syndrome admitted for right femoral neck fracture after a fall at home.     Sepsis due to mild UTI and or bronchopneumonia Sepsis evidenced by fever, tachycardia, hypotension and tachypnea on 8/7 Treated with Rocephin and Zithromax while in the Hospital. Sepsis resolved with treatment. Blood cs neg. Urine c/s was never sent.   Acute blood loss anemia -likely during surgery Hemoglobin 12.5> 8> 9.7>7.8 S/p 1 PRBC transfusion. -No further bleeding   Right femoral neck fracture Status post right total hip replacement by Ortho on 8/5 Patient and family preferred to go home with HHPT as they lack insurance. Daughter is very supportive.   Thrombocytopenia  from underlying Sjogren's syndrome. Chronic and stable   Severe Protein-calorie Malnutrition Encourage oral nutrition. Body mass index is 18.24 kg/m.   COPD with chronic hypoxia on 2 L nasal cannula Stable and at baseline   Sjogren's syndrome Follows at [facility] with  rheumatology  DISCHARGE CONDITIONS:  stable CONSULTS OBTAINED:  Treatment Team:  Lovell Sheehan, MD DRUG ALLERGIES:  No Known Allergies DISCHARGE MEDICATIONS:   Allergies as of 06/30/2021   No Known Allergies      Medication List     STOP taking these medications    triamcinolone cream 0.1 % Commonly known as: KENALOG       TAKE these medications    albuterol 108 (90 Base) MCG/ACT inhaler Commonly known as: VENTOLIN HFA Inhale 2 puffs into the lungs every 6 (six) hours as needed for wheezing or shortness of breath.   amoxicillin-clavulanate 875-125 MG tablet Commonly known as: Augmentin Take 1 tablet by mouth every 12 (twelve) hours for 5 days.   budesonide-formoterol 160-4.5 MCG/ACT inhaler Commonly known as: SYMBICORT Inhale 2 puffs into the lungs 2 (two) times daily.   citalopram 40 MG tablet Commonly known as: CELEXA Take 40 mg by mouth daily.   hydroxychloroquine 200 MG tablet Commonly known as: PLAQUENIL Take 200 mg by mouth daily.   pilocarpine 5 MG tablet Commonly known as: SALAGEN Take 5 mg by mouth 3 (three) times daily.   Tiotropium Bromide Monohydrate 2.5 MCG/ACT Aers Inhale 1 puff into the lungs daily at 12 noon.       DISCHARGE INSTRUCTIONS:   DIET:  Regular diet DISCHARGE CONDITION:  Good ACTIVITY:  Activity as tolerated OXYGEN:  Home Oxygen: Yes.    Oxygen Delivery: 2 liters/min via Patient connected to nasal cannula oxygen DISCHARGE LOCATION:  Home with HHPT, OT   If you experience worsening of your admission symptoms, develop shortness of breath, life threatening emergency, suicidal or homicidal thoughts you must seek medical attention immediately by calling 911 or calling your MD immediately  if symptoms less severe.  You Must read complete instructions/literature along with all the possible adverse reactions/side effects for all the Medicines you take and that have been prescribed to you. Take any new Medicines after you  have completely understood and accpet all the possible adverse reactions/side effects.   Please note  You were cared for by a hospitalist during your hospital stay. If you have any questions about your discharge medications or the care you received while you were in the hospital after you are discharged, you can call the unit and asked to speak with the hospitalist on call if the hospitalist that took care of you is not available. Once you are discharged, your primary care physician will handle any further medical issues. Please note that NO REFILLS for any discharge medications will be authorized once you are discharged, as it is imperative that you return to your primary care physician (or establish a relationship with a primary care physician if you do not have one) for your aftercare needs so that they can reassess your need for medications and monitor your lab values.    On the day of Discharge:  VITAL SIGNS:  Blood pressure (!) 112/53, pulse 100, temperature 98.3 F (36.8 C), resp. rate 15, height '5\' 8"'$  (1.727 m), weight 64 kg, last menstrual period 10/22/1998, SpO2 100 %. PHYSICAL EXAMINATION:  GENERAL:  65 y.o.-year-old patient lying in the bed with no acute distress.  EYES: Pupils equal, round, reactive to light and accommodation. No scleral icterus. Extraocular muscles intact.  HEENT: Head atraumatic, normocephalic. Oropharynx and nasopharynx clear.  NECK:  Supple, no jugular venous distention. No thyroid enlargement, no tenderness.  LUNGS: Normal breath sounds bilaterally, no wheezing, rales,rhonchi or crepitation. No use of accessory muscles of respiration.  CARDIOVASCULAR: S1, S2 normal. No murmurs, rubs, or gallops.  ABDOMEN: Soft, non-tender, non-distended. Bowel sounds present. No organomegaly or mass.  EXTREMITIES: No pedal edema, cyanosis, or clubbing.  NEUROLOGIC: Cranial nerves II through XII are intact. Muscle strength 5/5 in all extremities. Sensation intact. Gait not  checked.  PSYCHIATRIC: The patient is alert and oriented x 3.  SKIN: No obvious rash, lesion, or ulcer.  DATA REVIEW:   CBC Recent Labs  Lab 06/30/21 0355  WBC 2.3*  HGB 7.8*  HCT 24.0*  PLT 63*    Chemistries  Recent Labs  Lab 06/26/21 1455 06/27/21 0559 06/28/21 0512 06/30/21 0355  NA 141 135   < > 141  K 3.6 4.0   < > 3.7  CL 107 102   < > 107  CO2 26 26   < > 29  GLUCOSE 99 89   < > 109*  BUN 19 24*   < > 12  CREATININE 0.64 0.83   < > 0.54  CALCIUM 9.0 8.5*   < > 7.9*  MG  --  1.7  --   --   AST 23  --   --   --   ALT 22  --   --   --   ALKPHOS 62  --   --   --   BILITOT 0.8  --   --   --    < > = values in this interval not displayed.     Outpatient  follow-up  Follow-up Information     Georga Kaufmann, NP. Schedule an appointment as soon as possible for a visit in 3 day(s).   Specialty: Acute Care Why: [facility] Discharge F/UP Contact information: [address] [place] [address] [phone]         Lovell Sheehan, MD. Schedule an appointment as soon as possible for a visit in 2 week(s).   Specialty: Orthopedic Surgery Why: [facility] Discharge F/UP Contact information: [place] [address] [phone]                 30 Day Unplanned Readmission Risk Score    Flowsheet Row ED to Hosp-Admission (Discharged) from 06/26/2021 in [place] (1A)  30 Day Unplanned Readmission Risk Score (%) 11.01 Filed at 06/30/2021 1600       This score is the patient's risk of an unplanned readmission within 30 days of being discharged (0 -100%). The score is based on dignosis, age, lab data, medications, orders, and past utilization.   Low:  0-14.9   Medium: 15-21.9   High: 22-29.9   Extreme: 30 and above          Management plans discussed with the patient, family and they are in agreement.  CODE STATUS: Prior   TOTAL TIME TAKING CARE OF THIS PATIENT: 45  minutes.    Max Sane M.D on 07/02/2021 at 2:53 PM  [facility]   CC: Primary care physician; Georga Kaufmann, NP   Note: This dictation was prepared with Dragon dictation along with smaller phrase technology. Any transcriptional errors that result from this process are unintentional.

## 2021-07-04 LAB — CULTURE, BLOOD (ROUTINE X 2)
Culture: NO GROWTH
Culture: NO GROWTH
Special Requests: ADEQUATE
Special Requests: ADEQUATE

## 2021-07-18 ENCOUNTER — Institutional Professional Consult (permissible substitution): Admit: 2021-07-18 | Discharge: 2021-07-19 | Attending: Clinical | Primary: Clinical

## 2021-07-18 DIAGNOSIS — F418 Other specified anxiety disorders: Principal | ICD-10-CM

## 2021-07-18 DIAGNOSIS — Z636 Dependent relative needing care at home: Principal | ICD-10-CM

## 2021-07-18 DIAGNOSIS — F4320 Adjustment disorder, unspecified: Principal | ICD-10-CM

## 2021-07-25 ENCOUNTER — Ambulatory Visit
Admit: 2021-07-25 | Discharge: 2021-07-26 | Payer: MEDICAID | Attending: Student in an Organized Health Care Education/Training Program | Primary: Student in an Organized Health Care Education/Training Program

## 2021-07-25 DIAGNOSIS — R269 Unspecified abnormalities of gait and mobility: Principal | ICD-10-CM

## 2021-07-25 DIAGNOSIS — M3501 Sicca syndrome with keratoconjunctivitis: Principal | ICD-10-CM

## 2021-07-31 ENCOUNTER — Ambulatory Visit
Admit: 2021-07-31 | Discharge: 2021-08-01 | Attending: Student in an Organized Health Care Education/Training Program | Primary: Student in an Organized Health Care Education/Training Program

## 2021-07-31 DIAGNOSIS — M3501 Sicca syndrome with keratoconjunctivitis: Principal | ICD-10-CM

## 2021-07-31 DIAGNOSIS — S72001D Fracture of unspecified part of neck of right femur, subsequent encounter for closed fracture with routine healing: Principal | ICD-10-CM

## 2021-07-31 DIAGNOSIS — D72819 Decreased white blood cell count, unspecified: Principal | ICD-10-CM

## 2021-07-31 DIAGNOSIS — M8000XD Age-related osteoporosis with current pathological fracture, unspecified site, subsequent encounter for fracture with routine healing: Principal | ICD-10-CM

## 2021-07-31 DIAGNOSIS — R61 Generalized hyperhidrosis: Principal | ICD-10-CM

## 2021-07-31 DIAGNOSIS — D696 Thrombocytopenia, unspecified: Principal | ICD-10-CM

## 2021-07-31 DIAGNOSIS — F419 Anxiety disorder, unspecified: Principal | ICD-10-CM

## 2021-07-31 MED ORDER — TRAZODONE 50 MG TABLET
ORAL_TABLET | Freq: Every evening | ORAL | 0 refills | 30 days | Status: CP
Start: 2021-07-31 — End: 2021-08-30

## 2021-07-31 MED FILL — IPRATROPIUM BROMIDE 0.02 % SOLUTION FOR INHALATION: RESPIRATORY_TRACT | 7 days supply | Qty: 62.5 | Fill #1

## 2021-07-31 MED FILL — ALBUTEROL SULFATE HFA 90 MCG/ACTUATION AEROSOL INHALER: RESPIRATORY_TRACT | 25 days supply | Qty: 8.5 | Fill #0

## 2021-07-31 MED FILL — BUDESONIDE-FORMOTEROL HFA 160 MCG-4.5 MCG/ACTUATION AEROSOL INHALER: RESPIRATORY_TRACT | 30 days supply | Qty: 10.2 | Fill #1

## 2021-07-31 MED FILL — SPIRIVA RESPIMAT 2.5 MCG/ACTUATION SOLUTION FOR INHALATION: RESPIRATORY_TRACT | 30 days supply | Qty: 4 | Fill #3

## 2021-07-31 MED FILL — ALBUTEROL SULFATE 2.5 MG/3 ML (0.083 %) SOLUTION FOR NEBULIZATION: RESPIRATORY_TRACT | 30 days supply | Qty: 360 | Fill #3

## 2021-08-04 ENCOUNTER — Ambulatory Visit: Admit: 2021-08-04 | Discharge: 2021-08-05

## 2021-08-04 DIAGNOSIS — R269 Unspecified abnormalities of gait and mobility: Principal | ICD-10-CM

## 2021-08-04 DIAGNOSIS — M3501 Sicca syndrome with keratoconjunctivitis: Principal | ICD-10-CM

## 2021-08-05 ENCOUNTER — Institutional Professional Consult (permissible substitution): Admit: 2021-08-05 | Discharge: 2021-08-06 | Attending: Clinical | Primary: Clinical

## 2021-08-05 DIAGNOSIS — F418 Other specified anxiety disorders: Principal | ICD-10-CM

## 2021-08-05 DIAGNOSIS — F4320 Adjustment disorder, unspecified: Principal | ICD-10-CM

## 2021-08-05 DIAGNOSIS — Z636 Dependent relative needing care at home: Principal | ICD-10-CM

## 2021-08-05 DIAGNOSIS — Z72820 Sleep deprivation: Principal | ICD-10-CM

## 2021-08-08 ENCOUNTER — Institutional Professional Consult (permissible substitution): Admit: 2021-08-08 | Discharge: 2021-08-09 | Payer: MEDICAID | Attending: Internal Medicine | Primary: Internal Medicine

## 2021-08-08 MED ORDER — TRAZODONE 50 MG TABLET
ORAL_TABLET | Freq: Every evening | ORAL | 0 refills | 60.00000 days | Status: CP
Start: 2021-08-08 — End: 2021-09-07

## 2021-08-08 MED ORDER — HYDROXYZINE HCL 25 MG TABLET
ORAL_TABLET | Freq: Two times a day (BID) | ORAL | 2 refills | 30 days | Status: CP | PRN
Start: 2021-08-08 — End: ?

## 2021-08-14 ENCOUNTER — Ambulatory Visit: Admit: 2021-08-14 | Discharge: 2021-08-15 | Attending: Internal Medicine | Primary: Internal Medicine

## 2021-08-14 DIAGNOSIS — M3501 Sicca syndrome with keratoconjunctivitis: Principal | ICD-10-CM

## 2021-08-14 DIAGNOSIS — D72819 Decreased white blood cell count, unspecified: Principal | ICD-10-CM

## 2021-08-14 DIAGNOSIS — D696 Thrombocytopenia, unspecified: Principal | ICD-10-CM

## 2021-08-14 NOTE — Unmapped (Signed)
Hematology Follow-Up Note    Primary Care Provider:  Johnsie Cancel, MD     Reason for Visit:  Leukopenia; thrombocytopenia    Assessment/Recommendations:     Christy Ibarra is a 65 y.o. female who is being seen at the Hematology clinic today for follow-up of recent evaluation of leukopenia.     Leukopenia:  Christy Ibarra has a mild leukopenia that is secondary to both mild neutropenia and lymphopenia.   On further work-up, has normal copper, zinc levels, thyroid function, and no M-spike (does have some oligoclonal bands). 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 Overall, we discussed that her leukopenia is likely related to Sjogrens syndrome and we would hope it will improve as her Sjogrens syndrome is better controlled. Notably from 10/6 to 11/30 her ANC normalized though ALC remained low (with starting prolonged prednisone taper for neuro Sjogrens). We recommend checking her CBC-D periodically, at least every six months to trend over time. We would be happy to see her back in clinic as the need arises but we will not schedule any formal visits.     We will not make any formal follow-up visits but can reach out at any time.      __________________________________________________________    History of Presenting Illness:  Christy Ibarra is a 65 y.o. Caucasian female who is being seen at the Hematology clinic today for follow-up of leukopenia at the request of Dr. Delane Ibarra.  I saw Christy Ibarra in consultation on 10/23/2019.  Kindly refer to my consult note for full details.    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 She is currently on pred 30mg  daily for two weeks with goal of getting her to 20mg  daily until follow-up with Rheumatology. She is also on Bactrim for PJP ppx. She notes that her pain in her legs has improved but her weakness (though improved) has not improved to the degree that she would have hoped. She notes mild weight gain, increased appetite, and swelling due to prednisone but denies fever, chills, night sweats, headaches, chest pain, SOB, cough, abdominal pain, N/V/D, constipation, urinary symptoms, LE swelling, new rashes or bruises.     Her work-up from clinic on 11/30 included:  Copper WNL  Zinc WNL  SPEP + IFE showing a faint oligoclonal band characteristic of multiple clonality  IgG 245  IgA 458  TSH WNL  Neutrophil ab: HLA class I antibody screen positive    Review of Systems:  Christy Ibarra does not endorse history of fevers, chills or night sweats. No history of recurrent infection. Pertinent positives have been summarized under HPI above.    A 12 systems ROS was reviewed and is otherwise negative.     Allergies:  Center-al house dust    Medications:   Christy Ibarra has a current medication list which includes the following prescription(s): albuterol, albuterol, budesonide-formoterol, citalopram, hydroxychloroquine, hydroxyzine, ipratropium, pilocarpine, tiotropium bromide, trazodone, triamcinolone, and amitriptyline.    Medical History:  Past Medical History:   Diagnosis Date   ??? Anxiety    ??? Cataract    ??? COPD (chronic obstructive pulmonary disease) (CMS-HCC)    ??? Depression    ??? Dry eyes    ??? Joint pain    ??? Scoliosis      Social History:  Tobacco use: former smoker  Alcohol use: denies  Drug use: denies  Family History:  ??? Cancer Mother   ??? Diabetes Father   ??? Heart disease Father   ??? Hypertension Father   ??? Osteoporosis Sister     Physical Exam:  BP 120/54  - Pulse 60  - Temp 36.3 ??C (97.3 ??F)  - Ht 172.7 cm (5' 7.99)  - Wt 55.3 kg (122 lb)  - SpO2 97%  - BMI 18.55 kg/m??      General:   No acute distress, cooperative   Eyes:   Pupil equal round reacting to light and accomodation. Extra  occular movements intact, and sclera clear.   ENT:   Well hydrated moist mucous membranes of the oral cavity.  Oropharhynx without any erythema or exudate.   Neck:   Supple without any enlargement, no thyromegaly.   Lymph Nodes:  No adenopathy (cervical, axillary, supraclavicular)   Cardiovascular:  Pulse normal rate and rhythm.  S1 and S2 normal.    Pulmonary:  Clear to auscultation bilaterally, without wheezes/crackles.  Good air movement.   Skin:    No petechiae, purpura or ecchymoses noted.   Musculoskeletal:   No bilateral cyanosis or clubbing.  No joint swelling, tenderness or erythema.    Neurological:  Alert and oriented to person, place and time.  A detailed neurological exam was not performed today.        Test Results  See results as above.

## 2021-08-19 ENCOUNTER — Institutional Professional Consult (permissible substitution): Admit: 2021-08-19 | Discharge: 2021-08-20 | Attending: Clinical | Primary: Clinical

## 2021-08-19 DIAGNOSIS — Z636 Dependent relative needing care at home: Principal | ICD-10-CM

## 2021-08-19 DIAGNOSIS — F418 Other specified anxiety disorders: Principal | ICD-10-CM

## 2021-09-03 ENCOUNTER — Ambulatory Visit: Admit: 2021-09-03 | Discharge: 2021-09-04 | Payer: MEDICAID

## 2021-09-03 DIAGNOSIS — J449 Chronic obstructive pulmonary disease, unspecified: Principal | ICD-10-CM

## 2021-09-03 MED ORDER — MONTELUKAST 10 MG TABLET
ORAL_TABLET | Freq: Every day | ORAL | 3 refills | 90.00000 days | Status: CP
Start: 2021-09-03 — End: 2022-09-03
  Filled 2021-09-08: qty 90, 90d supply, fill #0

## 2021-09-09 ENCOUNTER — Ambulatory Visit: Admit: 2021-09-09 | Discharge: 2021-09-10 | Payer: MEDICAID

## 2021-09-10 ENCOUNTER — Institutional Professional Consult (permissible substitution): Admit: 2021-09-10 | Payer: MEDICAID | Attending: Clinical | Primary: Clinical

## 2021-09-16 MED FILL — HYDROXYCHLOROQUINE 200 MG TABLET: ORAL | 90 days supply | Qty: 90 | Fill #2

## 2021-09-16 MED FILL — SPIRIVA RESPIMAT 2.5 MCG/ACTUATION SOLUTION FOR INHALATION: RESPIRATORY_TRACT | 30 days supply | Qty: 4 | Fill #4

## 2021-09-16 MED FILL — ALBUTEROL SULFATE HFA 90 MCG/ACTUATION AEROSOL INHALER: RESPIRATORY_TRACT | 33 days supply | Qty: 8.5 | Fill #1

## 2021-09-16 MED FILL — PILOCARPINE 5 MG TABLET: ORAL | 90 days supply | Qty: 270 | Fill #1

## 2021-09-18 DIAGNOSIS — M810 Age-related osteoporosis without current pathological fracture: Principal | ICD-10-CM

## 2021-09-26 ENCOUNTER — Ambulatory Visit: Admit: 2021-09-26 | Discharge: 2021-09-27 | Payer: MEDICAID

## 2021-09-26 DIAGNOSIS — M2550 Pain in unspecified joint: Principal | ICD-10-CM

## 2021-09-26 DIAGNOSIS — M3501 Sicca syndrome with keratoconjunctivitis: Principal | ICD-10-CM

## 2021-09-26 MED ORDER — PILOCARPINE 5 MG TABLET
ORAL_TABLET | Freq: Three times a day (TID) | ORAL | 3 refills | 90 days | Status: CP
Start: 2021-09-26 — End: 2022-09-26

## 2021-09-26 MED ORDER — HYDROXYCHLOROQUINE 200 MG TABLET
ORAL_TABLET | Freq: Every day | ORAL | 3 refills | 90.00000 days | Status: CP
Start: 2021-09-26 — End: 2022-09-26

## 2021-10-18 IMAGING — CR DG HIP (WITH OR WITHOUT PELVIS) 2-3V*R*
1 series · 3 of 3 positions shown · non-contrast
Comparison: None.

CLINICAL DATA: Fall with injury

EXAM:
DG HIP (WITH OR WITHOUT PELVIS) 2-3V RIGHT

[Series 1: dg hip unilat w or w/o pelvis 2-3 views  · non-contrast · 0.14mm/px · 3 of 3 slices shown]
[im 1/3]
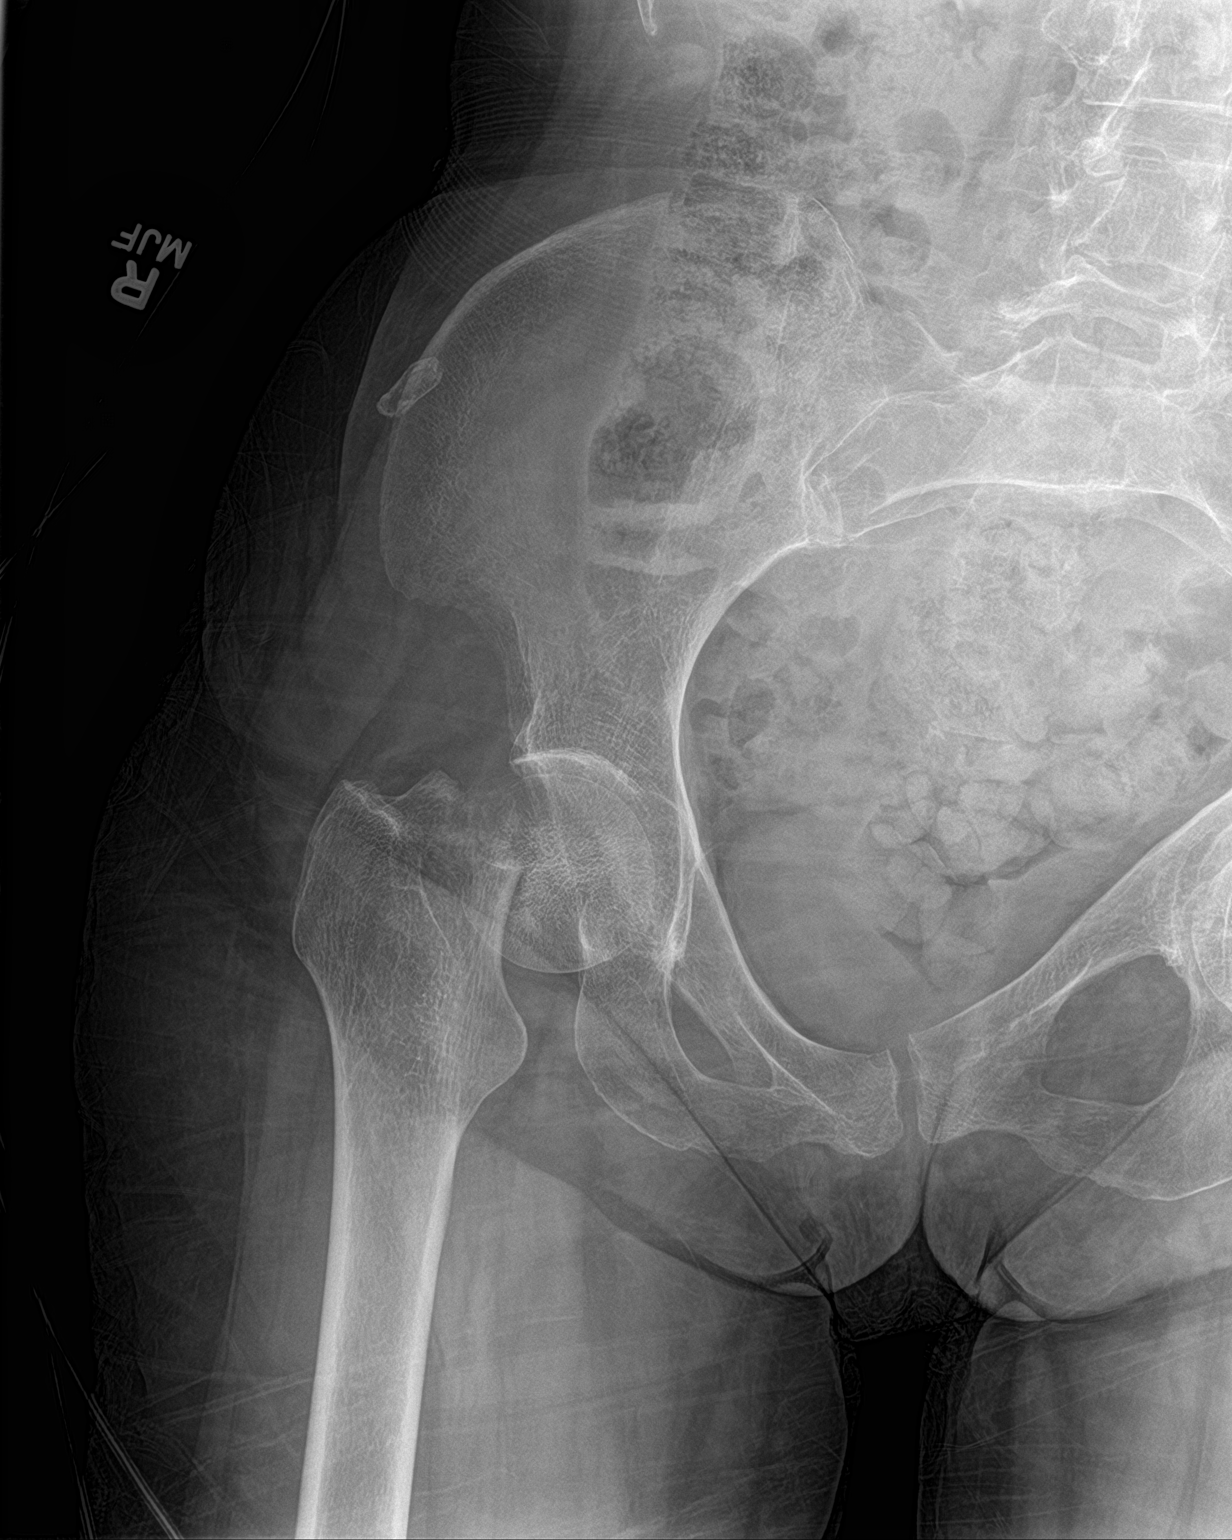
[im 2/3]
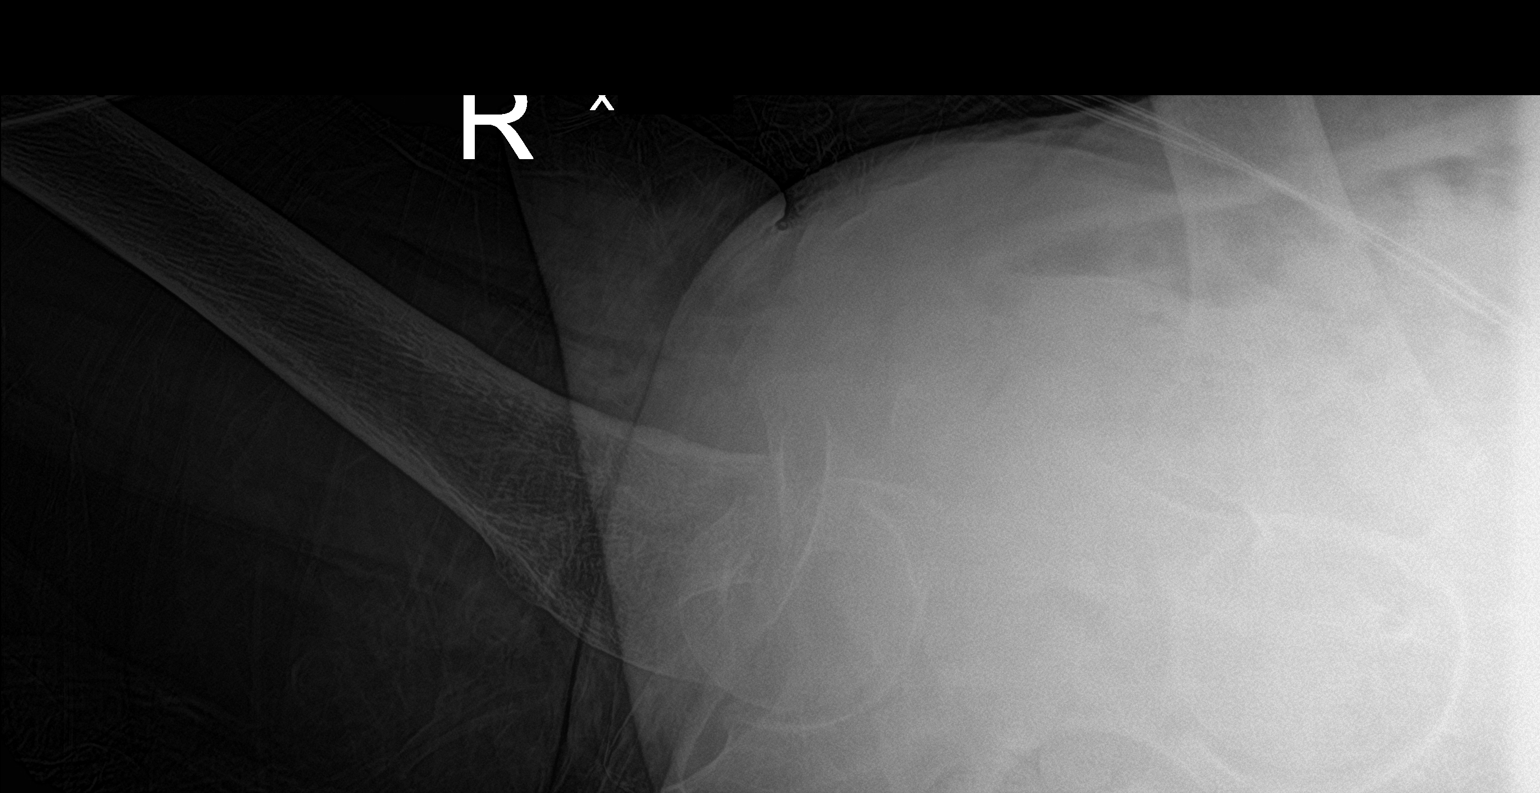
[im 3/3]
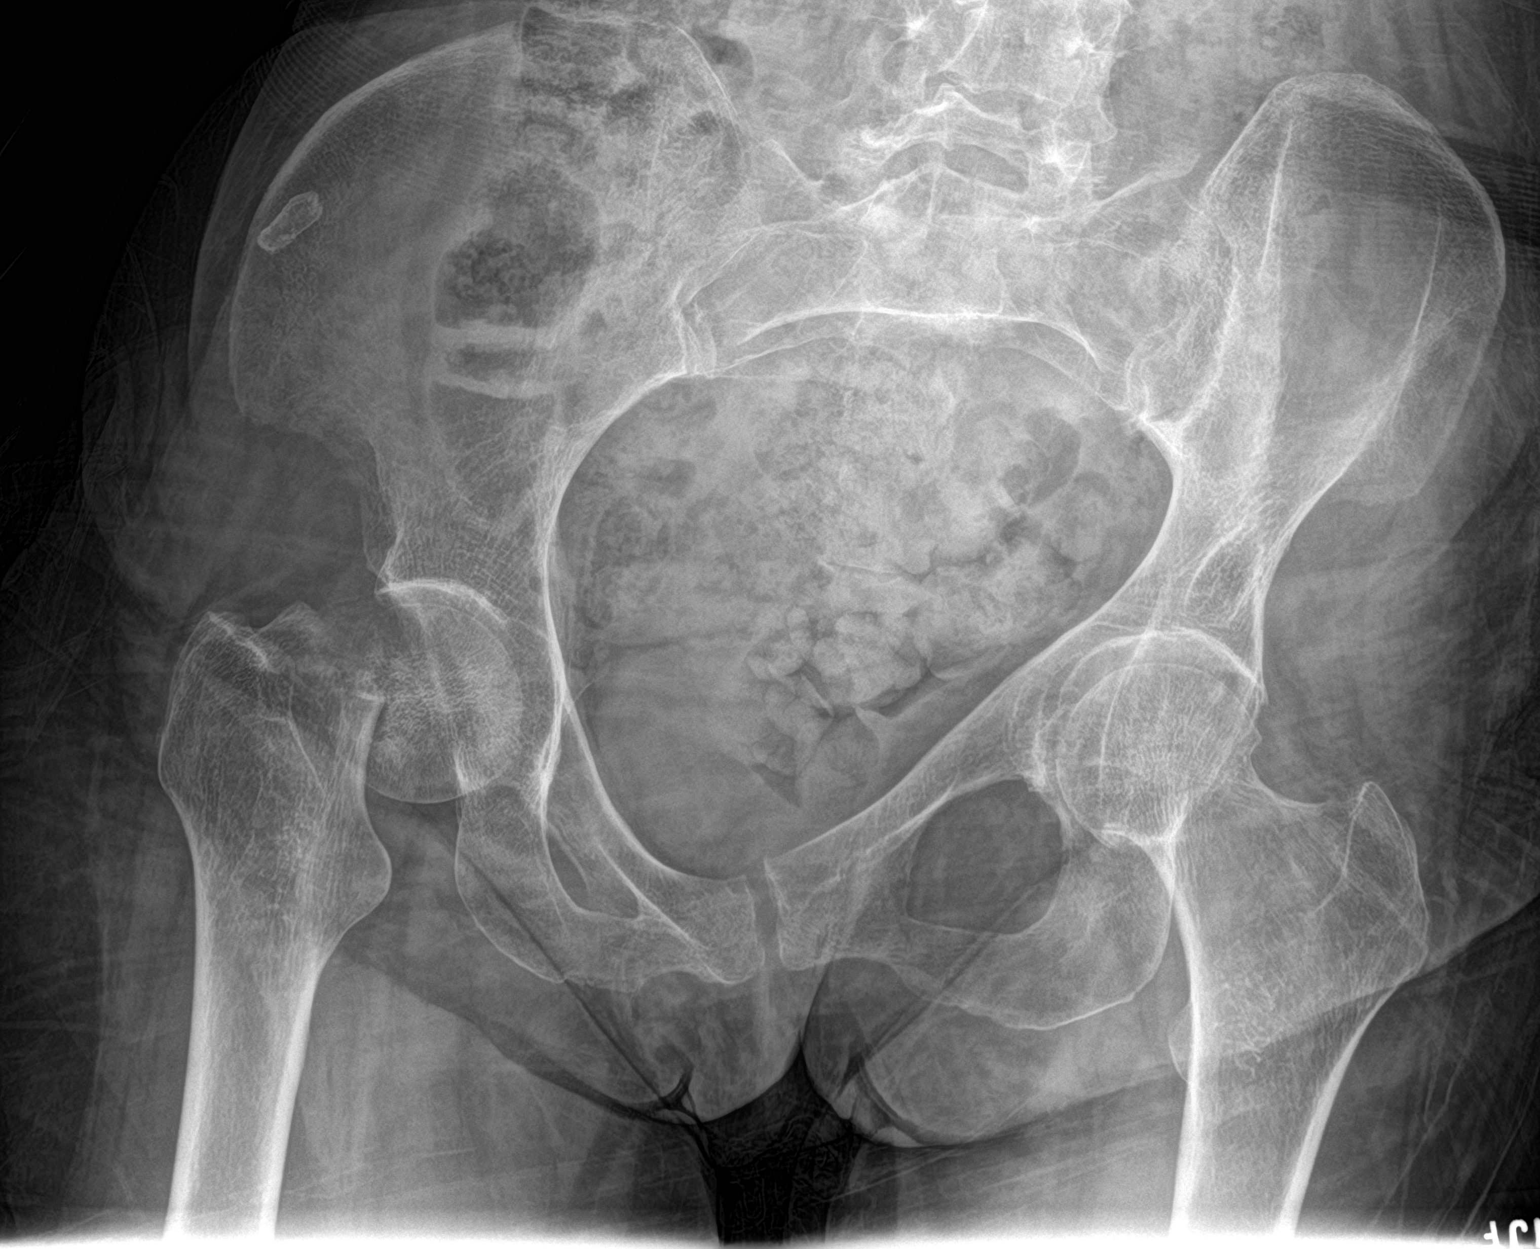

[3 of 3 positions shown; findings below may reference images not displayed]

FINDINGS: SI joints are non widened. Pubic symphysis and rami appear intact.
Acute subcapital right femoral neck fracture with cranial
displacement of the distal femur. No femoral head dislocation
IMPRESSION: Acute displaced right femoral neck fracture

## 2021-10-19 IMAGING — RF DG HIP (WITH PELVIS) OPERATIVE*R*
1 series · 2 of 2 positions shown · non-contrast
Comparison: Pelvic radiograph of June 26, 2021.

CLINICAL DATA: Post RIGHT hip arthroplasty in a patient with
history of hip fracture.

EXAM:
OPERATIVE RIGHT HIP (WITH PELVIS IF PERFORMED) 2 VIEWS
TECHNIQUE: Fluoroscopic spot image(s) were submitted for interpretation
post-operatively.
Fluoroscopy time: 24 seconds.

[Series 1: run · 2 of 2 slices shown]
[im 1/2]
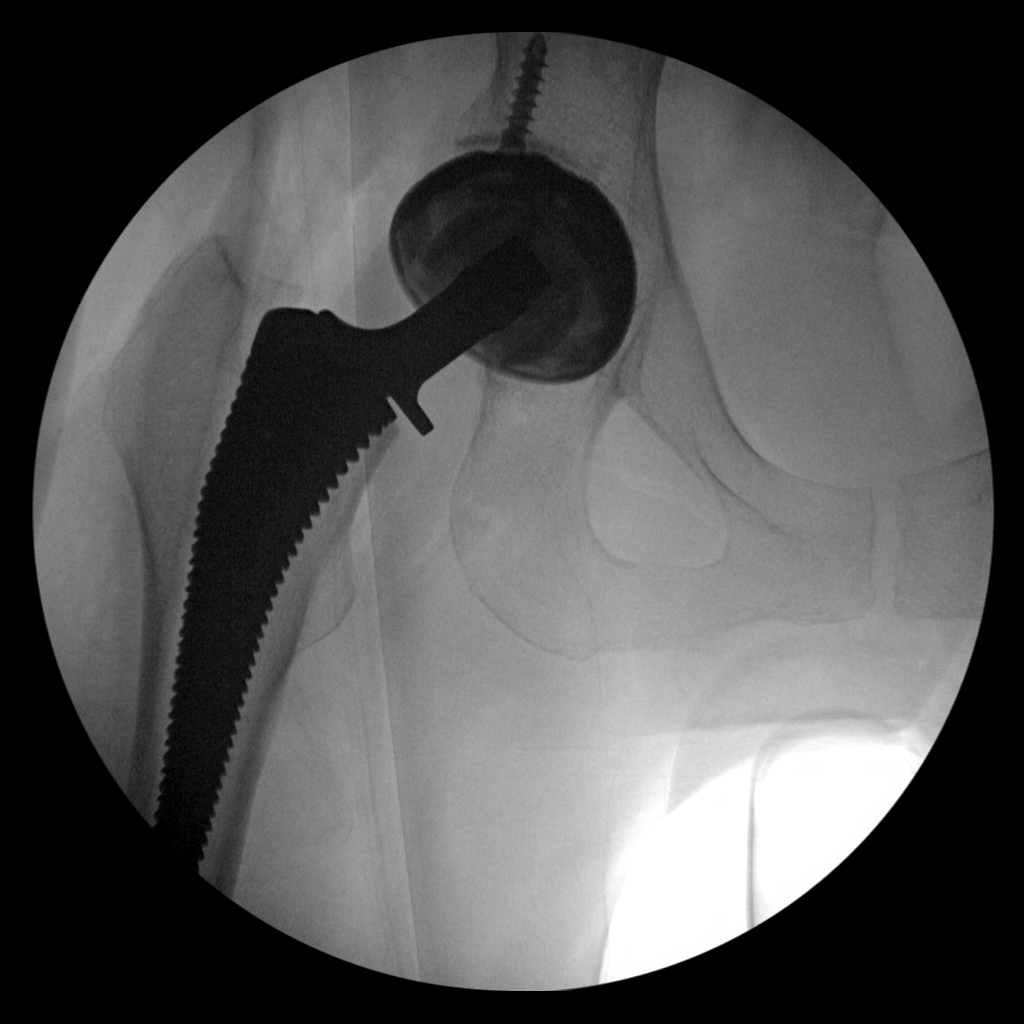
[im 2/2]
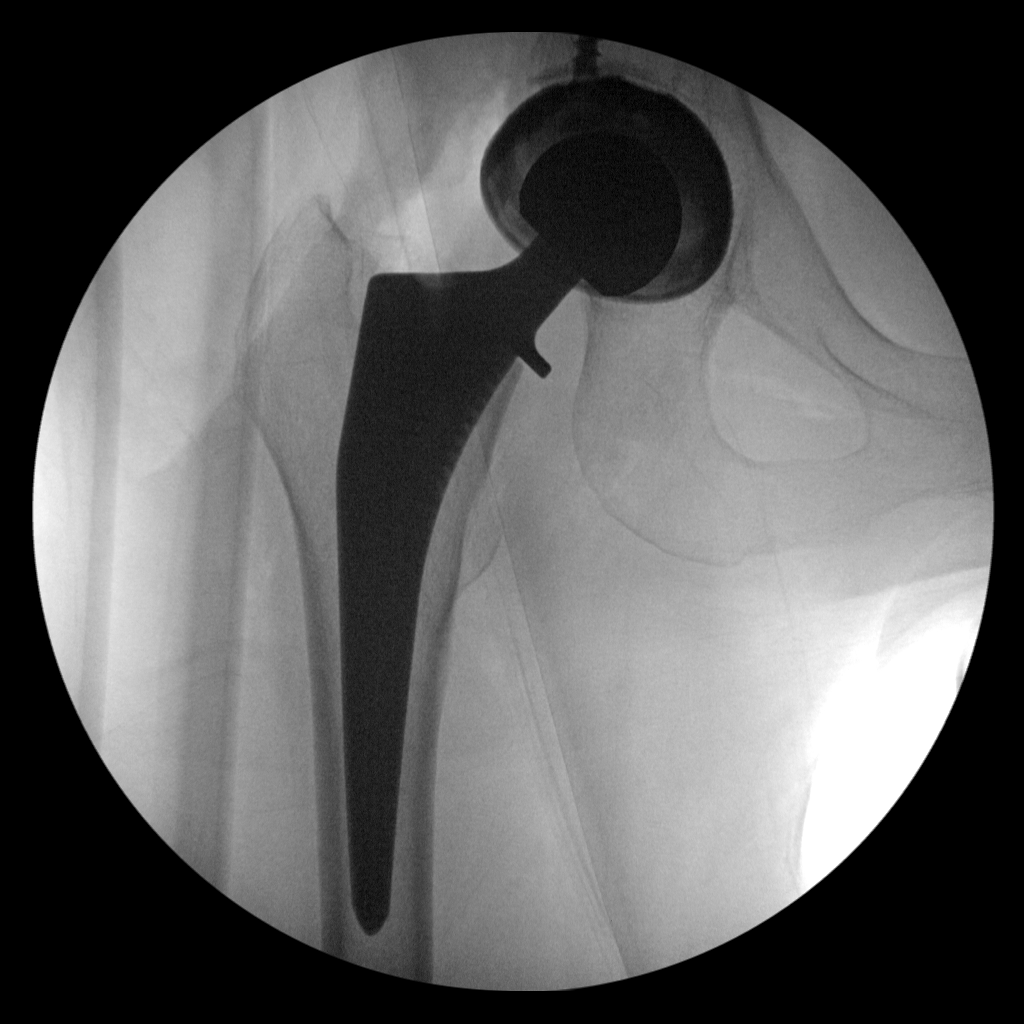

[2 of 2 positions shown; findings below may reference images not displayed]

FINDINGS: Two intraoperative spot radiographs of the RIGHT hip with signs of
RIGHT hip arthroplasty. No immediate complication identified on AP
projection.
IMPRESSION: Status post RIGHT hip arthroplasty. Limited intraoperative
fluoroscopic assessment without immediate complicating features.

## 2021-10-20 IMAGING — DX DG CHEST 1V PORT
1 series · 1 of 1 positions shown · non-contrast
Comparison: 05/09/2019

CLINICAL DATA: Recent hip surgery.  Fever.

EXAM:
PORTABLE CHEST 1 VIEW

[chest ap]
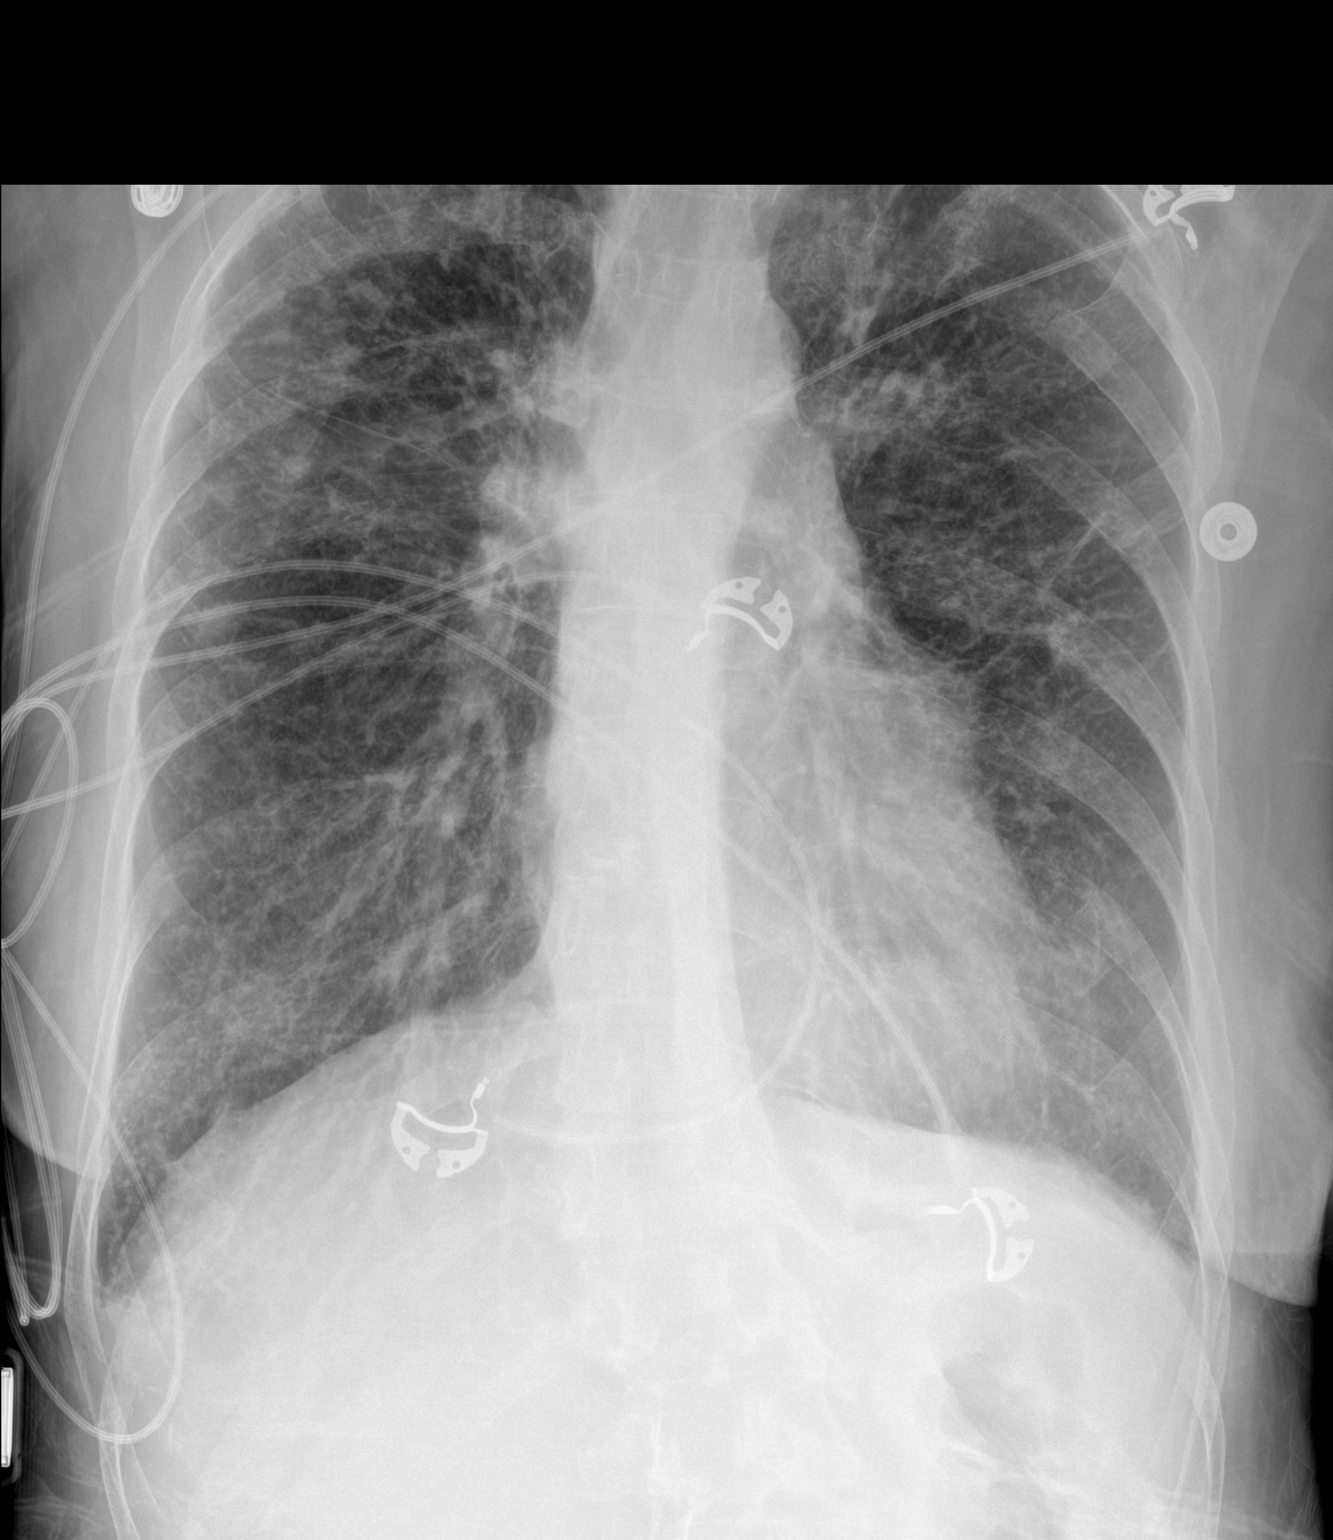

[1 of 1 positions shown; findings below may reference images not displayed]

FINDINGS: Chronic reticulonodular opacity which is diffuse. No superimposed
edema or consolidation seen. Normal heart size and mediastinal
contours.
IMPRESSION: Chronic lung disease. No visible pneumonia or focal atelectasis when
compared to 2828 radiograph.

## 2021-10-21 IMAGING — DX DG CHEST 1V PORT
1 series · 1 of 1 positions shown · non-contrast
Comparison: Yesterday

CLINICAL DATA: Sepsis

EXAM:
PORTABLE CHEST 1 VIEW

[chest ap]
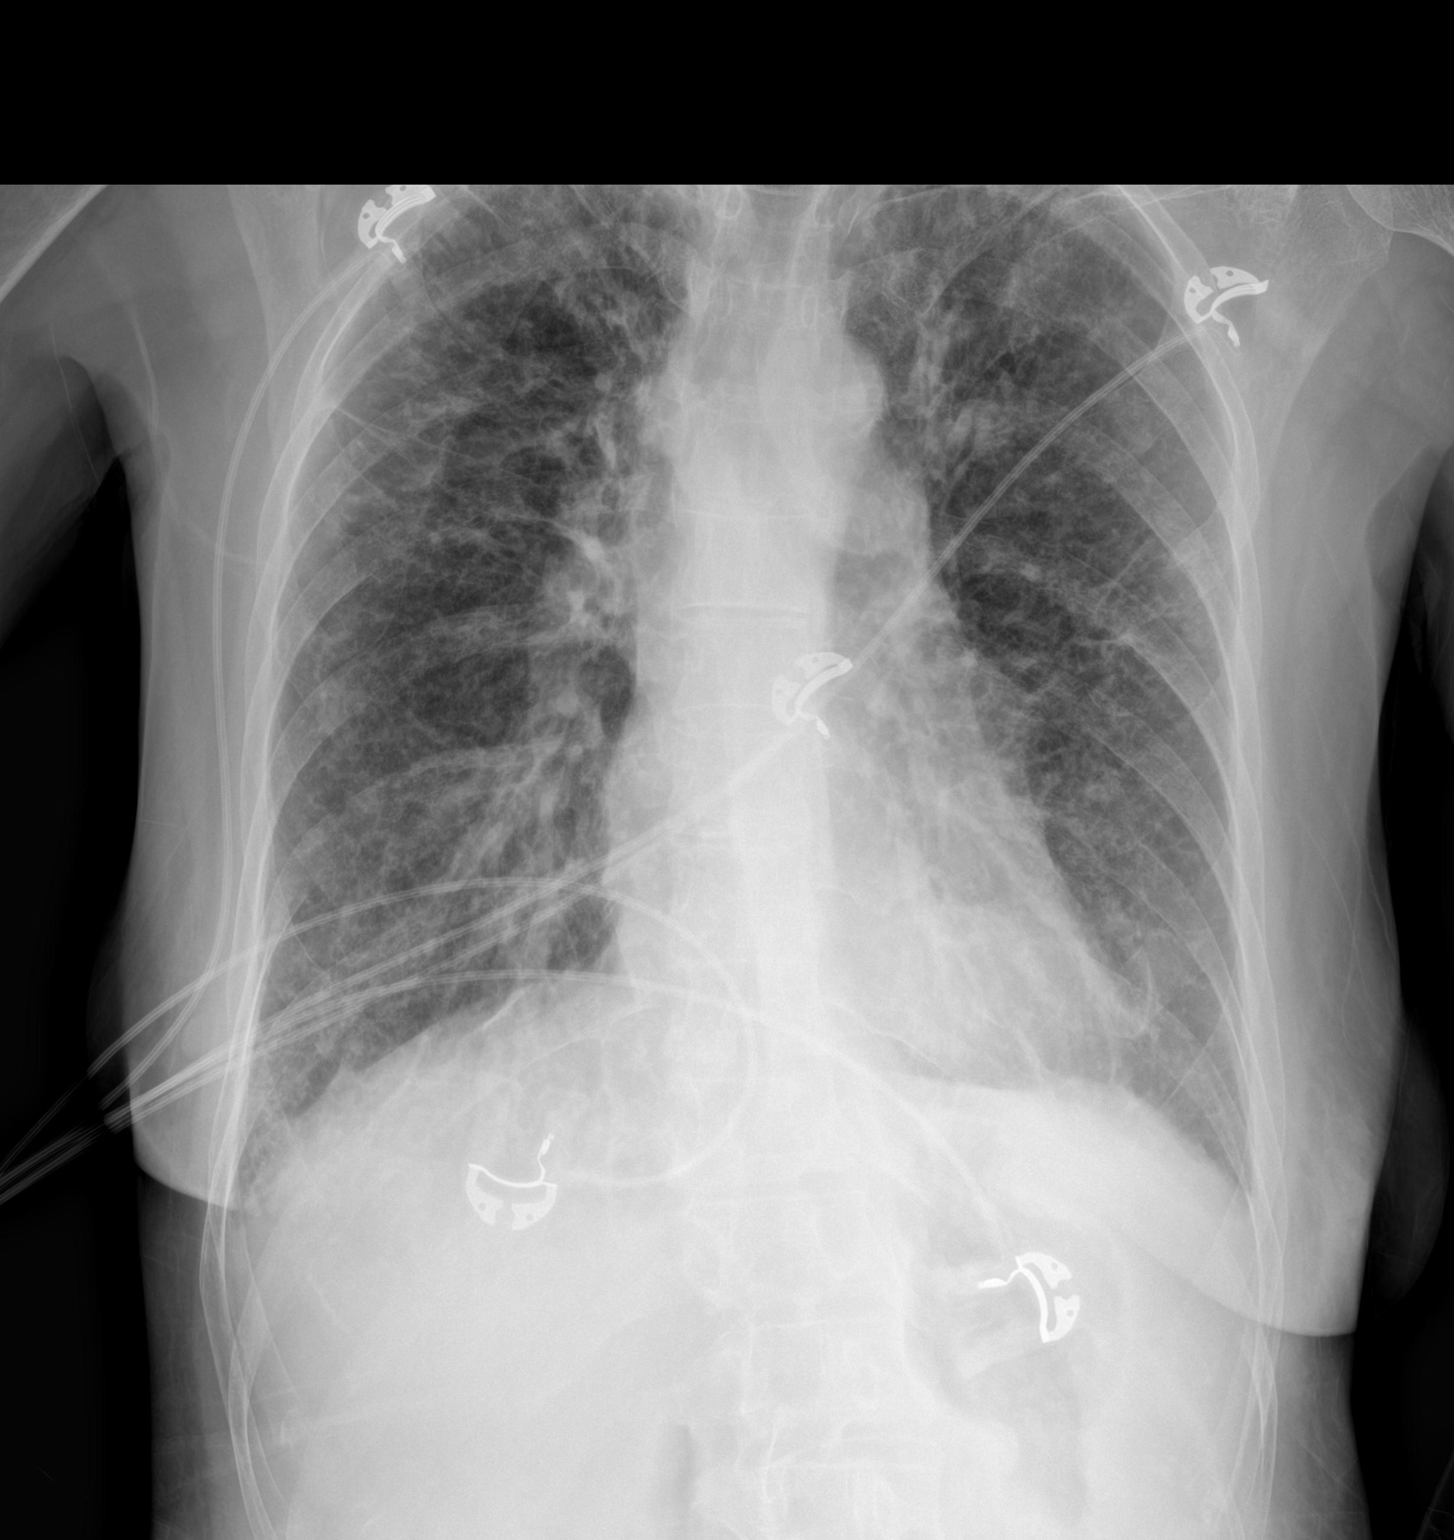

[1 of 1 positions shown; findings below may reference images not displayed]

FINDINGS: Diffuse but patchy reticular opacity which is chronic and similar to
priors. No pleural effusion or generalized Kerley lines. No air
leak. Normal heart size and mediastinal contours.
IMPRESSION: Chronic generalized reticular opacity. Bronchopneumonia could easily
be superimposed.

## 2021-10-22 DIAGNOSIS — F418 Other specified anxiety disorders: Principal | ICD-10-CM

## 2021-10-22 DIAGNOSIS — Z636 Dependent relative needing care at home: Principal | ICD-10-CM

## 2021-10-22 DIAGNOSIS — F4320 Adjustment disorder, unspecified: Principal | ICD-10-CM

## 2021-10-23 ENCOUNTER — Institutional Professional Consult (permissible substitution): Admit: 2021-10-23 | Discharge: 2021-10-23 | Payer: MEDICAID | Attending: Clinical | Primary: Clinical

## 2021-11-10 ENCOUNTER — Ambulatory Visit: Admit: 2021-11-10 | Discharge: 2021-11-11 | Payer: MEDICAID | Attending: Internal Medicine | Primary: Internal Medicine

## 2021-11-10 DIAGNOSIS — D72819 Decreased white blood cell count, unspecified: Principal | ICD-10-CM

## 2021-11-10 DIAGNOSIS — M3501 Sicca syndrome with keratoconjunctivitis: Principal | ICD-10-CM

## 2021-11-10 DIAGNOSIS — R1319 Other dysphagia: Principal | ICD-10-CM

## 2021-11-10 DIAGNOSIS — F418 Other specified anxiety disorders: Principal | ICD-10-CM

## 2021-11-10 DIAGNOSIS — M810 Age-related osteoporosis without current pathological fracture: Principal | ICD-10-CM

## 2021-11-10 DIAGNOSIS — Z1211 Encounter for screening for malignant neoplasm of colon: Principal | ICD-10-CM

## 2021-11-10 MED ORDER — HYDROXYZINE HCL 25 MG TABLET
ORAL_TABLET | Freq: Two times a day (BID) | ORAL | 2 refills | 30 days | Status: CP | PRN
Start: 2021-11-10 — End: 2022-11-10

## 2021-11-10 MED ORDER — SHINGRIX (PF) 50 MCG/0.5 ML INTRAMUSCULAR SUSPENSION, KIT
Freq: Once | INTRAMUSCULAR | 1 refills | 1.00000 days
Start: 2021-11-10 — End: 2021-11-10

## 2021-11-10 MED FILL — BUDESONIDE-FORMOTEROL HFA 160 MCG-4.5 MCG/ACTUATION AEROSOL INHALER: RESPIRATORY_TRACT | 30 days supply | Qty: 10.2 | Fill #2

## 2021-11-10 MED FILL — ALBUTEROL SULFATE HFA 90 MCG/ACTUATION AEROSOL INHALER: RESPIRATORY_TRACT | 33 days supply | Qty: 8.5 | Fill #2

## 2021-11-10 MED FILL — SPIRIVA RESPIMAT 2.5 MCG/ACTUATION SOLUTION FOR INHALATION: RESPIRATORY_TRACT | 30 days supply | Qty: 4 | Fill #5

## 2021-11-12 ENCOUNTER — Institutional Professional Consult (permissible substitution): Admit: 2021-11-12 | Discharge: 2021-11-13 | Payer: MEDICAID | Attending: Clinical | Primary: Clinical

## 2021-11-12 DIAGNOSIS — F418 Other specified anxiety disorders: Principal | ICD-10-CM

## 2021-11-12 DIAGNOSIS — F4321 Adjustment disorder with depressed mood: Principal | ICD-10-CM

## 2021-12-02 ENCOUNTER — Ambulatory Visit: Admit: 2021-12-02 | Discharge: 2021-12-03 | Payer: MEDICAID

## 2021-12-02 DIAGNOSIS — M8000XD Age-related osteoporosis with current pathological fracture, unspecified site, subsequent encounter for fracture with routine healing: Principal | ICD-10-CM

## 2021-12-02 DIAGNOSIS — M8000XA Age-related osteoporosis with current pathological fracture, unspecified site, initial encounter for fracture: Principal | ICD-10-CM

## 2021-12-02 MED ORDER — PEN NEEDLE, DIABETIC 31 GAUGE X 3/16" (5 MM)
Freq: Every day | 3 refills | 0.00000 days | Status: CP
Start: 2021-12-02 — End: 2022-12-02
  Filled 2022-02-19: qty 100, 90d supply, fill #0

## 2021-12-02 MED ORDER — ABALOPARATIDE 80 MCG/DOSE(3,120 MCG/1.56 ML) SUBCUTANEOUS PEN INJECTOR
Freq: Every day | SUBCUTANEOUS | 3 refills | 0 days | Status: CP
Start: 2021-12-02 — End: ?

## 2021-12-02 MED ORDER — TYMLOS (ABALOPARATIDE) 2000 MCG/ML INJECTION PEN
Freq: Every day | SUBCUTANEOUS | 3 refills | 75 days | Status: CP
Start: 2021-12-02 — End: 2021-12-02
  Filled 2022-02-19: qty 1.56, 28d supply, fill #0

## 2021-12-04 MED FILL — SPIRIVA RESPIMAT 2.5 MCG/ACTUATION SOLUTION FOR INHALATION: RESPIRATORY_TRACT | 30 days supply | Qty: 4 | Fill #6

## 2021-12-04 MED FILL — CITALOPRAM 40 MG TABLET: ORAL | 90 days supply | Qty: 90 | Fill #2

## 2021-12-04 MED FILL — BUDESONIDE-FORMOTEROL HFA 160 MCG-4.5 MCG/ACTUATION AEROSOL INHALER: RESPIRATORY_TRACT | 30 days supply | Qty: 10.2 | Fill #3

## 2021-12-11 ENCOUNTER — Encounter: Admit: 2021-12-11 | Discharge: 2021-12-11 | Payer: MEDICAID | Attending: Anesthesiology | Primary: Anesthesiology

## 2021-12-11 ENCOUNTER — Ambulatory Visit: Admit: 2021-12-11 | Discharge: 2021-12-11 | Payer: MEDICAID

## 2021-12-24 ENCOUNTER — Institutional Professional Consult (permissible substitution): Admit: 2021-12-24 | Discharge: 2021-12-24 | Payer: MEDICAID | Attending: Clinical | Primary: Clinical

## 2021-12-24 DIAGNOSIS — F4321 Adjustment disorder with depressed mood: Principal | ICD-10-CM

## 2021-12-24 DIAGNOSIS — F418 Other specified anxiety disorders: Principal | ICD-10-CM

## 2022-01-06 ENCOUNTER — Institutional Professional Consult (permissible substitution): Admit: 2022-01-06 | Discharge: 2022-01-07 | Payer: MEDICAID | Attending: Clinical | Primary: Clinical

## 2022-01-07 MED FILL — SPIRIVA RESPIMAT 2.5 MCG/ACTUATION SOLUTION FOR INHALATION: RESPIRATORY_TRACT | 30 days supply | Qty: 4 | Fill #7

## 2022-01-07 MED FILL — BUDESONIDE-FORMOTEROL HFA 160 MCG-4.5 MCG/ACTUATION AEROSOL INHALER: RESPIRATORY_TRACT | 30 days supply | Qty: 10.2 | Fill #4

## 2022-01-09 ENCOUNTER — Ambulatory Visit: Admit: 2022-01-09 | Discharge: 2022-01-10 | Payer: MEDICAID

## 2022-01-15 ENCOUNTER — Institutional Professional Consult (permissible substitution)
Admit: 2022-01-15 | Discharge: 2022-01-16 | Payer: MEDICAID | Attending: Student in an Organized Health Care Education/Training Program | Primary: Student in an Organized Health Care Education/Training Program

## 2022-01-15 DIAGNOSIS — M25552 Pain in left hip: Principal | ICD-10-CM

## 2022-01-15 DIAGNOSIS — M810 Age-related osteoporosis without current pathological fracture: Principal | ICD-10-CM

## 2022-01-21 ENCOUNTER — Institutional Professional Consult (permissible substitution): Admit: 2022-01-21 | Discharge: 2022-01-22 | Payer: MEDICAID | Attending: Clinical | Primary: Clinical

## 2022-01-28 ENCOUNTER — Institutional Professional Consult (permissible substitution): Admit: 2022-01-28 | Discharge: 2022-01-29 | Payer: MEDICAID | Attending: Clinical | Primary: Clinical

## 2022-01-28 DIAGNOSIS — F4321 Adjustment disorder with depressed mood: Principal | ICD-10-CM

## 2022-01-28 DIAGNOSIS — F418 Other specified anxiety disorders: Principal | ICD-10-CM

## 2022-01-30 MED FILL — MONTELUKAST 10 MG TABLET: ORAL | 90 days supply | Qty: 90 | Fill #1

## 2022-01-30 MED FILL — BUDESONIDE-FORMOTEROL HFA 160 MCG-4.5 MCG/ACTUATION AEROSOL INHALER: RESPIRATORY_TRACT | 30 days supply | Qty: 10.2 | Fill #5

## 2022-01-30 MED FILL — PILOCARPINE 5 MG TABLET: ORAL | 90 days supply | Qty: 270 | Fill #0

## 2022-02-11 ENCOUNTER — Institutional Professional Consult (permissible substitution): Admit: 2022-02-11 | Discharge: 2022-02-12 | Payer: MEDICAID | Attending: Clinical | Primary: Clinical

## 2022-02-11 DIAGNOSIS — F418 Other specified anxiety disorders: Principal | ICD-10-CM

## 2022-02-11 DIAGNOSIS — F4321 Adjustment disorder with depressed mood: Principal | ICD-10-CM

## 2022-02-17 ENCOUNTER — Ambulatory Visit: Admit: 2022-02-17 | Discharge: 2022-02-17 | Payer: MEDICAID | Attending: Internal Medicine | Primary: Internal Medicine

## 2022-02-17 ENCOUNTER — Ambulatory Visit: Admit: 2022-02-17 | Discharge: 2022-02-17 | Payer: MEDICAID

## 2022-02-17 DIAGNOSIS — M25552 Pain in left hip: Principal | ICD-10-CM

## 2022-02-17 DIAGNOSIS — M791 Myalgia, unspecified site: Principal | ICD-10-CM

## 2022-02-17 DIAGNOSIS — R1319 Other dysphagia: Principal | ICD-10-CM

## 2022-02-17 DIAGNOSIS — M3501 Sicca syndrome with keratoconjunctivitis: Principal | ICD-10-CM

## 2022-02-17 MED ORDER — CITALOPRAM 40 MG TABLET
ORAL_TABLET | Freq: Every day | ORAL | 2 refills | 90 days | Status: CP
Start: 2022-02-17 — End: 2023-02-17
  Filled 2022-04-06: qty 90, 90d supply, fill #0

## 2022-02-17 MED ORDER — TRAZODONE 50 MG TABLET
ORAL_TABLET | Freq: Every evening | ORAL | 2 refills | 180 days | Status: CP
Start: 2022-02-17 — End: 2022-03-19

## 2022-02-17 MED ORDER — HYDROXYZINE HCL 25 MG TABLET
ORAL_TABLET | Freq: Two times a day (BID) | ORAL | 2 refills | 30 days | Status: CP | PRN
Start: 2022-02-17 — End: 2023-02-17

## 2022-02-17 NOTE — Unmapped (Signed)
Internal Medicine Clinic Visit    Reason for Visit: Follow up     A/P:    Anxiety / Depression / Grief  Well controlled with medications below and therapy.   - Continue Celexa 40 mg daily, atarax 25 BID as needed for anxiety  - Continue trazodone 25 mg nightly     Osteoporosis - hip fracture 06/2021 s/p THA  Meets criteria by fragility fracture given sustained while falling from standing height. Completed PT. Recent DEXA with severe osteoporosis. Following with [facility].   - Message sent to Endocrinology regarding issues with getting abaloparatide covered     Esophageal dysphagia  EGD without explanation for dysphagia. May need manometry and would benefit from GI referral.   - Ambulatory referral to Gastroenterology; Future    Myalgia / Left hip pain / Gait abnormality / Spasticity   Her clinical picture still does not make sense to me. Seems to be lifelong component (abnormal gait since she was a child that was never evaluated) with an acute drop in 2019. 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 Plan to pursue LP as this would prompt them to consider [employer]. However, LP has unfortunately been delayed.   - CK  - Message sent to Neurology to discuss case - wondering about benefit of MRI lumbar spine. Will discuss with them before ordering. Will also discuss if they think Orthopedic Surgery could be helpful.   - XR hip     Sjogren's syndrome with keratoconjunctivitis sicca (CMS-HCC)  Follows with Rheumatology. +SSA/SSB, + ANA, + RF and lip biopsy with focal lymphocytic sialadenitis. Arthralgias that improved with Plaquenil.   - Continue Plaquenil 200 mg daily, Salagen 5 mg TID  - Comprehensive Metabolic Panel  - CBC w/ Differential    Asthma-COPD overlap / Bronchiectasis  Follows with Pulmonology. Continue current regimen    Return in about 1 month (around 03/20/2022).      Healthcare Maintenance  Cancer screening:  Colon: FIT ordered per patient preference   Cervical: done on 11/2020, no abnormalities, will repeat in 2027   Breast: most recent done on 02/2021; repeat 02/2022   Lung: will follow-up at next visit (recent CT from Pulm but need to verify smoking history)     Immunizations:  - Flu shot: UTD  - Pneumonia: Pneumovax 23 given 05/2020  - Shingrix: Instructed to get from pharmacy  - Tdap/Td: Due 2032  - COVID: UTD     Other:  - HIV screen: Non-reactive in 2022  - DEXA scan: will address at next visit  - HCV: Non-reactive in 2022  - Hemoglobin A1C: 5.4 in 2020  - Lipid panel: Last done 2022    I personally spent 59 minutes face-to-face and non-face-to-face in the care of this patient, which includes all pre, intra, and post visit time on the date of service.  __________________________________________________________    HPI:    66 year old with a history of Sjogren's syndrome, anxiety/depression/PTSD, leukopenia, gait instability, bronchiectasis and asthma/COPD overlap who presents today for follow up.     Regarding her mood, she is doing much better thanks to therapy Remains on Celexa 40. Uses atarax very rarely when she can't sleep. Still on trazodone 25 mg nightly. Overall feels she is in a much better place. Mood MUCH better.     Started on abaloparatide by Endocrinology. Waiting on manufacturer's assistance program.    Reports deep bone pain in her legs present for years. However worsened two months ago. Diagnosed with arthritis in the  past. Started around the time of her diagnosis of transverse myeltitis. Notes in her bilateral hips, knees. Hip replacement in right hip so does not have pain there she says. Hard to get out of the bed in the morning. Her posture has gotten worse. Daughter notes she is bent over in the morning when she goes to wake her up. Inactivity worsens symptoms. Can't sit in a chair for very long.     Reports a lifelong history of abnormal gait. Never went to a doctor when she was little. Never had pain. Just had a limp. Daughter reports at baseline, she would swing her right leg straight out and bear weight on the left. No prior injury. Daughter notes gait has always been off. In 2019, balance started getting worse. It was becoming difficult for her to go and do things. Pain started shortly after. Noted in bilateral hips the most. She had an episode of calling her daughter. Daughter noted she was not talking right and not making sense. Concerned for stroke. Went to the hospital [facility]) and had a CT scan. Nothing found on workup. They suspected a TIA. Daughter noted she was confused the whole day and then it resolved. After that, her balance was very bad (did not progressively worsen after this, it was just bad). Gained strength back via 5 months of PT. Then seen by [facility] Neurology. Plan was for LP but due to cancellations / reschedules / difficulty making appointments, this has not been done. No worsening in gait or symptoms.     Dysphagia: Food still gets stuck. Has to drink water to get it down. Points to chest as area where food gets stuck. Worse with meat and solids. Happens around 3 times a week. Started December 2022.     __________________________________________________________    Problem List:  Patient Active Problem List   Diagnosis    Abnormal gait    Anxiety    Arthralgia of hip    Family history of genetic disorder    Family history of malignant neoplasm of breast    Family history of malignant neoplasm of cervix    Mixed anxiety depressive disorder    Scoliosis    Sjogren's syndrome with keratoconjunctivitis sicca (CMS-HCC)    Leukopenia    Age-related nuclear cataract of both eyes    Poor sleep    Grief    Osteoporosis with current pathological fracture       Medications:  Reviewed in EPIC  __________________________________________________________    Physical Exam:   Vital Signs:  Vitals:    02/17/22 1105   BP: 132/65   BP Site: L Arm   BP Position: Sitting   BP Cuff Size: Medium   Pulse: 67   Resp: 16   Temp: 36.3 ??C (97.4 ??F)   TempSrc: Temporal   SpO2: 98%   Weight: 60.3 kg (133 lb)   Height: 170.2 cm (5' 7)        Body mass index is 20.83 kg/m??.    Gen: Well appearing, NAD  Ext: No edema  Neuro: Alert and oriented to situation. CN II-XII intact.  Strength 5/5 in bilateral upper extremities. Strength 4/5 in bilateral lower extremities. Patellar and brachioradialis reflexes 3+, biceps 2+. FTN and heel-to-shin intact bilaterally.

## 2022-02-17 NOTE — Unmapped (Signed)
[facility] at [facility]     Type of visit: face to face    Are you located in ? (for virtual visits only) N/A    Reason for visit: Follow up    Questions / Concerns that need to be addressed: Refills    Screening BP- 132/65        HCDM reviewed and updated in Epic:    We are working to make sure all of our patients??? wishes are updated in Epic and part of that is documenting a Environmental health practitioner for each patient  A Health Care Decision Maker is someone you choose who can make health care decisions for you if you are not able - who would you most want to do this for you????  is already up to date.    HCDM, [employer], FELL[place] - Daughter - [phone]    BPAs completed:  PHQ9  GAD7    COVID-19 Vaccine Summary  Which COVID-19 Vaccine was administered  Moderna  Type:  Dates Given:  09/26/2021                       Immunization History   Administered Date(s) Administered    COVID-19 VAC,BIVALENT(71YR UP)BOOST,MODERNA 09/26/2021    COVID-19 VACCINE,MRNA(MODERNA)(PF) 09/04/2020, 12/06/2020, 03/24/2021    Influenza Vaccine Quad (IIV4 PF) 65mo+ injectable 12/13/2019, 09/24/2020, 09/03/2021    PNEUMOCOCCAL POLYSACCHARIDE 23 06/18/2020    TdaP 05/27/2021       __________________________________________________________________________________________    SCREENINGS COMPLETED IN FLOWSHEETS    HARK Screening       AUDIT       PHQ2       PHQ9  Thoughts that you would be better off dead, or of hurting yourself in some way: Not at all  PHQ-9 TOTAL SCORE: 3    P4 Suicidality Screener                GAD7    Over the last 2 weeks, how often have you been bothered by the following problems?  Feeling nervous, anxious or on edge: Several days  Not being able to stop or control worrying: Not at all  Worrying too much about different things: Not at all  Trouble relaxing: Several days  Being so restless that it is hard to sit still: Not at all  Becoming easily annoyed or irritable: Not at all  Feeling afraid as if something awful might happen: Not at all  GAD-7 Total Score: 2  How difficult have these problems made it for you to do your work, take care of things at home, or get along with other people?: Not at all difficult    COPD Assessment       Falls Risk

## 2022-02-19 ENCOUNTER — Ambulatory Visit
Admit: 2022-02-19 | Discharge: 2022-02-20 | Payer: MEDICAID | Attending: Student in an Organized Health Care Education/Training Program | Primary: Student in an Organized Health Care Education/Training Program

## 2022-02-19 DIAGNOSIS — R269 Unspecified abnormalities of gait and mobility: Principal | ICD-10-CM

## 2022-02-19 MED ORDER — EMPTY CONTAINER
2 refills | 0 days
Start: 2022-02-19 — End: ?

## 2022-02-19 MED FILL — EMPTY CONTAINER: 120 days supply | Qty: 1 | Fill #0

## 2022-02-19 NOTE — Unmapped (Addendum)
[facility] Specialty Medication Onboarding    Specialty Medication: Tymlos (3,116mcg/1.56mL) pen injection  Prior Authorization: Not Required   Financial Assistance: No - copay  <$25  Final Copay/Day Supply: $4 / 30 days    Insurance Restrictions: None     Notes to Pharmacist: N/A    The triage team has completed the benefits investigation and has determined that the patient is able to fill this medication at [facility]. Please contact the patient to complete the onboarding or follow up with the prescribing physician as needed.

## 2022-02-19 NOTE — Unmapped (Signed)
Neurology Follow-up Visit Note     [facility] [address]  [facility] NEUROLOGY CLINIC [place] [address] Aquilla Okelley  [address]  [place] [address]  [phone]    Date: 02/19/2022   Patient Name: Christy Ibarra   MRN: [id number]   PCP: Johnsie Cancel  Referring Provider: Johnsie Cancel,*       Assessment and Plan          [name] is a 67 y.o. female with a past medical history of Sjogren's Syndrome presenting for follow-up of gait abnormality.     Gait abnormality:  She presented in 07/2019 with a rapid onset functional decline after sudden onset headache. 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 It is still unclear if the precipitating event was due to transverse myelitis that occurred earlier. Of note, she had an episode of back pain in 02/2018 which correlates with the onset of her symptoms. Since last seen she has improved and is now able to ambulate with her walker. She lives alone and attends to her ADLs.   - Agreeable to schedule the LP mpw with cell analysis, protein, glucose, OCB, and NMO. Added referral serum referral test for myelopathy (drawn 3/30).  If inflammatory component in CSF still present, then I will discuss potential switch plaquenil to cellcept with her rheumatologist.  - Previous MRI C spine in 2020 with central lesion thought to be artifact, however is present on more than one sequence. Will repeat MRI C spine to assess for changes    Return Visit Discussed: No follow-ups on file.    This patient was discussed with Dr. Trisha Mangle who agrees with the above assessment and plan.       Annalee Genta, MD  PGY-4  [facility] Department of Neurology         HPI         HPI: [name] is a 66 y.o. female who presents for follow up to the [place] [facility] for follow-up of gait abnormality. Patient was last seen by [facility] on 09/21/2019.      To review their relevant history, she presented with a 2 year history of progressive gait instability. In 2018, she initially felt a little wobbly while taking dancing classes. Her gait gradually worsened to the point where she began using a cane in 2019 and then required a walker since 01/2019. Since 04/2019, her weakness progressed to the point that she was unable to walk across her house without having to stop and she started using a wheelchair when going to the stores. She has not had a return to her prior baseline or any significant improvement at anytime since onset of symptoms. Additionally, she has experienced daily episodes of involuntary jerking movements in both of her legs (R>L) and reported several episodes in which her lower extremities stiffened in an extensor position with her toes curled. These episodes were painful and spontaneously resolved after 5 to 10 minutes. She does not feel stiff outside of these episodes. Since 06/2019, she has had intermittent episodes of electrical impulses shooting down her right arm and leg. These episodes have occurred 1 to 2 times per week. She does not notice any jerking movements, significant weakness, or issues with coordination in her upper extremities. She was diagnosed with Sjogeren's and  was treated with IVMP x5 days, then treated with 1 mg/kg prednisone with a 3 month taper prior to initiating plaquenil with her rheumatologist.    Interval history:  Since our last visit, she had continued improvement of her gait. Unfortunately, she had a fall with hip fracture on 06/26/2021 due to a slip which resulted in worsening gait. She continues to use a walker. She continues to have intermittent twitching of her legs which is improving and does not bother her.    Interval History 02/19/22:  - Feels her gait is no better or worse  - Her husband was diagnosed with dementia and recently passed in December. Because of these issues, they were not able to get to the LP.   - They feel the gait and weakness has improved, but not back at her baseline.   - one night in 2019 she went to bed at her 'baseline and woke up with an excruciating headache (20/10), and tried to get up and fell due to generalized weakness w/o lateralizing signs. She called her daughter and was not able to articulate her words (slurring, incomplete sentences). They went to the hospital and she was discharged with COPD but they were not able to find a reason for her sudden worsening. Prior to June she was limping and had balance issues but was able to walk, dance and attend to normal life though was needing a cane. After this night she was wheelchair bound. Has progressed to a rolling walker since about Jan 2021 (her last PT visit).   - She has a h/o Sjogrens and is on Plaquenil. Also recently saw her PCP for dysphagia and was diagnosed with gastritis via endoscopy. EMG 09/2019 without evidence of large fiber neuropathy, mononeuropathy, or myopathy. MRI's of brain, C and T spine did not show any stroke or lesion.  - She c/o widespread joint pain.       Allergies   Allergen Reactions   ??? [facility] Dust Cough        Current Outpatient Medications   Medication Sig Dispense Refill   ??? abaloparatide 80 mcg (3,120 mcg/1.56 mL) PnIj Inject 80 mcg under the skin daily. 1.56 mL 3   ??? albuterol 2.5 mg /3 mL (0.083 %) nebulizer solution Inhale 3 mL (2.5 mg total) by nebulization every six (6) hours as needed for wheezing or shortness of breath. 360 mL 11   ??? albuterol HFA 90 mcg/actuation inhaler Inhale 1 puff every four (4) hours as needed for wheezing. 8.5 g 11   ??? budesonide-formoteroL (SYMBICORT) 160-4.5 mcg/actuation inhaler Inhale 2 puffs Two (2) times a day. 10.2 g 11   ??? citalopram (CELEXA) 40 MG tablet Take 1 tablet (40 mg total) by mouth daily. 90 tablet 2   ??? empty container (SHARPS CONTAINER) Misc Use as directed 1 each 2   ??? hydrOXYchloroQUINE (PLAQUENIL) 200 mg tablet Take 1 tablet (200 mg total) by mouth daily. 90 tablet 3   ??? hydrOXYzine (ATARAX) 25 MG tablet Take 1 tablet (25 mg total) by mouth two (2) times a day as needed. 60 tablet 2   ??? ipratropium (ATROVENT) 0.02 % nebulizer solution Inhale the contents of 1 vial (500 mcg total) by nebulization Four (4) times a day. 62.5 mL 12   ??? montelukast (SINGULAIR) 10 mg tablet Take 1 tablet (10 mg total) by mouth daily. 90 tablet 3   ??? pen needle, diabetic 31 gauge x 3/16 (5 mm) Ndle 1 each by Miscellaneous route in the  morning. 100 each 3   ??? pilocarpine (SALAGEN) 5 MG tablet Take 1 tablet (5 mg total) by mouth Three (3) times a day. 270 tablet 3   ??? tiotropium bromide (SPIRIVA RESPIMAT) 2.5 mcg/actuation inhalation mist Inhale 1 puff daily. 4 g 11   ??? traZODone (DESYREL) 50 MG tablet Take 0.5 tablets (25 mg total) by mouth nightly. 90 tablet 2   ??? triamcinolone (KENALOG) 0.1 % cream Apply topically Two (2) times a day. Apply to back for itching 45 g 0     No current facility-administered medications for this visit.       Past Medical History:   Diagnosis Date   ??? Anxiety    ??? Caregiver stress 06/23/2021   ??? Cataract    ??? COPD (chronic obstructive pulmonary disease) (CMS-HCC)    ??? Depression    ??? Dry eyes    ??? Joint pain    ??? Scoliosis        Past Surgical History:   Procedure Laterality Date   ??? HYSTERECTOMY     ??? PR BRONCHOSCOPY,DIAGNOSTIC W LAVAGE Bilateral 09/20/2020    Procedure: BRONCHOSCOPY, RIGID OR FLEXIBLE, INCLUDE FLUOROSCOPIC GUIDANCE WHEN PERFORMED; W/BRONCHIAL ALVEOLAR LAVAGE WITH MODERATE SEDATION;  Surgeon: Sheran Spine, MD;  Location: BRONCH PROCEDURE LAB [facility];  Service: Pulmonary   ??? PR SUBTOT REMV VITREOUS,MECH VIRECTOMY Right 08/26/2020    Procedure: REMOVAL OF VITREOUS, ANTERIOR APPROACH(OPEN SKY TECHNIQUE OR LIMBAL INCISION); SUBTL REMOVAL W/MECH VITRECT;  Surgeon: Jeannene Patella, MD;  Location: [facility] OR [facility];  Service: Ophthalmology   ??? PR UPPER GI ENDOSCOPY,BIOPSY N/A 12/11/2021    Procedure: UGI ENDOSCOPY; WITH BIOPSY, SINGLE OR MULTIPLE;  Surgeon: Marene Lenz, MD;  Location: GI PROCEDURES MEMORIAL [facility];  Service: Gastroenterology   ??? PR XCAPSL CTRC RMVL INSJ IO LENS PROSTH W/O ECP Left 08/12/2020    Procedure: EXTRACAPSULAR CATARACT REMOVAL W/INSERTION OF INTRAOCULAR LENS PROSTHESIS, MANUAL OR MECHANICAL TECHNIQUE WITHOUT ENDOSCOPIC CYCLOPHOTOCOAGULATION;  Surgeon: Jeannene Patella, MD;  Location: [facility] OR [facility];  Service: Ophthalmology   ??? PR XCAPSL CTRC RMVL INSJ IO LENS PROSTH W/O ECP Right 08/26/2020    Procedure: EXTRACAPSULAR CATARACT REMOVAL W/INSERTION OF INTRAOCULAR LENS PROSTHESIS, MANUAL OR MECHANICAL TECHNIQUE WITHOUT ENDOSCOPIC CYCLOPHOTOCOAGULATION;  Surgeon: Jeannene Patella, MD;  Location: [facility] OR [facility];  Service: Ophthalmology       Social History     Socioeconomic History   ??? Marital status: Married     Spouse name: Ezequiel Ganser   ??? Number of children: 2   ??? Years of education: 39   ??? Highest education level: High school graduate   Tobacco Use   ??? Smoking status: Former     Packs/day: 2.00     Years: 25.00     Pack years: 50.00     Types: Cigarettes   ??? Smokeless tobacco: Never   Vaping Use   ??? Vaping Use: Never used   Substance and Sexual Activity   ??? Alcohol use: Not Currently   ??? Drug use: Never   Social History Narrative    Date information was obtained:  06/23/21        Born in [place]    Raised with both parents     Parents deceased: Mother died age 29 due to ovarian cancer. Father died age 70 due to a stroke.    Siblings - an older brother and older sister        Relocated to [place]: yes  If relocated, came to [place]: year: 33   Reason:  Marriage to husband        Residential:    Who lives in the home currently?: Patient and her husband    Housing: own housing    Risk of losing current housing:  no    Homelessness: yes - 1979 -1980    Eviction: no         Food Insecurity: No currently since getting food stamps as of June '22        Transportation: car driven by family        Marital/partner status: Married to husband, Ezequiel Ganser for 41 years. Husband is 61 yrs old.    Sexually active: no     Related arguments:  no         Children: (number and ages, living with) - Daughter age 60, [name], and another daughter age 1, Tresa Endo. Also has stepchildren    Grandchildren - granddaughter age 16 and grandson age 62        Education    Through what year did you complete in school: high school graduate    History of Learning Disability or Learning Difficulty: yes - difficulty processing and understanding        Work    Income/Employment/Disability: Futures trader. 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 Difficulty of Paying Living Expenses: Hard   Food Insecurity: Food Insecurity Present   ??? Worried About Programme researcher, broadcasting/film/video in the Last Year: Sometimes true   ??? Ran Out of Food in the Last Year: Sometimes true   Transportation Needs: No Transportation Needs   ??? Lack of Transportation (Medical): No   ??? Lack of Transportation (Non-Medical): No       Family History   Problem Relation Age of Onset   ??? Cancer Mother    ??? Ovarian cancer Mother    ??? Breast cancer Mother 73   ??? Diabetes Father    ??? Heart disease Father    ??? Hypertension Father    ??? Osteoporosis Sister    ??? Colon cancer Neg Hx    ??? Endometrial cancer Neg Hx           Objective          Physical Exam:  General Appearance:Well appearing. In no acute distress.  HEENT: Head is atraumatic and normocephalic. Sclera anicteric without injection.  Lungs: Normal work of breathing    Neurological Examination:     Mental Status: Alert, conversant, able to follow conversation and interview. Spontaneous speech was fluent without word finding pauses, dysarthria, or paraphasic errors. Comprehension was intact. Memory for recent and remote events was intact.    Cranial Nerves: PERRL. Pursuit eye movements were uninterrupted with full range and without more than end-gaze nystagmus. Facial sensation intact bilaterally to light touch in all three divisions of CNV. Face symmetric at rest. Normal facial movement bilaterally, including forehead, eye closure and grimace/smile. Hearing intact to conversation. Shoulder shrug full strength bilaterally.  Palate movement is symmetric. Tongue protrudes midline and tongue movements are normal.     Motor Exam: Normal bulk.  No tremors, myoclonus, or other adventitious movement.  Pronator drift present on left upper extremity.  UE R/L: deltoid 5/5, biceps 5/5, triceps 5/5 and hand grip strong/strong.  LE R/L: hip flexion 4+/4+, quadriceps 4+/4+, hamstrings 4+/4+, dorsiflexion 5/5 and plantar flexion 5/5.    Reflexes: DTRs are 2+ and symmetric in BUE and achiles. 3+ and symmetric in bilateral patella. Hoffmann's sign is absent bilaterally.    Sensory:   - sensation normal to light touch in BUE and BLE  - patchy decreased temperature throughout, though worse distally in arms and legs  - mildly decreased vibration in bilateral toes (worse on L than R), intact in fingers    Cerebellar/Coordination/Gait: Finger-to-nose is normal without ataxia or dysmetria bilaterally. Heel-to-shin is normal without ataxia or dysmetria bilaterally. Gait exam demonstrates shuffling gait with good turns.

## 2022-02-19 NOTE — Unmapped (Signed)
They will call you to schedule the MRI. Please try and take some pain medication before so you can remain as still as possible during the scan.     Please schedule the lumbar puncture.     There is one blood lab we will have drawn today before you leave.

## 2022-02-19 NOTE — Unmapped (Signed)
[facility]   Patient Onboarding/Medication Counseling    Ms.Nicole is a 66 y.o. female with Osteoporosis with current pathological fracture who I am counseling today on initiation of therapy.  I am speaking to the patient.    Was a Nurse, learning disability used for this call? No    Verified patient's date of birth / HIPAA.    Specialty medication(s) to be sent: General Specialty: Tymlos      Non-specialty medications/supplies to be sent: Pen needles, Sharps contianer      Medications not needed at this time: n/a         Tymlos (Abaloparatide)    Medication & Administration     Dosage: Inject 80 mcg under the skin daily.    ??? **Requires pen needles: Pen needles not provided with Tymlos pen. Use 31 gauge; 5-8 mm pen needles with Tymlos pen.    Administration:   1. Check Tymlos pen for expiration date and damages  2. Wash hands soap and warm water  3. Pull pen cap off Tymlos and check cartridge - liquid should be clear, colorless, and free of particles  4. Pull of protective paper from the outer needle cap of your pen needle.  5. Keep the needle straight and screw it onto the pen until fixed; a secure fit is ensured even if there is no noticeable stop. Do not over-tighten  6. Pull off the OUTER pen needle cap from the pen needle and keep it to use after your injection  7. Carefully pull off the INNER pen needle cap and dispose of it  8. Prime the pen for the day 1 first use only:  a. Turn the dose knob on your TYMLOS pen away from you (clockwise) until it stops. You will see ??? ? 80 ??? lined up in the dose display window  b. Hold the TYMLOS pen with the pen needle pointing up. Tap lightly with your finger on the cartridge holder to move any air bubbles in the cartridge to the top of the cartridge.  c. Press the green injection button until it will not go any further (You will see liquid come out of the needle tip and you will see ?0??? lined up in the dose display window) - if you do not see liquid come out then repeat steps a-c.  If still no liquid call pharmacist or use new pen.  9. Set the dose by turning the dose knob on your pen away from you (clockwise) until the dose knob stops and ????80??? is lined up in the dose display window (If not able to set dose to 80 then either not enough medication in pen or if new pen then use another pen)  10. Injections should be given in the lower stomach area (abdomen). Avoid the 2-inch area around your belly button (navel).  a. For each injection, change (rotate) your injection site around your abdomen.  b. Do not inject into areas where the skin is tender, bruised, red, scaly, or hard. Avoid areas with scars or stretch marks  11. Wipe the injection site with an alcohol swab and allow it to dry. Do not touch, fan, or blow on the injection site after you have cleaned it.  12. Hold the pen so you can see the dose display window during the injection and insert the pen needle straight into your skin.  13. Press the green injection button until it cannot go any further and ????0??? is in the dose display window.  a. Do not move the TYMLOS pen after inserting the needle.  b. Continue to press the green injection button while counting to 10. Counting to 10 will allow the full dose of TYMLOS to be given  14. After counting to 10, release your finger from the green injection button and slowly remove the pen from the injection site by pulling the pen needle straight out.  15. Carefully place the outer needle cap back on the pen needle. Press on the outer needle cap until it snaps into place and is secure.  16. To prevent needle stick injury: Unscrew the capped needle (like unscrewing a cap from a bottle). To unscrew the capped needle you may need to turn it 8 or more turns and then gently pull until the capped needle comes off.   a. Do not push on the outer needle cap while unscrewing the needle. You should see a gap widening between the outer needle cap and the pen as you unscrew the needle.  17. Firmly replace the pen cap. Keep the pen cap on your TYMLOS pen between injections  18. Press a cotton ball or gauze pad over the injection site and hold for 10 seconds. Do not rub the injection site. You may have slight bleeding. This is normal. You may cover the injection site with a small adhesive bandage, if needed  19. Throw away your TYMLOS pen, in a sharps container, 30 days after first opening it even if it still contains unused medicine. Put your used pen needles in a sharps disposal container right away after use.    Adherence/Missed dose instructions:  ??? Take a missed dose as soon as you think about it on the same day you missed the dose.  ??? If you do not think about the missed dose until the next day, skip the missed dose and go back to your normal time.  ??? Do not take more than 1 dose of this drug in the same day    Goals of Therapy     Osteoporosis: Treatment of postmenopausal females with osteoporosis at high risk for fracture May also be used in patients who have failed or are intolerant to other available osteoporosis therapy    Side Effects & Monitoring Parameters     Common side effects:  ??? Injection site reaction: redness or irritation, swelling or pain  ??? Feeling dizzy, tired, or weak  ??? Headache  ??? Upset stomach or stomach pain    The following side effects should be reported to the provider:  ??? Allergic reaction  ??? Signs of high calcium levels, like weakness, confusion, feeling tired, vomiting, constipation, or bone pain  ??? Very bad dizziness or passing out  ??? Fast or abnormal heartbeat  ??? Muscle weakness  ??? Pain when passing urine  ??? Back pain, belly pain, or blood in urine (may be sings of a kidney stone)    Monitoring Parameters:  ??? Orthostatic hypotension  ??? Serum calcium  ??? Urinary calcium  ??? Bone mineral density (BMD) should be evaluated at baseline and ~1 to 2 years following initiation of therapy     Contraindications, Warnings, & Precautions     ??? Orthostatic hypotension: low blood pressure when you stand up from sitting down may occur within 4 hours of dosing.  Be careful when standing from sitting position  ??? Osteosarcoma: [US Boxed Warning]: In animal studies, abaloparatide has been associated with an increase in osteosarcoma; risk was dependent on both dose and duration.  Avoid use in patients with an increased risk of osteosarcoma (including Paget disease, bone metastases or skeletal malignancies, hereditary disorders predisposing to osteosarcoma, unexplained elevation of alkaline phosphatase, prior external beam or implant radiation therapy involving the skeleton, or in patients with open epiphyses).    Drug/Food Interactions     ??? Medication list reviewed in Epic. The patient was instructed to inform the care team before taking any new medications or supplements. No drug interactions identified.     Storage, Handling Precautions, & Disposal     ??? Before first use, store refrigerated at 2??C to 8??C (36??F to 46??F)  ??? After first use, store for up to 30 days at room temperature (20??C to 25??C or 68??F to 45??F)  ??? Store with pen cap on but do not store with needle on.  ??? Discard Tymlos after 30 days of first opening (write down date of first use on pen/box).      Current Medications (including OTC/herbals), Comorbidities and Allergies     Current Outpatient Medications   Medication Sig Dispense Refill   ??? abaloparatide 80 mcg (3,120 mcg/1.56 mL) PnIj Inject 80 mcg under the skin daily. 1.56 mL 3   ??? albuterol 2.5 mg /3 mL (0.083 %) nebulizer solution Inhale 3 mL (2.5 mg total) by nebulization every six (6) hours as needed for wheezing or shortness of breath. 360 mL 11   ??? albuterol HFA 90 mcg/actuation inhaler Inhale 1 puff every four (4) hours as needed for wheezing. 8.5 g 11   ??? budesonide-formoteroL (SYMBICORT) 160-4.5 mcg/actuation inhaler Inhale 2 puffs Two (2) times a day. 10.2 g 11   ??? citalopram (CELEXA) 40 MG tablet Take 1 tablet (40 mg total) by mouth daily. 90 tablet 2   ??? hydrOXYchloroQUINE (PLAQUENIL) 200 mg tablet Take 1 tablet (200 mg total) by mouth daily. 90 tablet 3   ??? hydrOXYzine (ATARAX) 25 MG tablet Take 1 tablet (25 mg total) by mouth two (2) times a day as needed. 60 tablet 2   ??? ipratropium (ATROVENT) 0.02 % nebulizer solution Inhale the contents of 1 vial (500 mcg total) by nebulization Four (4) times a day. 62.5 mL 12   ??? montelukast (SINGULAIR) 10 mg tablet Take 1 tablet (10 mg total) by mouth daily. 90 tablet 3   ??? pen needle, diabetic 31 gauge x 3/16 (5 mm) Ndle 1 each by Miscellaneous route in the morning. 100 each 3   ??? pilocarpine (SALAGEN) 5 MG tablet Take 1 tablet (5 mg total) by mouth Three (3) times a day. 270 tablet 3   ??? traZODone (DESYREL) 50 MG tablet Take 0.5 tablets (25 mg total) by mouth nightly. 90 tablet 2   ??? triamcinolone (KENALOG) 0.1 % cream Apply topically Two (2) times a day. Apply to back for itching 45 g 0     No current facility-administered medications for this visit.       Allergies   Allergen Reactions   ??? [facility] Dust Cough       Patient Active Problem List   Diagnosis   ??? Abnormal gait   ??? Anxiety   ??? Arthralgia of hip   ??? Family history of genetic disorder   ??? Family history of malignant neoplasm of breast   ??? Family history of malignant neoplasm of cervix   ??? Mixed anxiety depressive disorder   ??? Scoliosis   ??? Sjogren's syndrome with keratoconjunctivitis sicca (CMS-HCC)   ??? Leukopenia   ??? Age-related nuclear cataract of  both eyes   ??? Poor sleep   ??? Grief   ??? Osteoporosis with current pathological fracture       Reviewed and up to date in Epic.    Appropriateness of Therapy     Acute infections noted within Epic:  No active infections  Patient reported infection: None    Is medication and dose appropriate based on diagnosis and infection status? Yes    Prescription has been clinically reviewed: Yes      Baseline Quality of Life Assessment      How many days over the past month did your condition  keep you from your normal activities? For example, brushing your teeth or getting up in the morning. 0    Financial Information     Medication Assistance provided: None Required    Anticipated copay of $4 reviewed with patient. Verified delivery address.    Delivery Information     Scheduled delivery date: 02/20/22    Expected start date: TBD    Medication will be delivered via Same Day Courier to the prescription address in [facility].  This shipment will not require a signature.      Explained the services we provide at [facility] and that each month we would call to set up refills.  Stressed importance of returning phone calls so that we could ensure they receive their medications in time each month.  Informed patient that we should be setting up refills 7-10 days prior to when they will run out of medication.  A pharmacist will reach out to perform a clinical assessment periodically.  Informed patient that a welcome packet, containing information about our pharmacy and other support services, a Notice of Privacy Practices, and a drug information handout will be sent.      The patient or caregiver noted above participated in the development of this care plan and knows that they can request review of or adjustments to the care plan at any time.      Patient or caregiver verbalized understanding of the above information as well as how to contact the pharmacy at [phone] option 4 with any questions/concerns.  The pharmacy is open Monday through Friday 8:30am-4:30pm.  A pharmacist is available 24/7 via pager to answer any clinical questions they may have.    Patient Specific Needs     - Does the patient have any physical, cognitive, or cultural barriers? No    - Does the patient have adequate living arrangements? (i.e. the ability to store and take their medication appropriately) Yes    - Did you identify any home environmental safety or security hazards? No    - Patient prefers to have medications discussed with Patient     - Is the patient or caregiver able to read and understand education materials at a high school level or above? Yes    - Patient's primary language is  English     - Is the patient high risk? No    SOCIAL DETERMINANTS OF HEALTH     At the [facility], we have learned that life circumstances - like trouble affording food, housing, utilities, or transportation can affect the health of many of our patients.   That is why we wanted to ask: are you currently experiencing any life circumstances that are negatively impacting your health and/or quality of life? No    Social Determinants of Health     Financial Resource Strain: High Risk   ??? Difficulty  of Paying Living Expenses: Hard   Internet Connectivity: Not on file   Food Insecurity: Food Insecurity Present   ??? Worried About Programme researcher, broadcasting/film/video in the Last Year: Sometimes true   ??? Ran Out of Food in the Last Year: Sometimes true   Tobacco Use: Medium Risk   ??? Smoking Tobacco Use: Former   ??? Smokeless Tobacco Use: Never   ??? Passive Exposure: Not on file   Housing/Utilities: Low Risk    ??? Within the past 12 months, have you ever stayed: outside, in a car, in a tent, in an overnight shelter, or temporarily in someone else's home (i.e. couch-surfing)?: No   ??? Are you worried about losing your housing?: No   ??? Within the past 12 months, have you been unable to get utilities (heat, electricity) when it was really needed?: No   Alcohol Use: Not At Risk   ??? How often do you have a drink containing alcohol?: Never   ??? How many drinks containing alcohol do you have on a typical day when you are drinking?: 1 - 2   ??? How often do you have 5 or more drinks on one occasion?: Never   Transportation Needs: No Transportation Needs   ??? Lack of Transportation (Medical): No   ??? Lack of Transportation (Non-Medical): No   Substance Use: Low Risk    ??? Taken prescription drugs for non-medical reasons: Never   ??? Taken illegal drugs: Never   ??? Patient indicated they have taken drugs in the past year for non-medical reasons: Yes, [positive answer(s)]: Not on file   Health Literacy: Medium Risk   ??? : Sometimes   Physical Activity: Not on file   Interpersonal Safety: Not on file   Stress: Not on file   Intimate Partner Violence: Not At Risk   ??? Fear of Current or Ex-Partner: No   ??? Emotionally Abused: No   ??? Physically Abused: No   ??? Sexually Abused: No   Depression: At risk   ??? PHQ-2 Score: 3   Social Connections: Not on file       Would you be willing to receive help with any of the needs that you have identified today? Not applicable       Johney Perotti Vangie Bicker  [facility] Specialty Pharmacist

## 2022-02-20 LAB — REFERRAL LABORATORY TEST, OTHER

## 2022-02-20 NOTE — Unmapped (Signed)
I discussed the patient with the resident, participating in the key portions of the service.  I reviewed the resident???s note.  I agree with the resident???s findings and plan.       Mariane Baumgarten, MD

## 2022-03-02 ENCOUNTER — Ambulatory Visit: Admit: 2022-03-02 | Discharge: 2022-03-03 | Payer: MEDICAID

## 2022-03-02 DIAGNOSIS — M8000XD Age-related osteoporosis with current pathological fracture, unspecified site, subsequent encounter for fracture with routine healing: Principal | ICD-10-CM

## 2022-03-02 LAB — BASIC METABOLIC PANEL
ANION GAP: 6 mmol/L (ref 5–14)
BLOOD UREA NITROGEN: 16 mg/dL (ref 9–23)
BUN / CREAT RATIO: 24
CALCIUM: 9.4 mg/dL (ref 8.7–10.4)
CHLORIDE: 104 mmol/L (ref 98–107)
CO2: 31.8 mmol/L — ABNORMAL HIGH (ref 20.0–31.0)
CREATININE: 0.68 mg/dL
EGFR CKD-EPI (2021) FEMALE: >90 mL/min/{1.73_m2} (ref >=60)
GLUCOSE RANDOM: 89 mg/dL (ref 70–179)
POTASSIUM: 3.6 mmol/L (ref 3.4–4.8)
SODIUM: 142 mmol/L (ref 135–145)

## 2022-03-02 LAB — TSH: THYROID STIMULATING HORMONE: 0.949 u[IU]/mL (ref 0.550–4.780)

## 2022-03-02 MED ORDER — ABALOPARATIDE 80 MCG/DOSE(3,120 MCG/1.56 ML) SUBCUTANEOUS PEN INJECTOR
Freq: Every day | SUBCUTANEOUS | 6 refills | 28 days | Status: CP
Start: 2022-03-02 — End: ?
  Filled 2022-03-23: qty 1.56, 28d supply, fill #0

## 2022-03-02 MED FILL — VENTOLIN HFA 90 MCG/ACTUATION AEROSOL INHALER: RESPIRATORY_TRACT | 33 days supply | Qty: 18 | Fill #3

## 2022-03-02 MED FILL — SYMBICORT 160 MCG-4.5 MCG/ACTUATION HFA AEROSOL INHALER: RESPIRATORY_TRACT | 30 days supply | Qty: 10.2 | Fill #6

## 2022-03-02 NOTE — Unmapped (Signed)
It was a pleasure seeing you today at our [facility].   If any testing was ordered today, most of the time the results will appear in your MyChart account even before I see them. Once all your results are available, I will contact you within 1-2 weeks with an explanation. Please contact me if you do not hear from me by then.   Please make sure you have access to MyChart since this is how I will communicate with you.  If you do not have MyChart, I will send you a letter which may take 1-2 weeks to arrive.      Remember to call us with any urgent issues. Please allow a few days for mychart messages to be read and answered.     If you are seen for diabetes, please bring your glucometer and all devices to every visit.   All refill requests must be submitted 14 days in advance to allow enough time to be processed.

## 2022-03-02 NOTE — Unmapped (Signed)
Reason for follow up: severe osteoporosis.    Referring Provider: Johnsie Ibarra    Primary Care Provider: Johnsie Cancel, MD      Assessment/Plan:       1. Severe Osteoporosis with current pathological fracture, unspecified osteoporosis type, initial encounter  - With previous fragility fracture, R hip fracture 06/2021 s/p THA, fall from standing height (slipped in wet floor).   - Risk factors for osteoporosis identified: Menopause, Sjogren, Hydroxychloroquine  - additional work up for secondary causes of osteoporosis: normal calcium, vitamin d, phosphorus, alk phos, renal function, TSH. No clinical signs of Cushing's. Patient does not have contraindications for PTH analog, no hypercalcemia, renal stones, cancer, bone mets or radiation. Has normal Alk Phos   - started Tymlos 01/2022, tolerating well without skin side effects and normal calcium.   Plan:  - c/w Tymlos 80 mg every day, with goal to complete 2 yrs (around 01/2024), after which will give Reclast 5 mg IV for 2-3 doses depending on how much BMD improvement we obtain. We discussed expected BMD gain at spine ~10% and hip ~3% from abaloparatide.   - monitor vitamin d, calcium and renal function while on therapy (check today)  - continue with vitamin d supplementation to keep goal vitamin d > 30 and calcium intake/supplementation equivalent to 1000-1200 mg daily.  - monitor DXA scan every 2 years (due 12/2023)    All questions were answered and patient agrees with plan.     Return in about 6 months (around 09/01/2022) for [employer], In-person.      Thank you for referring your patient to our endocrine clinic for evaluation. Please do not hesitate to contact me with any questions.     Tawanna Sat, MD  [facility]  Phone [phone]  Fax [phone]    Subjective:       Christy Ibarra is a 66 y.o. female with history of Anxiety/Depression, Esophageal dysphagia, Sjogrens Syndrome, Asthma-COPD, Bronchiectasis, who is seen at the request of Christy Ibarra in here for follow up of severe osteoporosis. Last seen on 12/02/21        Interval hx 03/02/22    Tymlos/Abaloparatide approved, patient received training by nurse on injections. Has been injecting for about 1 months. Denies skin rash. Recent calcium and renal function normal on 02/17/22. Reports baseline joint pain from her underlying rheumatologic condition, unchanged since tymlos started.     She completed therapy since her hip fractures. Daughter has done home modifications to minimize falls. She ambulates with walker at home. No falls since last visit.     Comes with [name] daughter.     Initial encounter 12/02/21    Pertinent Bone health History:  Fracture: R hip fracture 06/2021 s/p THA, fall from standing height (slipped in wet floor)  Fracture in Family members: Sister has Osteoporosis and shoulder fracture (does not know mechanism)  Previous Medication use:  none  Hyperthyroidism: denied  Gonadal Function:  Menarche: About 66 years old  Menopause: About 61s years old.   Vitamin D: Vitamin D 5000 every day   Calcium: No calcium tablets . Almond milk 2 cups a day, yogurt 2 cups every other day.   Kidney Stones: denied  Hypercalcemia: denies  Steroid use: Reports for 5 months once, and occasionally needs steroids once every 2 years or so for short periods of time.   Changes in weight: stable  Smoking: for 25 years, stopped 2019  Alcohol:  denied  Malabsorption: no chronic diarrhea  History  of early loss of primary teeth: No teeth for years, attributed to Sjogren. Started losing teeth in her 59s.   Anemia: denied  Falls in the past month: denied  Exercise:   Vegan/Vegetarian diet: denies  Rheumatoid Arthritis (or other autoimmune conditions): Sjogrens, taking hydroxychloroquine    Has history of cervical cancer s/p hysterectomy in her 40s, reports completely removed, did not need chemo or radiation.     Mobility was not optimal prior to hip fracture, since fracture she has been requiring walker or cane at home, occasionally wheelchair.     Dental work needed?: No teeth, no dentures or implants.     Family History: Denies history of fractures, hypercalcemia or kidney stones in the family. Denies family history of pituitary, hypothalamic, thyroid, parathyroid, adrenal or pancreatic tumors.    Social history:   She lives by herself, husband passed. She is able to do most ADL independently. Daughter does cleaning and laundry.       Past Medical History:   Diagnosis Date    Anxiety     Caregiver stress 06/23/2021    Cataract     COPD (chronic obstructive pulmonary disease) (CMS-HCC)     Depression     Dry eyes     Joint pain     Scoliosis      Allergies   Allergen Reactions    [facility] Dust Cough     Social History     Socioeconomic History    Marital status: Married     Spouse name: Ezequiel Ganser    Number of children: 2    Years of education: 12    Highest education level: High school graduate   Tobacco Use    Smoking status: Former     Packs/day: 2.00     Years: 25.00     Pack years: 50.00     Types: Cigarettes    Smokeless tobacco: Never   Vaping Use    Vaping Use: Never used   Substance and Sexual Activity    Alcohol use: Not Currently    Drug use: Never   Social History Narrative    Date information was obtained:  06/23/21        Born in [place]    Raised with both parents     Parents deceased: Mother died age 28 due to ovarian cancer. Father died age 5 due to a stroke.    Siblings - an older brother and older sister        Relocated to [place]: yes   If relocated, came to [place]: year: 26   Reason:  Marriage to husband        Residential:    Who lives in the home currently?: Patient and her husband    Housing: own housing    Risk of losing current housing:  no    Homelessness: yes - 1979 -1980    Eviction: no         Food Insecurity: No currently since getting food stamps as of June '22        Transportation: car driven by family        Marital/partner status: Married to husband, Ezequiel Ganser for 41 years. Husband is 47 yrs old.    Sexually active: no     Related arguments:  no         Children: (number and ages, living with) - Daughter age 11, [name], and another daughter age 30, Tresa Endo. Also has stepchildren  Grandchildren - granddaughter age 48 and grandson age 35        Education    Through what year did you complete in school: high school graduate    History of Learning Disability or Learning Difficulty: yes - difficulty processing and understanding        Work    Income/Employment/Disability: Futures trader. 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 Electrical engineer: no     Vocational Rehabilitation: no         Financial planner:  Has not served in the [employer] to Firearms: None     Social Determinants of Psychologist, prison and probation services Strain: High Risk    Difficulty of Paying Living Expenses: Hard   Food Insecurity: Geophysicist/field seismologist Present    Worried About Programme researcher, broadcasting/film/video in the Last Year: Sometimes true    Barista in the Last Year: Sometimes true   Transportation Needs: No Transportation Needs    Lack of Transportation (Medical): No    Lack of Transportation (Non-Medical): No      Past Surgical History:   Procedure Laterality Date    HYSTERECTOMY      PR BRONCHOSCOPY,DIAGNOSTIC W LAVAGE Bilateral 09/20/2020    Procedure: BRONCHOSCOPY, RIGID OR FLEXIBLE, INCLUDE FLUOROSCOPIC GUIDANCE WHEN PERFORMED; W/BRONCHIAL ALVEOLAR LAVAGE WITH MODERATE SEDATION;  Surgeon: Sheran Spine, MD;  Location: BRONCH PROCEDURE LAB [facility];  Service: Pulmonary    PR SUBTOT REMV VITREOUS,MECH VIRECTOMY Right 08/26/2020    Procedure: REMOVAL OF VITREOUS, ANTERIOR APPROACH(OPEN SKY TECHNIQUE OR LIMBAL INCISION); SUBTL REMOVAL W/MECH VITRECT;  Surgeon: Jeannene Patella, MD;  Location: [facility];  Service: Ophthalmology    PR UPPER GI ENDOSCOPY,BIOPSY N/A 12/11/2021    Procedure: UGI ENDOSCOPY; WITH BIOPSY, SINGLE OR MULTIPLE;  Surgeon: Marene Lenz, MD;  Location: GI PROCEDURES MEMORIAL [facility];  Service: Gastroenterology    PR XCAPSL CTRC RMVL INSJ IO LENS PROSTH W/O ECP Left 08/12/2020    Procedure: EXTRACAPSULAR CATARACT REMOVAL W/INSERTION OF INTRAOCULAR LENS PROSTHESIS, MANUAL OR MECHANICAL TECHNIQUE WITHOUT ENDOSCOPIC CYCLOPHOTOCOAGULATION;  Surgeon: Jeannene Patella, MD;  Location[facility] OR [facility];  Service: Ophthalmology    PR XCAPSL CTRC RMVL INSJ IO LENS PROSTH W/O ECP Right 08/26/2020    Procedure: EXTRACAPSULAR CATARACT REMOVAL W/INSERTION OF INTRAOCULAR LENS PROSTHESIS, MANUAL OR MECHANICAL TECHNIQUE WITHOUT ENDOSCOPIC CYCLOPHOTOCOAGULATION;  Surgeon: Jeannene Patella, MD;  [facility];  Service: Ophthalmology        Current Outpatient Medications:     albuterol HFA 90 mcg/actuation inhaler, Inhale 1 puff every four (4) hours as needed for wheezing., Disp: 8.5 g, Rfl: 11    budesonide-formoteroL (SYMBICORT) 160-4.5 mcg/actuation inhaler, Inhale 2 puffs Two (2) times a day., Disp: 10.2 g, Rfl: 11    citalopram (CELEXA) 40 MG tablet, Take 1 tablet (40 mg total) by mouth daily., Disp: 90 tablet, Rfl: 2    empty container (SHARPS CONTAINER) Misc, Use as directed, Disp: 1 each, Rfl: 2    hydrOXYchloroQUINE (PLAQUENIL) 200 mg tablet, Take 1 tablet (200 mg total) by mouth daily., Disp: 90 tablet, Rfl: 3    hydrOXYzine (ATARAX) 25 MG tablet, Take 1 tablet (25 mg total) by mouth two (2) times a day as needed., Disp: 60 tablet, Rfl: 2  montelukast (SINGULAIR) 10 mg tablet, Take 1 tablet (10 mg total) by mouth daily., Disp: 90 tablet, Rfl: 3    pen needle, diabetic 31 gauge x 3/16 (5 mm) Ndle, 1 each by Miscellaneous route in the morning., Disp: 100 each, Rfl: 3    pilocarpine (SALAGEN) 5 MG tablet, Take 1 tablet (5 mg total) by mouth Three (3) times a day., Disp: 270 tablet, Rfl: 3    traZODone (DESYREL) 50 MG tablet, Take 0.5 tablets (25 mg total) by mouth nightly., Disp: 90 tablet, Rfl: 2    triamcinolone (KENALOG) 0.1 % cream, Apply topically Two (2) times a day. Apply to back for itching, Disp: 45 g, Rfl: 0    abaloparatide 80 mcg (3,120 mcg/1.56 mL) PnIj, Inject 80 mcg under the skin daily., Disp: 1.56 mL, Rfl: 6    albuterol 2.5 mg /3 mL (0.083 %) nebulizer solution, Inhale 3 mL (2.5 mg total) by nebulization every six (6) hours as needed for wheezing or shortness of breath., Disp: 360 mL, Rfl: 11    ipratropium (ATROVENT) 0.02 % nebulizer solution, Inhale the contents of 1 vial (500 mcg total) by nebulization Four (4) times a day., Disp: 62.5 mL, Rfl: 12      Review of Systems  A 12 point review of systems was otherwise negative except as noted in the HPI.      Objective:      BP 109/63  - Pulse 69  - Ht 170.2 cm (5' 7.01)  - LMP  (LMP Unknown)  - BMI 20.83 kg/m??   Wt Readings from Last 3 Encounters:   02/19/22 60.3 kg (133 lb)   02/17/22 60.3 kg (133 lb)   01/15/22 58.1 kg (128 lb)      BMI Readings from Last 3 Encounters:   03/02/22 20.83 kg/m??   02/19/22 20.83 kg/m??   02/17/22 20.83 kg/m??     GEN: appears well, in NAD  HEENT: sclerae anicteric  NECK:  no visible neck mass or deformity  CHEST: normal breathing chest movements  NEURO: Aox3, following commands. In wheelchair  PSYCH: normal affect.  SKIN: no visible rash    Lab Review: most recent Calcium, Cr, GFR, Alk Phos, Phosphate, PTH and vitamin levels reviewed   Component      Latest Ref Rng & Units 09/24/2020 03/25/2021 11/10/2021   TSH      0.550 - 4.780 uIU/mL  1.434    Vitamin D Total (25OH)      20.0 - 80.0 ng/mL 29.3  25.1   Free T4      0.89 - 1.76 ng/dL        Component      Latest Ref Rng & Units 08/04/2021   Sodium      135 - 145 mmol/L 140   Potassium      3.4 - 4.8 mmol/L 4.1   Chloride      98 - 107 mmol/L 106   CO2      20.0 - 31.0 mmol/L 29.5   Anion Gap      5 - 14 mmol/L 5   Bun      9 - 23 mg/dL 14   Creatinine      5.40 - 0.80 mg/dL 9.81 BUN/Creatinine Ratio       20   eGFR CKD-EPI (2021) Female      >=60 mL/min/1.1m2 >90   Glucose      70 - 99 mg/dL 96   Calcium  8.7 - 10.4 mg/dL 9.7   Albumin      3.4 - 5.0 g/dL 3.8   Total Protein      5.7 - 8.2 g/dL 7.5   Total Bilirubin      0.3 - 1.2 mg/dL 0.5   AST      <=45 U/L 18   ALT      10 - 49 U/L <7 (L)   Alkaline Phosphatase      46 - 116 U/L 93       Radiology: most recent DXA scan reviewed and compared to previous one (if available)     DATE: 09/09/2021 2:34 PM  ACCESSION: [id number]  DICTATED: 09/09/2021 2:37 PM  INTERPRETATION LOCATION: [facility]     CLINICAL INDICATION: 66 years old Female with bone scan for c/f osteoporosis  - S72.001D - Closed fracture of right hip with routine healing, subsequent encounter       COMPARISON: None.     TECHNIQUE: Bone mineral density was assessed using the Horizon A bone densitometer.  The results of the study are expressed in bone mineral density (BMD) and interpreted using World Health Organization [facility]) criteria, per ISCD positions.     FINDINGS     Lumbar Spine       Excluded levels: none.      BMD:   0.595 (g/cm)        T score:  -4.1     Lumbar WHO classification: OSTEOPOROSIS.          Left Hip         Femoral Neck:           BMD:  0.463 (g/cm)           T score:  -3.5        Total Hip           BMD:  0.448 (g/cm)            T score:  -4.1      Hip WHO classification: OSTEOPOROSIS.

## 2022-03-10 LAB — VITAMIN D 25 HYDROXY: VITAMIN D, TOTAL (25OH): 19 ng/mL — ABNORMAL LOW (ref 20.0–80.0)

## 2022-03-11 ENCOUNTER — Institutional Professional Consult (permissible substitution): Admit: 2022-03-11 | Discharge: 2022-03-12 | Payer: MEDICAID | Attending: Clinical | Primary: Clinical

## 2022-03-11 MED ORDER — ERGOCALCIFEROL (VITAMIN D2) 1,250 MCG (50,000 UNIT) CAPSULE
ORAL_CAPSULE | ORAL | 0 refills | 42 days | Status: CP
Start: 2022-03-11 — End: 2022-04-16
  Filled 2022-03-23: qty 6, 42d supply, fill #0

## 2022-03-11 NOTE — Unmapped (Signed)
INTERNAL MEDICINE COUNSELING SESSION NOTE    LENGTH OF SESSION:  Psychotherapy Session 30 minutes    TYPE OF PSYCHOTHERAPY: Behavioral, Supportive    REASON FOR TREATMENT: Problem Solving Treatment and Mind Body Stress Reduction skill training for caregiver stress, mixed anxiety and depression; link impact of effort to mood.               The patient reports they are currently: at home. I spent 30 minutes on the phone with the patient on the date of service. I spent an additional 10 minutes on pre- and post-visit activities on the date of service.    The patient was physically located in [place] or a state in which I am permitted to provide care. The patient and/or parent/guardian understood that s/he may incur co-pays and cost sharing, and agreed to the telemedicine visit. The visit was reasonable and appropriate under the circumstances given the patient's presentation at the time.    The patient and/or parent/guardian has been advised of the potential risks and limitations of this mode of treatment (including, but not limited to, the absence of in-person examination) and has agreed to be treated using telemedicine. The patient's/patient's family's questions regarding telemedicine have been answered.     If the visit was completed in an ambulatory setting, the patient and/or parent/guardian has also been advised to contact their provider???s office for worsening conditions, and seek emergency medical treatment and/or call 911 if the patient deems either necessary.              REVIEW OF FUNCTIONING SINCE LAST VISIT    MOOD (0-10) =    ok  Rating deferred    SATISFACTION WITH EFFORT IN CARING FOR MOOD (0-10) =    Rating deferred    PAIN:     6    At last visit was:       7 - joint pain. Does a little stretching, but is a deep pain     PHQ-9 SCORE:     Rating deferred    GAD-7 SCORE:     Rating deferred      CURRENT SUICIDAL/HOMICIDAL IDEATION:  None      PROBLEM LIST:    Patient Active Problem List Diagnosis    Abnormal gait    Anxiety    Arthralgia of hip    Family history of genetic disorder    Family history of malignant neoplasm of breast    Family history of malignant neoplasm of cervix    Mixed anxiety depressive disorder    Scoliosis    Sjogren's syndrome with keratoconjunctivitis sicca (CMS-HCC)    Leukopenia    Age-related nuclear cataract of both eyes    Poor sleep    Grief    Osteoporosis with current pathological fracture         MEDICATION COMPLIANCE:     Taking all medications regularly?  yes     Any changes in medication since last visit?  yes - start of daily injections for osteoporosis. No side effects    Any missed doses? no       Current Outpatient Medications on File Prior to Visit   Medication Sig Dispense Refill    abaloparatide 80 mcg (3,120 mcg/1.56 mL) PnIj Inject 80 mcg under the skin daily. 1.56 mL 6    albuterol 2.5 mg /3 mL (0.083 %) nebulizer solution Inhale 3 mL (2.5 mg total) by nebulization every six (6) hours as needed for wheezing or shortness of  breath. 360 mL 11    albuterol HFA 90 mcg/actuation inhaler Inhale 1 puff every four (4) hours as needed for wheezing. 8.5 g 11    budesonide-formoteroL (SYMBICORT) 160-4.5 mcg/actuation inhaler Inhale 2 puffs Two (2) times a day. 10.2 g 11    citalopram (CELEXA) 40 MG tablet Take 1 tablet (40 mg total) by mouth daily. 90 tablet 2    empty container (SHARPS CONTAINER) Misc Use as directed 1 each 2    hydrOXYchloroQUINE (PLAQUENIL) 200 mg tablet Take 1 tablet (200 mg total) by mouth daily. 90 tablet 3    hydrOXYzine (ATARAX) 25 MG tablet Take 1 tablet (25 mg total) by mouth two (2) times a day as needed. 60 tablet 2    ipratropium (ATROVENT) 0.02 % nebulizer solution Inhale the contents of 1 vial (500 mcg total) by nebulization Four (4) times a day. 62.5 mL 12    montelukast (SINGULAIR) 10 mg tablet Take 1 tablet (10 mg total) by mouth daily. 90 tablet 3    pen needle, diabetic 31 gauge x 3/16 (5 mm) Ndle 1 each by Miscellaneous route in the morning. 100 each 3    pilocarpine (SALAGEN) 5 MG tablet Take 1 tablet (5 mg total) by mouth Three (3) times a day. 270 tablet 3    [EXPIRED] tiotropium bromide (SPIRIVA RESPIMAT) 2.5 mcg/actuation inhalation mist Inhale 1 puff daily. 4 g 11    traZODone (DESYREL) 50 MG tablet Take 0.5 tablets (25 mg total) by mouth nightly. 90 tablet 2    triamcinolone (KENALOG) 0.1 % cream Apply topically Two (2) times a day. Apply to back for itching 45 g 0     No current facility-administered medications on file prior to visit.           SLEEPING:    not good  Stop phone use by 10pm  Sleep times and length of sleep are all over the place          APPETITE:    pretty good          SUBSTANCE USE:  No        SESSION FOCUS:      Today's Problem Solving addressed:    Reviewed patient's efforts and linked impact of effort to   1. Grief    2. Mixed anxiety depressive disorder    3. Poor sleep        Addressed:   Grief, mixed anxiety and depression, poor sleep     Husband with advanced dementia died in his sleep in the morning on 10/26/21  Patient, and her husband, Doreatha Martin, were together 41 years.  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  Dread time leading up the 4th.  Not wanting to get out of bed.  Worse sleep related to grief around the 4th of each month.  Will dread coming home on the 4th if she's out during the day.  Hx of challenges with anniversaries  Clinician explored her hyper-focus on the 4th of each month, meaning and impact.  Sleep is worse around the 4th of each month.  Clinician helped her identified dread of dread of the 4th  Clinician helped her to consider other options/outlets for grief process.  Sam loved spring, flowers,  and birds/feeding/house  Sister former in patient nurse suggested doing something to honor, Armed forces operational officer Sam  Possibly plant/buy flower, buy and paint a wood birdhouse, get butterfly garden stake  Daughter suggested butterfly release.    Discussed Tresa Endo, younger daughter's distancing, not sharing grief, support.    Discussed issues presented and options for continued and/or improved self care management   Consider options to actively honor Sam and life together - look for/ make/decorate a birdhouse to put out  Plan to reach out to friend help her plan/get things before 5/4    Treatment review  Ms. Bachtell doesn't have transportation to get to [place] group or to counseling and doesn't have video access.  Discussed counseling and a second phone counseling series  Clinician informed her that this service will no longer be covered December 2024. Ms. Laflamme expressed understanding of this.   She would like to do another phone counseling series to help through this first year of grief and then will consider options for other supports.      Additional:     Medication adherence and barriers to the treatment plan have been addressed. Opportunities to optimize healthy behaviors have been discussed. Patient / caregiver voiced understanding.         ASSESSMENT      ENGAGEMENT: Patient presented a willingness to participate in treatment.  PARTICIPATION QUALITY:    Active  MOOD:  OK     AFFECT:  CONGRUENT   MENTAL STATUS:     alert and oriented  MODES OF INTERVENTION used in this session:    Clarification, Exploration, Problem Solving, Support, Reframing, or Week review      TREATMENT PLAN    Short term goal focused counseling for identified treatment goals:   [x]  Reduce PHQ / GAD by 5 points  []  Get or keep PHQ / GAD under 10 points  [x]  Stress and self-care management;   []  Referral for outside treatment  []  Bridge to outside treatment  [x]  Other:   Coping skills, resource information, address anticipatory grief     Patient-stated health goal:   Coping skills to get thru the day when I???m totally frustrated by others and what they say and do. Coping skills to overcome the anxiousness.    Next visit will be    12  of up to 12 visits.     Return PHONE visit Wed 5/3 at 11am  Patient aware this service will not be covered as of December 2024. Informed 03/11/22 by this clinician.

## 2022-03-12 LAB — REFERRAL LABORATORY TEST, OTHER

## 2022-03-16 MED FILL — HYDROXYCHLOROQUINE 200 MG TABLET: ORAL | 90 days supply | Qty: 90 | Fill #0

## 2022-03-16 NOTE — Unmapped (Signed)
[facility] Clinical Assessment & Refill Coordination Note    Christy Ibarra, DOB: February 04, 1956  Phone: [phone] (home)     All above HIPAA information was verified with patient.     Was a Nurse, learning disability used for this call? No    Specialty Medication(s):   General Specialty: Tymlos     Current Outpatient Medications   Medication Sig Dispense Refill   ??? abaloparatide 80 mcg (3,120 mcg/1.56 mL) PnIj Inject 80 mcg under the skin daily. 1.56 mL 6   ??? albuterol 2.5 mg /3 mL (0.083 %) nebulizer solution Inhale 3 mL (2.5 mg total) by nebulization every six (6) hours as needed for wheezing or shortness of breath. 360 mL 11   ??? albuterol HFA 90 mcg/actuation inhaler Inhale 1 puff every four (4) hours as needed for wheezing. 8.5 g 11   ??? budesonide-formoteroL (SYMBICORT) 160-4.5 mcg/actuation inhaler Inhale 2 puffs Two (2) times a day. 10.2 g 11   ??? citalopram (CELEXA) 40 MG tablet Take 1 tablet (40 mg total) by mouth daily. 90 tablet 2   ??? empty container (SHARPS CONTAINER) Misc Use as directed 1 each 2   ??? ergocalciferol-1,250 mcg, 50,000 unit, (DRISDOL) 1,250 mcg (50,000 unit) capsule Take 1 capsule (1,250 mcg total) by mouth once a week for 6 doses. 6 capsule 0   ??? hydrOXYchloroQUINE (PLAQUENIL) 200 mg tablet Take 1 tablet (200 mg total) by mouth daily. 90 tablet 3   ??? hydrOXYzine (ATARAX) 25 MG tablet Take 1 tablet (25 mg total) by mouth two (2) times a day as needed. 60 tablet 2   ??? ipratropium (ATROVENT) 0.02 % nebulizer solution Inhale the contents of 1 vial (500 mcg total) by nebulization Four (4) times a day. 62.5 mL 12   ??? montelukast (SINGULAIR) 10 mg tablet Take 1 tablet (10 mg total) by mouth daily. 90 tablet 3   ??? pen needle, diabetic 31 gauge x 3/16 (5 mm) Ndle Use one new needle each morning for injection 100 each 3   ??? pilocarpine (SALAGEN) 5 MG tablet Take 1 tablet (5 mg total) by mouth Three (3) times a day. 270 tablet 3   ??? traZODone (DESYREL) 50 MG tablet Take 0.5 tablets (25 mg total) by mouth nightly. 90 tablet 2   ??? triamcinolone (KENALOG) 0.1 % cream Apply topically Two (2) times a day. Apply to back for itching 45 g 0     No current facility-administered medications for this visit.        Changes to medications: Merril reports no changes at this time.    Allergies   Allergen Reactions   ??? Center-Al House Dust Cough       Changes to allergies: No    SPECIALTY MEDICATION ADHERENCE     Tymlos 80 mcg: 14 days of medicine on hand       Medication Adherence    Patient reported X missed doses in the last month: 0  Specialty Medication: Tymlos          Specialty medication(s) dose(s) confirmed: Regimen is correct and unchanged.     Are there any concerns with adherence? No    Adherence counseling provided? Not needed    CLINICAL MANAGEMENT AND INTERVENTION      Clinical Benefit Assessment:    Do you feel the medicine is effective or helping your condition? Yes    Clinical Benefit counseling provided? Not needed    Adverse Effects Assessment:    Are you  experiencing any side effects? No    Are you experiencing difficulty administering your medicine? No    Quality of Life Assessment:    Quality of Life    Rheumatology  Oncology  Dermatology  Cystic Fibrosis          How many days over the past month did your osteoporosis  keep you from your normal activities? For example, brushing your teeth or getting up in the morning. Patient declined to answer    Have you discussed this with your provider? Not needed    Acute Infection Status:    Acute infections noted within Epic:  No active infections  Patient reported infection: None    Therapy Appropriateness:    Is therapy appropriate and patient progressing towards therapeutic goals? Yes, therapy is appropriate and should be continued    DISEASE/MEDICATION-SPECIFIC INFORMATION      N/A    PATIENT SPECIFIC NEEDS     - Does the patient have any physical, cognitive, or cultural barriers? No    - Is the patient high risk? No    - Does the patient require a Care Management Plan? No     SOCIAL DETERMINANTS OF HEALTH     At the [facility], we have learned that life circumstances - like trouble affording food, housing, utilities, or transportation can affect the health of many of our patients.   That is why we wanted to ask: are you currently experiencing any life circumstances that are negatively impacting your health and/or quality of life? Patient declined to answer    Social Determinants of Health     Financial Resource Strain: High Risk   ??? Difficulty of Paying Living Expenses: Hard   Internet Connectivity: Not on file   Food Insecurity: Food Insecurity Present   ??? Worried About Programme researcher, broadcasting/film/video in the Last Year: Sometimes true   ??? Ran Out of Food in the Last Year: Sometimes true   Tobacco Use: Medium Risk   ??? Smoking Tobacco Use: Former   ??? Smokeless Tobacco Use: Never   ??? Passive Exposure: Not on file   Housing/Utilities: Low Risk    ??? Within the past 12 months, have you ever stayed: outside, in a car, in a tent, in an overnight shelter, or temporarily in someone else's home (i.e. couch-surfing)?: No   ??? Are you worried about losing your housing?: No   ??? Within the past 12 months, have you been unable to get utilities (heat, electricity) when it was really needed?: No   Alcohol Use: Not At Risk   ??? How often do you have a drink containing alcohol?: Never   ??? How many drinks containing alcohol do you have on a typical day when you are drinking?: 1 - 2   ??? How often do you have 5 or more drinks on one occasion?: Never   Transportation Needs: No Transportation Needs   ??? Lack of Transportation (Medical): No   ??? Lack of Transportation (Non-Medical): No   Substance Use: Low Risk    ??? Taken prescription drugs for non-medical reasons: Never   ??? Taken illegal drugs: Never   ??? Patient indicated they have taken drugs in the past year for non-medical reasons: Yes, [positive answer(s)]: Not on file   Health Literacy: Medium Risk   ??? : Sometimes   Physical Activity: Not on file   Interpersonal Safety: Not on file   Stress: Not on file   Intimate Partner Violence: Not At  Risk   ??? Fear of Current or Ex-Partner: No   ??? Emotionally Abused: No   ??? Physically Abused: No   ??? Sexually Abused: No   Depression: At risk   ??? PHQ-2 Score: 3   Social Connections: Not on file       Would you be willing to receive help with any of the needs that you have identified today? Not applicable       SHIPPING     Specialty Medication(s) to be Shipped:   General Specialty: Tymlos    Other medication(s) to be shipped: No additional medications requested for fill at this time     Changes to insurance: No    Delivery Scheduled: Yes, Expected medication delivery date: 5/1.     Medication will be delivered via Same Day Courier to the confirmed prescription address in [facility].    The patient will receive a drug information handout for each medication shipped and additional FDA Medication Guides as required.  Verified that patient has previously received a Conservation officer, historic buildings and a Surveyor, mining.    The patient or caregiver noted above participated in the development of this care plan and knows that they can request review of or adjustments to the care plan at any time.      All of the patient's questions and concerns have been addressed.    Clydell Hakim   [facility] Specialty Pharmacist

## 2022-03-18 ENCOUNTER — Ambulatory Visit: Admit: 2022-03-18 | Discharge: 2022-03-18 | Payer: MEDICAID

## 2022-03-18 DIAGNOSIS — R768 Other specified abnormal immunological findings in serum: Principal | ICD-10-CM

## 2022-03-18 DIAGNOSIS — J449 Chronic obstructive pulmonary disease, unspecified: Principal | ICD-10-CM

## 2022-03-18 DIAGNOSIS — R1319 Other dysphagia: Principal | ICD-10-CM

## 2022-03-18 DIAGNOSIS — M3501 Sicca syndrome with keratoconjunctivitis: Principal | ICD-10-CM

## 2022-03-18 DIAGNOSIS — Z87891 Personal history of nicotine dependence: Principal | ICD-10-CM

## 2022-03-18 DIAGNOSIS — R269 Unspecified abnormalities of gait and mobility: Principal | ICD-10-CM

## 2022-03-18 DIAGNOSIS — J479 Bronchiectasis, uncomplicated: Principal | ICD-10-CM

## 2022-03-18 DIAGNOSIS — Z7185 Immunization counseling: Principal | ICD-10-CM

## 2022-03-18 MED ORDER — EPINEPHRINE 0.3 MG/0.3 ML INJECTION, AUTO-INJECTOR
Freq: Once | INTRAMUSCULAR | 1 refills | 0 days | Status: CP | PRN
Start: 2022-03-18 — End: ?
  Filled 2022-04-06: qty 2, 2d supply, fill #0

## 2022-03-18 MED ORDER — XOLAIR 150 MG/ML SUBCUTANEOUS SYRINGE
SUBCUTANEOUS | 11 refills | 28 days | Status: CP
Start: 2022-03-18 — End: 2023-03-18
  Filled 2022-04-07: qty 2, 28d supply, fill #0

## 2022-03-18 NOTE — Unmapped (Signed)
Faxed pulm rehab order to virtual rehab [phone].  Fax confirmation received.

## 2022-03-18 NOTE — Unmapped (Signed)
Administered pneumo vax 20 in l deltoid. Pt tolerated inj well.
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
## 2022-03-18 NOTE — Unmapped (Addendum)
It was a pleasure seeing you in clinic today! You were seen in clinic today by Dr. Talmadge Coventry & Dr. Terri Skains    Here's a summary of what we discussed during your visit:  Referral to gastroenterology for manometry & pH probe testing  Sputum tests today  Pulmonary rehab referral today for virtual program  I will start the process to get you xolair or dupixent injectable medication for your asthma copd overlap  Try using Albuterol 2 puffs with spacer before hypertonic nebulized & aerobika twice daily  Use symbicort 2 puffs twice daily with spacer  Labs today - I will follow up with you via MyChart or phone if labs are abnormal.  Keep up the good work with taking your medications, using your inhalers, exercising, and eating healthily      Follow up with me in 3 months!    Christy Ibarra E. Talmadge Coventry, MD  [facility] Pulmonary & Critical Care Medicine      To contact your care team, you can either send a message via MyChart or contact the clinic at [phone].    How do I request medication refills?  Request a refill via [facility] (patient portal), call clinic at [phone] or have your pharmacy fax the request to [phone].    [facility]: [phone] *Pharmacy can mail medications to your home. You must call to request the medication be mailed.[facility]: [phone]  [facility]: [phone]    Here are some things you should know about contacting the [facility] Pulmonary Clinic:  Please be advised Epic now releases test results to MyChart as soon as they are available which means you will see your test results before I do.    The best way to reach your doctor for non-urgent matters is through MyChart. I can usually respond within two to three business day but I do not check messages after hours (evenings, weekends, and holidays) and often have other duties (inpatient hospital work, Producer, television/film/video activities, teaching) that make me unavailable.    - If you have sent a MyChart message to the clinic and have not received a response after three business days, please call our clinic at [phone] to speak to a nurse.     If you have an urgent issue that you feel needs a response the same day, you should also contact your primary care provider or be evaluated at an Urgent Care clinic.    For urgent lung issues after hours, you can call the hospital operator [phone]) and ask for the Pulmonary Fellow On Call. This doctor can provide some guidance and will send a message to your regular lung doctor the next morning.    If there is an emergency, call 911 or go to your closest emergency room.    I don't have a MyChart. Why should I get one?   - It's encrypted, so your information is secure  - It's a quick, easy way to contact the care team, manage appointments, see test results, and more!    How do I sign-up for MyChart?   - Download the MyChart app from the [employer] or [employer] and sign-up in the app  - Sign-up online at [email or web address]

## 2022-03-19 DIAGNOSIS — J449 Chronic obstructive pulmonary disease, unspecified: Principal | ICD-10-CM

## 2022-03-20 ENCOUNTER — Ambulatory Visit: Admit: 2022-03-20 | Discharge: 2022-03-21 | Payer: MEDICAID | Attending: Internal Medicine | Primary: Internal Medicine

## 2022-03-20 DIAGNOSIS — M25561 Pain in right knee: Principal | ICD-10-CM

## 2022-03-20 DIAGNOSIS — M5442 Lumbago with sciatica, left side: Principal | ICD-10-CM

## 2022-03-20 DIAGNOSIS — M5441 Lumbago with sciatica, right side: Principal | ICD-10-CM

## 2022-03-20 DIAGNOSIS — G8929 Other chronic pain: Principal | ICD-10-CM

## 2022-03-20 DIAGNOSIS — R29898 Other symptoms and signs involving the musculoskeletal system: Principal | ICD-10-CM

## 2022-03-20 DIAGNOSIS — R1319 Other dysphagia: Principal | ICD-10-CM

## 2022-03-20 DIAGNOSIS — M25562 Pain in left knee: Principal | ICD-10-CM

## 2022-03-20 MED ORDER — DICLOFENAC 1 % TOPICAL GEL
Freq: Four times a day (QID) | TOPICAL | 0 refills | 13.00000 days | Status: CP
Start: 2022-03-20 — End: 2022-03-20
  Filled 2022-03-23: qty 100, 13d supply, fill #0

## 2022-03-20 MED ORDER — LIDOCAINE 5 % TOPICAL OINTMENT
Freq: Every day | TOPICAL | 0 refills | 0 days | Status: CP
Start: 2022-03-20 — End: 2023-03-20
  Filled 2022-03-24: qty 141.76, 30d supply, fill #0

## 2022-03-20 MED ORDER — OMEPRAZOLE 20 MG CAPSULE,DELAYED RELEASE
ORAL_CAPSULE | Freq: Every day | ORAL | 3 refills | 90.00000 days | Status: CP
Start: 2022-03-20 — End: 2023-03-20
  Filled 2022-03-23: qty 90, 90d supply, fill #0

## 2022-03-20 NOTE — Unmapped (Addendum)
Call [facility] to schedule appointment (let them know that Dr. Deanna Artis with Pulmnology and Dr. Delane Ginger (PCP) both placed referrals) Phone: [phone]       Only take naproxen 250 mg twice daily.   Tylenol 1000 mg every 8 hours.   Lidocaine cream.  Voltaren gel on the knees (and back if helpful    Physical therapy referral placed.   Spine Center referral placed.

## 2022-03-20 NOTE — Unmapped (Signed)
Internal Medicine Clinic Visit    Reason for Visit: Follow up     A/P:    1. Chronic midline low back pain with bilateral sciatica  Spoke with Neurology about MRI lumbar spine after last appointment and they recommended to hold off (less scans better given need to stay still). They are pursuing MRI cervical spine which would evaluate her right hand weakness she is reporting today. They believe her joint pain is MSK related. I do think she would benefit from physical therapy and will also send to spine center for evaluation/options for treatment. I do wonder if knee and back pain related to prior hip fracture late 2022.   - AMB REFERRAL TO PHYSICAL THERAPY; Future  - lidocaine (XYLOCAINE) 5 % ointment; Apply topically daily.  Dispense: 120 g; Refill: 0  - Ambulatory referral to Physical Medicine Rehab; Future  - Decrease naproxen to 250 mg BID (counseled on risks)  - Consider switching Celexa to SNRI in future (of note, did not tolerate Cymbalta in the past)    2. Chronic pain of both knees  Will prioritize PT around back but will need for knees likely in the future. I do not think we are dealing with inflammatory arthritis from Sjogren's.   - diclofenac sodium (VOLTAREN) 1 % gel; Apply 2 g topically four (4) times a day.  Dispense: 100 g; Refill: 0  - XR Knee 1 or 2 Views Bilateral; Future    3. Right hand weakness  Reported today but did not examine for this. MRI cervical spine previously ordered by Neurology.     4. Esophageal dysphagia  Referred to GI by myself and Pulmonology. EGD without explanation for dysphagia.  - omeprazole (PRILOSEC) 20 MG capsule; Take 1 capsule (20 mg total) by mouth daily.  Dispense: 90 capsule; Refill: 3    Return in about 1 month (around 04/19/2022). Back pain. Shingrix. FIT test.      I personally spent 39 minutes face-to-face and non-face-to-face in the care of this patient, which includes all pre, intra, and post visit time on the date of service.  __________________________________________________________    HPI:    66 year old with a history of Sjogren's syndrome, anxiety/depression/PTSD, leukopenia, gait instability, osteoporosis, bronchiectasis and asthma/COPD overlap who presents today for follow up.     MyChart message earlier this week:  I???m reaching out to request help with two types of pain I have  1. Is related to daily joint stiffness and pain in the morning  2. Deep pain in my lower back  The pain is not responding to Lanacane patches, Aleve, Ibuprofen or heating pad  The pain prevents my sleeping leaves me exhausted impacts my mobility and prevents me from doing daily activities at home as well as small amount of socialization I have  I know you are working on it  But I???m truely suffering  Is there something you can add to my treatment plan to maintain some quality of life for me      Pain is mostly in her lower back and bilateral knees. Lower back pain is midline. Pain is constant. Nothing really brings it on. Reports back pain started in January. No preceding falls. No new weakness. Can't really bend over.     Feels that her knees are intermittent swollen when on them too much. No warmth or redness. No catching or locking. Does not squat or go upstairs so unsure if this exacerbates it. No prior injuries. Knee pain has been present since  February.     Also reports weakness in her right hand (notes difficult to open a jar).     Taking naproxen takes naproxen 500 mg every 6 hours. Daughter gave her lidocaine patches which does not help. Taking tylenol occasionally.     __________________________________________________________    Problem List:  Patient Active Problem List   Diagnosis    Abnormal gait    Anxiety    Arthralgia of hip    Family history of genetic disorder    Family history of malignant neoplasm of breast    Family history of malignant neoplasm of cervix    Mixed anxiety depressive disorder    Scoliosis    Sjogren's syndrome with keratoconjunctivitis sicca (CMS-HCC)    Leukopenia    Age-related nuclear cataract of both eyes    Poor sleep    Grief    Osteoporosis with current pathological fracture    Bronchiectasis without complication (CMS-HCC)    Asthma-COPD overlap syndrome (CMS-HCC)    Esophageal dysphagia       Medications:  Reviewed in EPIC  __________________________________________________________    Physical Exam:   Vital Signs:  Vitals:    03/20/22 1322   BP: 110/61   Pulse: 75   Temp: 35.7 ??C (96.2 ??F)   TempSrc: Temporal   SpO2: 96%   Weight: 58.2 kg (128 lb 6.4 oz)   Height: 177.8 cm (5' 10)          Body mass index is 18.42 kg/m??.    Gen: Appears at baseline  Ext: No LE edema  Bilateral knees: No effusions. Tender in bilateral medial joint lines. Crepitus in right knee with flexion. Pain with bilateral passive flexion.   Neuro: Alert and oriented to situation. Strength 5/5 in lower extremities with exception of 4/5 hip flexion on right in setting of pain.

## 2022-03-20 NOTE — Unmapped (Signed)
[facility] at [facility]     Type of visit: face to face    Are you located in [place]? (for virtual visits only) N/A    Reason for visit: Follow up      Screening BP- 110/61    HCDM reviewed and updated in Epic:    We are working to make sure all of our patients??? wishes are updated in Epic and part of that is documenting a Environmental health practitioner for each patient  A Health Care Decision Maker is someone you choose who can make health care decisions for you if you are not able - who would you most want to do this for you????  is already up to date.    HCDM, First AlternateFARIA, CASELLA - Daughter - [phone]    COVID-19 Vaccine Summary  Which COVID-19 Vaccine was administered  Moderna  Type:  Dates Given:  09/26/2021          Immunization History   Administered Date(s) Administered    COVID-19 VAC,BIVALENT(46YR UP)BOOST,MODERNA 09/26/2021    COVID-19 VACCINE,MRNA(MODERNA)(PF) 09/04/2020, 12/06/2020, 03/24/2021    Influenza Vaccine Quad (IIV4 PF) 17mo+ injectable 12/13/2019, 09/24/2020, 09/03/2021    PNEUMOCOCCAL POLYSACCHARIDE 23 06/18/2020    Pneumococcal Conjugate 20-valent 03/18/2022    TdaP 05/27/2021       __________________________________________________________________________________________

## 2022-03-25 ENCOUNTER — Institutional Professional Consult (permissible substitution): Admit: 2022-03-25 | Discharge: 2022-03-26 | Payer: MEDICAID | Attending: Clinical | Primary: Clinical

## 2022-03-25 DIAGNOSIS — F4321 Adjustment disorder with depressed mood: Principal | ICD-10-CM

## 2022-03-25 DIAGNOSIS — F418 Other specified anxiety disorders: Principal | ICD-10-CM

## 2022-03-25 DIAGNOSIS — Z72820 Sleep deprivation: Principal | ICD-10-CM

## 2022-03-25 NOTE — Unmapped (Signed)
INTERNAL MEDICINE COUNSELING SESSION NOTE    LENGTH OF SESSION:  Psychotherapy Session 30 minutes    TYPE OF PSYCHOTHERAPY: Behavioral, Supportive    REASON FOR TREATMENT: Problem Solving Treatment and Mind Body Stress Reduction skill training for caregiver stress, mixed anxiety and depression; link impact of effort to mood.               The patient reports they are currently: at home. I spent 30 minutes on the phone with the patient on the date of service. I spent an additional 10 minutes on pre- and post-visit activities on the date of service.parent/guardian understood that s/he may incur co-pays and cost sharing, and agreed to the telemedicine visit. The visit was reasonable and appropriate under the circumstances given the patient's presentation at the time.    The patient and/or parent/guardian has been advised of the potential risks and limitations of this mode of treatment (including, but not limited to, the absence of in-person examination) and has agreed to be treated using telemedicine. The patient's/patient's family's questions regarding telemedicine have been answered.     If the visit was completed in an ambulatory setting, the patient and/or parent/guardian has also been advised to contact their provider???s office for worsening conditions, and seek emergency medical treatment and/or call 911 if the patient deems either necessary.              REVIEW OF FUNCTIONING SINCE LAST VISIT    MOOD (0-10) =    pretty good  Rating deferred    SATISFACTION WITH EFFORT IN CARING FOR MOOD (0-10) =    Rating deferred    PAIN:     4 - Doctor prescribed voltaren gel which is helping with knee pain.    Discussed benefit of movement to build strength and flexibility. PCP made a referral to PT.    At last visit was:  6    PHQ-9 SCORE:     Rating deferred    GAD-7 SCORE:     Rating deferred      CURRENT SUICIDAL/HOMICIDAL IDEATION:  None      PROBLEM LIST:    Patient Active Problem List   Diagnosis    Abnormal gait Anxiety    Arthralgia of hip    Family history of genetic disorder    Family history of malignant neoplasm of breast    Family history of malignant neoplasm of cervix    Mixed anxiety depressive disorder    Scoliosis    Sjogren's syndrome with keratoconjunctivitis sicca (CMS-HCC)    Leukopenia    Age-related nuclear cataract of both eyes    Poor sleep    Grief    Osteoporosis with current pathological fracture    Bronchiectasis without complication (CMS-HCC)    Asthma-COPD overlap syndrome (CMS-HCC)    Esophageal dysphagia         MEDICATION COMPLIANCE:     Taking all medications regularly?  yes     Any changes in medication since last visit?  yes - lidocaine ointment and voltaren gel       Lowered naproxen  Prescription omeprazole    Any missed doses? no       Current Outpatient Medications on File Prior to Visit   Medication Sig Dispense Refill    abaloparatide 80 mcg (3,120 mcg/1.56 mL) PnIj Inject 80 mcg under the skin daily. 1.56 mL 6    albuterol 2.5 mg /3 mL (0.083 %) nebulizer solution Inhale 3 mL (2.5 mg total) by nebulization  every six (6) hours as needed for wheezing or shortness of breath. 360 mL 11    albuterol HFA 90 mcg/actuation inhaler Inhale 1 puff every four (4) hours as needed for wheezing. 8.5 g 11    budesonide-formoteroL (SYMBICORT) 160-4.5 mcg/actuation inhaler Inhale 2 puffs Two (2) times a day. 10.2 g 11    citalopram (CELEXA) 40 MG tablet Take 1 tablet (40 mg total) by mouth daily. 90 tablet 2    diclofenac sodium (VOLTAREN) 1 % gel Apply 2 g topically four (4) times a day. 100 g 0    empty container (SHARPS CONTAINER) Misc Use as directed 1 each 2    EPINEPHrine (EPIPEN) 0.3 mg/0.3 mL injection Inject 0.3 mL (0.3 mg total) into the muscle once as needed for anaphylaxis for up to 1 dose. 1 each 1    ergocalciferol-1,250 mcg, 50,000 unit, (DRISDOL) 1,250 mcg (50,000 unit) capsule Take 1 capsule (1,250 mcg total) by mouth once a week for 6 doses. 6 capsule 0    hydrOXYchloroQUINE (PLAQUENIL) 200 mg tablet Take 1 tablet (200 mg total) by mouth daily. 90 tablet 3    hydrOXYzine (ATARAX) 25 MG tablet Take 1 tablet (25 mg total) by mouth two (2) times a day as needed. 60 tablet 2    ipratropium (ATROVENT) 0.02 % nebulizer solution Inhale the contents of 1 vial (500 mcg total) by nebulization Four (4) times a day. 62.5 mL 12    lidocaine (XYLOCAINE) 5 % ointment Apply topically daily. 141.76 g 0    montelukast (SINGULAIR) 10 mg tablet Take 1 tablet (10 mg total) by mouth daily. 90 tablet 3    omalizumab (XOLAIR) 150 mg/mL syringe Inject the contents of 2 syringes (300 mg total) under the skin every twenty-eight (28) days. 2 mL 11    omeprazole (PRILOSEC) 20 MG capsule Take 1 capsule (20 mg total) by mouth daily. 90 capsule 3    pen needle, diabetic 31 gauge x 3/16 (5 mm) Ndle Use one new needle each morning for injection 100 each 3    pilocarpine (SALAGEN) 5 MG tablet Take 1 tablet (5 mg total) by mouth Three (3) times a day. 270 tablet 3    [EXPIRED] tiotropium bromide (SPIRIVA RESPIMAT) 2.5 mcg/actuation inhalation mist Inhale 1 puff daily. 4 g 11    traZODone (DESYREL) 50 MG tablet Take 0.5 tablets (25 mg total) by mouth nightly. 90 tablet 2    triamcinolone (KENALOG) 0.1 % cream Apply topically Two (2) times a day. Apply to back for itching 45 g 0     Current Facility-Administered Medications on File Prior to Visit   Medication Dose Route Frequency Provider Last Rate Last Admin    sodium chloride 3 % nebulizer solution 4 mL  4 mL Nebulization Once Ellin Mayhew, MD               SLEEPING:    I need to learn to relax before bed. Hard to settle down and then will toss and turn.          APPETITE:    very good Tendency to eat while watching TV          SUBSTANCE USE:  No        SESSION FOCUS:  Addressed:  1. Grief    2. Mixed anxiety depressive disorder    3. Poor sleep        Discussed efforts since last contact.  She ordered bird house and paints for doing something  to honor Sam on or before the 4th of each month  Discussed with daughter options for low or no cost ways to honor Sam  Setting healthy boundaries with younger daughter and people who are unable to support her through this time            Today's Problem Solving addressed:    Reviewed patient's efforts and linked impact of effort to   1. Grief    2. Mixed anxiety depressive disorder    3. Poor sleep        Addressed:   Grief, mixed anxiety and depression, poor sleep     Husband with advanced dementia died in his sleep in the morning on 10/26/21  Patient, and her husband, Doreatha Martin, were together 41 years.  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  Discussed options and ways to stay in the present moment.  Husband used to sit on the porch and watch nature  She used to do devotional with him most mornings    Tendency to eat while watching TV  Discussed likely comfort eating    Continued to process younger daughter's, Landry Dyke, and some church congregants' challenges in dealing with her grief.      Poor sleep  Psychoeducation regarding benefit of power down to help process thoughts and feelings before bed        Discussed issues presented and options for continued and/or improved self care management   Plan to sit on the porch tomorrow like Sam used to, look at nature and then do a devotional this month; do this or other activities in honor, memory of Sam each month  Make efforts to power down 30-60 min to help process thoughts and feelings before bed  Setting limits with unhelpful/inconsiderate family or friends  Continue good connection with older daughter, [name].          Additional:     Medication adherence and barriers to the treatment plan have been addressed. Opportunities to optimize healthy behaviors have been discussed. Patient / caregiver voiced understanding.                 ASSESSMENT      ENGAGEMENT: Patient presented a willingness to participate in treatment.  PARTICIPATION QUALITY:    Active  MOOD:  PRETTY GOOD     AFFECT:  FULL RANGE and CONGRUENT   MENTAL STATUS:     alert and oriented  MODES OF INTERVENTION used in this session:    Clarification, Education, Exploration, Problem Solving, Support, Reframing, or Week review      TREATMENT PLAN    Ms. Adan doesn't have transportation to get to [place] group or to counseling and doesn't have video access.  Discussed counseling and a second phone counseling series  Clinician informed her that this service will no longer be covered December 2024. Ms. Hobday expressed understanding of this.   She would like to do another phone counseling series to help through this first year of grief and then patient consider options for other supports.    Short term goal focused counseling for identified treatment goals:   [x]  Reduce PHQ / GAD by 5 points  []  Get or keep PHQ / GAD under 10 points  [x]  Stress and  self-care management;   []  Referral for outside treatment  []  Bridge to outside treatment  [x]  Other:   Coping skills, resource information, address anticipatory grief     Patient-stated health goal:   Coping skills to get thru the day when I???m totally frustrated by others and what they say and do. Coping skills to overcome the anxiousness.    Next visit will be   1 of up to 12 visits.     [facility] PHONE visit Wed 5/17 at 1pm

## 2022-03-26 NOTE — Unmapped (Unsigned)
LUMBAR PUNCTURE  PRE PROCEDURE   NURSE SCREEN        Pre-procedural planning     Patient Name: Christy Ibarra  Patient MRN: [id number]    Indications: {JB3 Lumbar Puncture Reason:33662}          Please confirm that medication reconciliation has been done in this phone encounter.   Completed    BMI 18.42      INR normal? NA  PLT normal? NA  Brain CT or MRI reviewed by referring physician? Yes  Exam done by referring physician? Yes  Have any CSF orders been placed by ordering physician? Yes        Antiplatelet Agents: No   Known Bleeding Diathesis: No  DVT Prophylaxis: No  Systemic Anticoagulation: No      Has the patient had lumbar spine surgery in the past, and has fusions or implanted hardware been placed?  No      I went over the following risks and complications: headache, backache, bleeding that could result in paralysis, and infection.

## 2022-03-27 LAB — IGE: TOTAL IGE: 257 [IU]/mL — ABNORMAL HIGH

## 2022-03-27 NOTE — Unmapped (Signed)
Orders placed for LP and labs to replace previous orders from Sept 2022    Annalee Genta, MD  PGY-4  [facility] Department of Neurology

## 2022-03-29 ENCOUNTER — Ambulatory Visit
Admission: EM | Admit: 2022-03-29 | Discharge: 2022-03-29 | Disposition: A | Discharge status: Home / Self Care | Payer: Medicaid Other

## 2022-03-29 ENCOUNTER — Encounter: Payer: Self-pay | Admitting: Emergency Medicine

## 2022-03-29 DIAGNOSIS — B029 Zoster without complications: Secondary | ICD-10-CM | POA: Diagnosis not present

## 2022-03-29 MED ORDER — VALACYCLOVIR HCL 1 G PO TABS: 1000.0000 mg | ORAL_TABLET | Freq: Three times a day (TID) | ORAL | 0 refills | Status: AC

## 2022-03-29 NOTE — ED Provider Notes (Signed)
?[facility] ? ? ? ?CSN: [id number] ?Arrival date & time: 03/29/22  1104 ? ? ?  ? ?History   ?Chief Complaint ?Chief Complaint  ?Patient presents with  ? Rash  ? ? ?HPI ?Shelby Ibarra is a 66 y.o. female.  Accompanied by her daughter, patient presents with painful rash on her left lower back, buttock, posterior thigh x2 days.  She denies fever, chills, sore throat, cough, shortness of breath, vomiting, diarrhea, or other symptoms.  No treatments at home.  Her medical history includes COPD, home O2, anxiety, depression, cervical cancer. ? ?The history is provided by the patient, a relative and medical records.  ? ?Past Medical History:  ?Diagnosis Date  ? Anxiety   ? Asthma   ? Cancer [facility])   ? Cervical cancer ([place]) 1999  ? COPD (chronic obstructive pulmonary disease) ([place])   ? Depression   ? History of home oxygen therapy   ? 2l/ [place]  ? ? ?Patient Active Problem List  ? Diagnosis Date Noted  ? Closed fracture of right hip ([place])   ? Fall   ? Surgery, elective   ? Fever   ? Protein-calorie malnutrition, severe 06/27/2021  ? S/P right hip fracture 06/26/2021  ? ? ?Past Surgical History:  ?Procedure Laterality Date  ? TOTAL HIP ARTHROPLASTY Right 06/27/2021  ? Procedure: TOTAL HIP ARTHROPLASTY ANTERIOR APPROACH;  Surgeon: Lovell Sheehan, MD;  Location: [facility] ORS;  Service: Orthopedics;  Laterality: Right;  ? ? ?OB History   ?No obstetric history on file. ?  ? ? ? ?Home Medications   ? ?Prior to Admission medications   ?Medication Sig Start Date End Date Taking? Authorizing Provider  ?Abaloparatide 3120 MCG/1.56ML SOPN Inject into the skin. 03/02/22  Yes [provider]  ?albuterol (VENTOLIN HFA) 108 (90 Base) MCG/ACT inhaler Inhale 2 puffs into the lungs every 6 (six) hours as needed for wheezing or shortness of breath. 05/09/19  Yes Arta Silence, MD  ?budesonide-formoterol (SYMBICORT) 160-4.5 MCG/ACT inhaler Inhale 2 puffs into the lungs 2 (two) times daily. 03/24/21 03/29/22 Yes [provider]   ?citalopram (CELEXA) 40 MG tablet Take 40 mg by mouth daily. 03/25/21 03/29/22 Yes [provider]  ?diclofenac Sodium (VOLTAREN) 1 % GEL Apply topically. 03/20/22 03/20/23 Yes [provider]  ?EPINEPHrine 0.3 mg/0.3 mL IJ SOAJ injection Inject into the muscle. 03/18/22  Yes [provider]  ?ergocalciferol (VITAMIN D2) 1.25 MG (50000 UT) capsule Take by mouth. 03/11/22 05/04/22 Yes [provider]  ?hydroxychloroquine (PLAQUENIL) 200 MG tablet Take 1 tablet by mouth daily. 09/26/21 09/26/22 Yes [provider]  ?hydrOXYzine (ATARAX) 25 MG tablet Take by mouth. 02/17/22 02/17/23 Yes [provider]  ?lidocaine (XYLOCAINE) 5 % ointment Apply topically daily. 03/20/22 03/20/23 Yes [provider]  ?montelukast (SINGULAIR) 10 MG tablet Take 1 tablet by mouth daily. 09/03/21 09/03/22 Yes [provider]  ?omalizumab Arvid Right) 150 MG/ML prefilled syringe Inject into the skin. 03/18/22 03/18/23 Yes [provider]  ?omeprazole (PRILOSEC) 20 MG capsule Take 1 capsule by mouth daily. 03/20/22 03/20/23 Yes [provider]  ?pilocarpine (SALAGEN) 5 MG tablet Take by mouth. 09/26/21 09/26/22 Yes [provider]  ?sodium chloride HYPERTONIC 3 % nebulizer solution Inhale into the lungs. 03/18/22  Yes [provider]  ?Tiotropium Bromide Monohydrate 2.5 MCG/ACT AERS Inhale 1 puff into the lungs daily at 12 noon. 01/13/21 03/29/22 Yes [provider]  ?traZODone (DESYREL) 50 MG tablet Take by mouth. 02/17/22  Yes [provider]  ?valACYclovir (VALTREX) 1000 MG tablet Take 1 tablet (1,000 mg total) by mouth 3 (three) times daily. 03/29/22  Yes Sharion Balloon, NP  ? ? ?Family History ?Family History  ?Problem Relation Age of Onset  ? Breast cancer Mother 39  ?     Deceased  ? Ovarian cancer Mother 69  ? Breast cancer Maternal Grandmother   ?     approximately 50's  ? ? ?Social History ?Social History  ? ?Tobacco Use  ? Smoking status:  Former  ?  Packs/day: 2.00  ?  Years: 25.00  ?  Pack years: 50.00  ?  Types: Cigarettes  ?  Quit date: 10/22/2009  ?  Years since quitting: 12.4  ? Smokeless tobacco: Never  ?Vaping Use  ? Vaping Use: Never used  ?Substance Use Topics  ? Alcohol use: No  ?  Alcohol/week: 0.0 standard drinks  ? Drug use: No  ? ? ? ?Allergies   ?Patient has no known allergies. ? ? ?Review of Systems ?Review of Systems  ?Constitutional:  Negative for chills and fever.  ?HENT:  Negative for ear pain and sore throat.   ?Respiratory:  Negative for cough and shortness of breath.   ?Cardiovascular:  Negative for chest pain and palpitations.  ?Gastrointestinal:  Negative for diarrhea and vomiting.  ?Skin:  Positive for rash.  ?All other systems reviewed and are negative. ? ? ?Physical Exam ?Triage Vital Signs ?ED Triage Vitals  ?Enc Vitals Group  ?   BP 03/29/22 1114 (!) 91/53  ?   Pulse Rate 03/29/22 1114 (!) 55  ?   Resp 03/29/22 1114 18  ?   Temp 03/29/22 1114 97.9 ?F (36.6 ?C)  ?   Temp src --   ?   SpO2 03/29/22 1114 98 %  ?   Weight --   ?   Height --   ?   Head Circumference --   ?   Peak Flow --   ?   Pain Score 03/29/22 1140 8  ?   Pain Loc --   ?   Pain Edu? --   ?   Excl. in [place]? --   ? ?No data found. ? ?Updated Vital Signs ?BP (!) 145/73   Pulse 89   Temp 98.6 ?F (37 ?C)   Resp 18   LMP 10/22/1998   SpO2 95%  ? ?Visual Acuity ?Right Eye Distance:   ?Left Eye Distance:   ?Bilateral Distance:   ? ?Right Eye Near:   ?Left Eye Near:    ?Bilateral Near:    ? ?Physical Exam ?Vitals and nursing note reviewed.  ?Constitutional:   ?   General: She is not in acute distress. ?   Appearance: Normal appearance. She is well-developed. She is not ill-appearing.  ?HENT:  ?   Mouth/Throat:  ?   Mouth: Mucous membranes are moist.  ?Cardiovascular:  ?   Rate and Rhythm: Normal rate and regular rhythm.  ?   Heart sounds: Normal heart sounds.  ?Pulmonary:  ?   Effort: Pulmonary effort is normal. No respiratory distress.  ?   Breath sounds:  Normal breath sounds.  ?Musculoskeletal:  ?   Cervical back: Neck supple.  ?Skin: ?   General: Skin is warm and dry.  ?   Capillary Refill: Capillary refill takes less than 2 seconds.  ?   Findings: Rash present.  ?   Comments: Erythematous papular and vesicular clustered rash on left lower back, left buttock, left  posterior thigh.  ?Neurological:  ?   Mental Status: She is alert.  ?Psychiatric:     ?   Mood and Affect: Mood normal.     ?   Behavior: Behavior normal.  ? ? ? ?UC Treatments / Results  ?Labs ?(all labs ordered are listed, but only abnormal results are displayed) ?Labs Reviewed - No data to display ? ?EKG ? ? ?Radiology ?No results found. ? ?Procedures ?Procedures (including critical care time) ? ?Medications Ordered in UC ?Medications - No data to display ? ?Initial Impression / Assessment and Plan / UC Course  ?I have reviewed the triage vital signs and the nursing notes. ? ?Pertinent labs & imaging results that were available during my care of the patient were reviewed by me and considered in my medical decision making (see chart for details). ? ?Shingles.  Treating with valacyclovir.  Education provided on shingles.  Instructed patient to follow-up with her PCP if her symptoms are not improving.  She agrees to plan of care. ? ? ?Final Clinical Impressions(s) / UC Diagnoses  ? ?Final diagnoses:  ?Herpes zoster without complication  ? ? ? ?Discharge Instructions   ? ?  ?Take the valacyclovir as directed.  See the attached information on shingles.  Follow up with your primary care provider if your symptoms are not improving.   ? ? ? ? ? ?ED Prescriptions   ? ? Medication Sig Dispense Auth. Provider  ? valACYclovir (VALTREX) 1000 MG tablet Take 1 tablet (1,000 mg total) by mouth 3 (three) times daily. 21 tablet Sharion Balloon, NP  ? ?  ? ?PDMP not reviewed this encounter. ?  ?Sharion Balloon, NP ?03/29/22 1223 ? ?

## 2022-03-29 NOTE — Discharge Instructions (Addendum)
Take the valacyclovir as directed.  See the attached information on shingles.  Follow up with your primary care provider if your symptoms are not improving.   ? ?

## 2022-03-29 NOTE — ED Triage Notes (Signed)
Pt presents with a rash on her lower back and thigh x 2 days  ?

## 2022-03-30 NOTE — Unmapped (Signed)
[facility] Specialty Medication Onboarding    Specialty Medication: Xolair  Prior Authorization: Approved   Financial Assistance: No - copay  <$25  Final Copay/Day Supply: $4 / 28    Insurance Restrictions: None     Notes to Pharmacist: Epipen $4    The triage team has completed the benefits investigation and has determined that the patient is able to fill this medication at [facility]. Please contact the patient to complete the onboarding or follow up with the prescribing physician as needed.

## 2022-03-31 DIAGNOSIS — J449 Chronic obstructive pulmonary disease, unspecified: Principal | ICD-10-CM

## 2022-03-31 MED ORDER — BUDESONIDE-FORMOTEROL HFA 160 MCG-4.5 MCG/ACTUATION AEROSOL INHALER
Freq: Two times a day (BID) | RESPIRATORY_TRACT | 11 refills | 31 days | Status: CP
Start: 2022-03-31 — End: 2023-03-31

## 2022-03-31 NOTE — Unmapped (Signed)
Pharmacy requesting refill.  Last appointment on 03/18/2022.  Next appointment scheduled for 06/17/2022.  Requested Prescriptions     Pending Prescriptions Disp Refills    budesonide-formoteroL (SYMBICORT) 160-4.5 mcg/actuation inhaler 10.2 g 11     Sig: Inhale 2 puffs Two (2) times a day.     RX routed to Dr. Talmadge Coventry for review.

## 2022-03-31 NOTE — Unmapped (Signed)
This patient is receiving Xolair as a clinic administered medication. Pharmacist reviewed the prescription and the patient's chart and determined that therapy is appropriate.  Patient is aware of copay of $4. The patient prefers all future communication to occur with the clinic. This patient is being disenrolled from our specialty management program and will be added to a patient list for appropriate follow up.

## 2022-03-31 NOTE — Unmapped (Signed)
[facility]   Patient Onboarding/Medication Counseling    Christy Ibarra is a 66 y.o. 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  I am speaking to the patient.    Was a Nurse, learning disability used for this call? No    Verified patient's date of birth / HIPAA.    Specialty medication(s) to be sent: CF/Pulmonary/Asthma: Xolair      Non-specialty medications/supplies to be sent: Epipen, citalopram 40mg , and symbicort 160-4.5 mcg/act      Medications not needed at this time: hydroxyzine 25mg , trazodone 50mg  and ventolin HFA Inhaler       The patient declined counseling on medication administration because they will receive counseling in clinic. The information in the declined sections below are for informational purposes only and was not discussed with patient.     Xolair Syringes (omalizumab)    Medication & Administration     Dosage: Inject 300 mg under the skin every 28 days    Administration:     Prefilled Syringe:  1. Gather all supplies needed for injection on a clean, flat working surface: medication syringe(s) removed from packaging, alcohol swab, sharps container, etc.  2. Look at the medication label - look for correct medication, correct dose, and check the expiration date  3. Look at the medication - the liquid in the syringe should appear clear and colorless to slightly yellow, you may see a few white particles  4. Lay the syringe on a flat surface and allow it to warm up to room temperature for at least 15-30 minutes  5. Select injection site - you can use the front of your thigh or your belly (but not the area 2 inches around your belly button); if someone else is giving you the injection you can also use your upper arm in the skin covering your triceps muscle or in the buttocks  6. Prepare injection site - wash your hands and clean the skin at the injection site with an alcohol swab and let it air dry, do not touch the injection site again before the injection  7. Pull off the needle safety cap, do not remove until immediately prior to injection  8. Pinch the skin - with your hand not holding the syringe pinch up a fold of skin at the injection site using your forefinger and thumb  9. Insert the needle into the fold of skin at about a 45 degree angle - it's best to use a quick dart-like motion  10. Push the plunger down slowly as far as it will go until the syringe is empty, if the plunger is not fully depressed the needle shield will not extend to cover the needle when it is removed, hold the syringe in place for a full 5 seconds  11. Check that the syringe is empty and keep pressing down on the plunger while you pull the needle out at the same angle as inserted; after the needle is removed completely from the skin, release the plunger allowing the needle shield to activate and cover the used needle  12. Dispose of the used syringe immediately in your sharps disposal container, do not attempt to recap the needle prior to disposing  If you see any blood at the injection site, press a cotton ball or gauze on the site and maintain pressure until the bleeding stops, do not rub the injection site     Adherence/Missed dose instructions: If you miss a dose take as soon  as you remember.  Resume the correct dosing schedule.        Goals of Therapy     ??? To treat asthma and or chronic idiopathic urticaria    Side Effects & Monitoring Parameters     Commonly reported side effects  ??? Headache  ??? Nausea, vomiting   ??? Injection site reaction  ??? Loss of strength and energy  ??? Common cold symptoms, sore throat, stuffy nose  ??? Ear pain  ??? Painful extremities     The following side effects should be reported to the provider:  ??? Signs of cerebrovascular disease (change in strength on one side is greater than the other, trouble speaking or thinking, change in balance or vision changes)  ??? Signs of DVT (swelling, warmth, numbness, change in color or pain in extremities)  ??? Signs of anaphylaxis (wheezing, chest tightness, swelling of face, lips, tongue or throat)    Monitoring Parameters:   ??? Anaphylactic/hypersensitivity reactions (observe patients for 2 hours after the first 3 injections and 30 minutes after subsequent injections or in accordance with individual institution policies and procedures);   ??? Baseline serum total IgE; FEV1, peak flow, and/or other pulmonary function tests  ??? Monitor for signs of infection    Contraindications, Warnings, & Precautions   [place] Boxed Warning]: Anaphylaxis, including delayed-onset anaphylaxis, has been reported following administration; anaphylaxis may present as bronchospasm, hypotension, syncope, urticaria, and/or angioedema of the throat or tongue. Anaphylaxis has occurred after the first dose and in some cases >1 year after initiation of regular treatment. Due to the risk, patients should be observed closely for an appropriate time period after administration and should receive treatment only under direct medical supervision. Healthcare providers should be prepared to administer appropriate therapy for managing potentially life-threatening anaphylaxis. Patients should be instructed on identifying signs/symptoms of anaphylaxis and to seek immediate care if they arise.      Contraindications  ??? Severe hypersensitivity reaction to omalizumab or any component of the formulation    Warnings & Precautions  ??? Cardiovascular effects: Cerebrovascular events, including transient ischemic attack and ischemic stroke, have been reported.  ??? Eosinophilia and vasculitis: In rare cases, patients may present with systemic eosinophilia, sometimes presenting with clinical features of vasculitis.    ??? Fever/arthralgia/rash: Reports of a constellation of symptoms including fever, arthritis or arthralgia, rash, and lymphadenopathy have been reported with post-marketing use.  ??? Malignant neoplasms: Have been reported rarely with use in short-term studies; impact of long-term use is not known.  ??? Parasitic infections: Use with caution and monitor patients at high risk for parasitic infections; risk of infection may be increased; appropriate duration of continued monitoring following therapy discontinuation has not been established.  ??? Corticosteroid therapy: Gradually taper systemic or inhaled corticosteroid therapy; do not discontinue corticosteroids abruptly following initiation of omalizumab therapy. The combined use of omalizumab and corticosteroids in patients with chronic idiopathic urticaria has not been evaluated.  ??? Latex: Prefilled syringe: The needle cap may contain natural rubber latex.  ??? Appropriate use: Therapy has not been shown to alleviate acute asthma exacerbations; do not use to treat acute bronchospasm, status asthmaticus, or other allergic conditions. Do not use to treat forms of urticaria other than chronic idiopathic urticaria.  ??? Dosing/IgE levels: Dosing for asthma is based on body weight and pretreatment total IgE serum levels. IgE levels remain elevated up to 1 year following treatment; therefore, levels taken during treatment or for up to 1 year following treatment cannot and should not  be used as a dosage guide. Dosing in chronic idiopathic urticaria is not dependent on serum IgE (free or total) level or body weight.  ??? Pregnancy Considerations: Omalizumab is a humanized monoclonal antibody (IgG1). Potential placental transfer of human IgG is dependent upon the IgG subclass and gestational age, generally increasing as pregnancy progresses.  ??? Breastfeeding Considerations: It is not known if omalizumab is present in breast milk; however, IgG is excreted in human milk.  Based on information from the pregnancy exposure registry, an increased risk of adverse events was not observed in breastfed infants of mothers using omalizumab. According to the manufacturer, the decision to breastfeed during therapy should consider the risk of infant exposure, the benefits of breastfeeding to the infant, and benefits of treatment to the mother      Drug/Food Interactions     ??? Medication list reviewed in Epic. The patient was instructed to inform the care team before taking any new medications or supplements. No drug interactions identified.     Storage, Handling Precautions, & Disposal     ??? Store this medication in the refrigerator, 2??C to 8??C (36??F to 46??F). in the original carton.  Protect from direct sunlight and do not freeze. Must be used within 4 hours after removal from refrigerator.  ??? Do not shake.  ??? Dispose of used syringes in a sharps disposal container.      Current Medications (including OTC/herbals), Comorbidities and Allergies     Current Outpatient Medications   Medication Sig Dispense Refill   ??? abaloparatide 80 mcg (3,120 mcg/1.56 mL) PnIj Inject 80 mcg under the skin daily. 1.56 mL 6   ??? albuterol 2.5 mg /3 mL (0.083 %) nebulizer solution Inhale 3 mL (2.5 mg total) by nebulization every six (6) hours as needed for wheezing or shortness of breath. 360 mL 11   ??? albuterol HFA 90 mcg/actuation inhaler Inhale 1 puff every four (4) hours as needed for wheezing. 8.5 g 11   ??? budesonide-formoteroL (SYMBICORT) 160-4.5 mcg/actuation inhaler Inhale 2 puffs Two (2) times a day. 10.2 g 11   ??? citalopram (CELEXA) 40 MG tablet Take 1 tablet (40 mg total) by mouth daily. 90 tablet 2   ??? diclofenac sodium (VOLTAREN) 1 % gel Apply 2 g topically four (4) times a day. 100 g 0   ??? empty container (SHARPS CONTAINER) Misc Use as directed 1 each 2   ??? EPINEPHrine (EPIPEN) 0.3 mg/0.3 mL injection Inject 0.3 mL (0.3 mg total) into the muscle once as needed for anaphylaxis for up to 1 dose. 1 each 1   ??? ergocalciferol-1,250 mcg, 50,000 unit, (DRISDOL) 1,250 mcg (50,000 unit) capsule Take 1 capsule (1,250 mcg total) by mouth once a week for 6 doses. 6 capsule 0   ??? hydrOXYchloroQUINE (PLAQUENIL) 200 mg tablet Take 1 tablet (200 mg total) by mouth daily. 90 tablet 3   ??? hydrOXYzine (ATARAX) 25 MG tablet Take 1 tablet (25 mg total) by mouth two (2) times a day as needed. 60 tablet 2   ??? ipratropium (ATROVENT) 0.02 % nebulizer solution Inhale the contents of 1 vial (500 mcg total) by nebulization Four (4) times a day. 62.5 mL 12   ??? lidocaine (XYLOCAINE) 5 % ointment Apply topically daily. 141.76 g 0   ??? montelukast (SINGULAIR) 10 mg tablet Take 1 tablet (10 mg total) by mouth daily. 90 tablet 3   ??? omalizumab (XOLAIR) 150 mg/mL syringe Inject the contents of 2 syringes (300 mg total) under the skin  every twenty-eight (28) days. 2 mL 11   ??? omeprazole (PRILOSEC) 20 MG capsule Take 1 capsule (20 mg total) by mouth daily. 90 capsule 3   ??? pen needle, diabetic 31 gauge x 3/16 (5 mm) Ndle Use one new needle each morning for injection 100 each 3   ??? pilocarpine (SALAGEN) 5 MG tablet Take 1 tablet (5 mg total) by mouth Three (3) times a day. 270 tablet 3   ??? traZODone (DESYREL) 50 MG tablet Take 0.5 tablets (25 mg total) by mouth nightly. 90 tablet 2   ??? triamcinolone (KENALOG) 0.1 % cream Apply topically Two (2) times a day. Apply to back for itching (Patient not taking: Reported on 03/26/2022) 45 g 0     Current Facility-Administered Medications   Medication Dose Route Frequency Provider Last Rate Last Admin   ??? sodium chloride 3 % nebulizer solution 4 mL  4 mL Nebulization Once Ellin Mayhew, MD           Allergies   Allergen Reactions   ??? [facility] Dust Cough       Patient Active Problem List   Diagnosis   ??? Abnormal gait   ??? Anxiety   ??? Arthralgia of hip   ??? Family history of genetic disorder   ??? Family history of malignant neoplasm of breast   ??? Family history of malignant neoplasm of cervix   ??? Mixed anxiety depressive disorder   ??? Scoliosis   ??? Sjogren's syndrome with keratoconjunctivitis sicca (CMS-HCC)   ??? Leukopenia   ??? Age-related nuclear cataract of both eyes   ??? Poor sleep   ??? Grief   ??? Osteoporosis with current pathological fracture   ??? Bronchiectasis without complication (CMS-HCC)   ??? Asthma-COPD overlap syndrome (CMS-HCC)   ??? Esophageal dysphagia       Reviewed and up to date in Epic.    Appropriateness of Therapy     Acute infections noted within Epic:  No active infections  Patient reported infection: Shingles- patient reported to provider    Is medication and dose appropriate based on diagnosis and infection status? Yes    Prescription has been clinically reviewed: Yes      Baseline Quality of Life Assessment      How many days over the past month did your asthma-COPD overlap  keep you from your normal activities? For example, brushing your teeth or getting up in the morning. Christy Ibarra reported experiencing daily, persistent symptoms w/ cough, wheeze & worsened allergy sx despite good regimen at 4/26 clinic visit    Financial Information     Medication Assistance provided: Prior Authorization    Anticipated copay of $4 / 28 days reviewed with patient. Verified delivery address.    Delivery Information     Scheduled delivery date: 04/08/22    Expected start date: TBD - pt will need to schedule an appt to receive first dose in clinic    Medication will be delivered via [facility]  cl[facility] to the temporary address in [place].  This shipment will not require a signature.      Explained the services we provide at [facility] and that each month we would call to set up refills.  Stressed importance of returning phone calls so that we could ensure they receive their medications in time each month.  Informed patient that we should be setting up refills 7-10 days prior to when they will run out of  medication.  A pharmacist will reach out to perform a clinical assessment periodically.  Informed patient that a welcome packet, containing information about our pharmacy and other support services, a Notice of Privacy Practices, and a drug information handout will be sent.      The patient or caregiver noted above participated in the development of this care plan and knows that they can request review of or adjustments to the care plan at any time.      Patient or caregiver verbalized understanding of the above information as well as how to contact the pharmacy at [phone] option 4 with any questions/concerns.  The pharmacy is open Monday through Friday 8:30am-4:30pm.  A pharmacist is available 24/7 via pager to answer any clinical questions they may have.    Patient Specific Needs     - Does the patient have any physical, cognitive, or cultural barriers? No    - Does the patient have adequate living arrangements? (i.e. the ability to store and take their medication appropriately) Yes    - Did you identify any home environmental safety or security hazards? No    - Patient prefers to have medications discussed with  Patient     - Is the patient or caregiver able to read and understand education materials at a high school level or above? Yes    - Patient's primary language is  English     - Is the patient high risk? No    SOCIAL DETERMINANTS OF HEALTH     At the [facility], we have learned that life circumstances - like trouble affording food, housing, utilities, or transportation can affect the health of many of our patients.   That is why we wanted to ask: are you currently experiencing any life circumstances that are negatively impacting your health and/or quality of life? Patient declined to answer    Social Determinants of Health     Financial Resource Strain: High Risk   ??? Difficulty of Paying Living Expenses: Hard   Internet Connectivity: Not on file   Food Insecurity: Food Insecurity Present   ??? Worried About Programme researcher, broadcasting/film/video in the Last Year: Sometimes true   ??? Ran Out of Food in the Last Year: Sometimes true   Tobacco Use: Medium Risk   ??? Smoking Tobacco Use: Former   ??? Smokeless Tobacco Use: Never   ??? Passive Exposure: Not on file   Housing/Utilities: Low Risk    ??? Within the past 12 months, have you ever stayed: outside, in a car, in a tent, in an overnight shelter, or temporarily in someone else's home (i.e. couch-surfing)?: No   ??? Are you worried about losing your housing?: No   ??? Within the past 12 months, have you been unable to get utilities (heat, electricity) when it was really needed?: No   Alcohol Use: Not At Risk   ??? How often do you have a drink containing alcohol?: Never   ??? How many drinks containing alcohol do you have on a typical day when you are drinking?: 1 - 2   ??? How often do you have 5 or more drinks on one occasion?: Never   Transportation Needs: No Transportation Needs   ??? Lack of Transportation (Medical): No   ??? Lack of Transportation (Non-Medical): No   Substance Use: Low Risk    ??? Taken prescription drugs for non-medical reasons: Never   ??? Taken illegal drugs: Never   ??? Patient indicated they have taken  drugs in the past year for non-medical reasons: Yes, [positive answer(s)]: Not on file   Health Literacy: Medium Risk   ??? : Sometimes   Physical Activity: Not on file   Interpersonal Safety: Not on file   Stress: Not on file   Intimate Partner Violence: Not At Risk   ??? Fear of Current or Ex-Partner: No   ??? Emotionally Abused: No   ??? Physically Abused: No   ??? Sexually Abused: No   Depression: At risk   ??? PHQ-2 Score: 3   Social Connections: Not on file       Would you be willing to receive help with any of the needs that you have identified today? Not applicable       Oliva Bustard  [facility] Specialty Pharmacist

## 2022-04-06 MED FILL — SYMBICORT 160 MCG-4.5 MCG/ACTUATION HFA AEROSOL INHALER: RESPIRATORY_TRACT | 30 days supply | Qty: 10.2 | Fill #0

## 2022-04-08 ENCOUNTER — Institutional Professional Consult (permissible substitution): Admit: 2022-04-08 | Discharge: 2022-04-09 | Payer: MEDICAID | Attending: Clinical | Primary: Clinical

## 2022-04-08 DIAGNOSIS — F418 Other specified anxiety disorders: Principal | ICD-10-CM

## 2022-04-08 DIAGNOSIS — F4321 Adjustment disorder with depressed mood: Principal | ICD-10-CM

## 2022-04-08 DIAGNOSIS — Z72820 Sleep deprivation: Principal | ICD-10-CM

## 2022-04-08 NOTE — Unmapped (Signed)
INTERNAL MEDICINE COUNSELING SESSION NOTE    LENGTH OF SESSION:  Psychotherapy Session 30 minutes    TYPE OF PSYCHOTHERAPY: Behavioral, Supportive    REASON FOR TREATMENT: Problem Solving Treatment and Mind Body Stress Reduction skill training for caregiver stress, mixed anxiety and depression; link impact of effort to mood.               The patient reports they are currently: at home. I spent 30 minutes on the phone with the patient on the date of service. I spent an additional 10 minutes on pre- and post-visit activities on the date of service    The patient was physically located in [place] or a state in which I am permitted to provide care. The patient and/or parent/guardian understood that s/he may incur co-pays and cost sharing, and agreed to the telemedicine visit. The visit was reasonable and appropriate under the circumstances given the patient's presentation at the time.    The patient and/or parent/guardian has been advised of the potential risks and limitations of this mode of treatment (including, but not limited to, the absence of in-person examination) and has agreed to be treated using telemedicine. The patient's/patient's family's questions regarding telemedicine have been answered.     If the visit was completed in an ambulatory setting, the patient and/or parent/guardian has also been advised to contact their provider???s office for worsening conditions, and seek emergency medical treatment and/or call 911 if the patient deems either necessary.              REVIEW OF FUNCTIONING SINCE LAST VISIT    MOOD (0-10) =    hurting related to shingles diagnosis 03/29/22, 2nd occurrence  Rating deferred    SATISFACTION WITH EFFORT IN CARING FOR MOOD (0-10) =    Rating deferred    PAIN:     8   At last visit was:  4    PHQ-9 SCORE:     Rating deferred    GAD-7 SCORE:     Rating deferred      CURRENT SUICIDAL/HOMICIDAL IDEATION:  None      PROBLEM LIST:    Patient Active Problem List   Diagnosis Abnormal gait    Anxiety    Arthralgia of hip    Family history of genetic disorder    Family history of malignant neoplasm of breast    Family history of malignant neoplasm of cervix    Mixed anxiety depressive disorder    Scoliosis    Sjogren's syndrome with keratoconjunctivitis sicca (CMS-HCC)    Leukopenia    Age-related nuclear cataract of both eyes    Poor sleep    Grief    Osteoporosis with current pathological fracture    Bronchiectasis without complication (CMS-HCC)    Asthma-COPD overlap syndrome (CMS-HCC)    Esophageal dysphagia         MEDICATION COMPLIANCE:     Taking all medications regularly?  yes     Any changes in medication since last visit?  yes - treatment for shingles    Any missed doses? no       Current Outpatient Medications on File Prior to Visit   Medication Sig Dispense Refill    abaloparatide 80 mcg (3,120 mcg/1.56 mL) PnIj Inject 80 mcg under the skin daily. 1.56 mL 6    albuterol 2.5 mg /3 mL (0.083 %) nebulizer solution Inhale 3 mL (2.5 mg total) by nebulization every six (6) hours as needed for wheezing or shortness of  breath. 360 mL 11    albuterol HFA 90 mcg/actuation inhaler Inhale 1 puff every four (4) hours as needed for wheezing. 8.5 g 11    budesonide-formoteroL (SYMBICORT) 160-4.5 mcg/actuation inhaler Inhale 2 puffs Two (2) times a day. 10.2 g 11    citalopram (CELEXA) 40 MG tablet Take 1 tablet (40 mg total) by mouth daily. 90 tablet 2    diclofenac sodium (VOLTAREN) 1 % gel Apply 2 g topically four (4) times a day. 100 g 0    empty container (SHARPS CONTAINER) Misc Use as directed 1 each 2    EPINEPHrine (EPIPEN) 0.3 mg/0.3 mL injection Inject 0.3 mL (0.3 mg total) into the muscle once as needed for anaphylaxis for up to 1 dose. 2 each 1    ergocalciferol-1,250 mcg, 50,000 unit, (DRISDOL) 1,250 mcg (50,000 unit) capsule Take 1 capsule (1,250 mcg total) by mouth once a week for 6 doses. 6 capsule 0    hydrOXYchloroQUINE (PLAQUENIL) 200 mg tablet Take 1 tablet (200 mg total) by mouth daily. 90 tablet 3    hydrOXYzine (ATARAX) 25 MG tablet Take 1 tablet (25 mg total) by mouth two (2) times a day as needed. 60 tablet 2    ipratropium (ATROVENT) 0.02 % nebulizer solution Inhale the contents of 1 vial (500 mcg total) by nebulization Four (4) times a day. 62.5 mL 12    lidocaine (XYLOCAINE) 5 % ointment Apply topically daily. 141.76 g 0    montelukast (SINGULAIR) 10 mg tablet Take 1 tablet (10 mg total) by mouth daily. 90 tablet 3    omalizumab (XOLAIR) 150 mg/mL syringe Inject the contents of 2 syringes (300 mg total) under the skin every twenty-eight (28) days. 2 mL 11    omeprazole (PRILOSEC) 20 MG capsule Take 1 capsule (20 mg total) by mouth daily. 90 capsule 3    pen needle, diabetic 31 gauge x 3/16 (5 mm) Ndle Use one new needle each morning for injection 100 each 3    pilocarpine (SALAGEN) 5 MG tablet Take 1 tablet (5 mg total) by mouth Three (3) times a day. 270 tablet 3    [EXPIRED] tiotropium bromide (SPIRIVA RESPIMAT) 2.5 mcg/actuation inhalation mist Inhale 1 puff daily. 4 g 11    traZODone (DESYREL) 50 MG tablet Take 0.5 tablets (25 mg total) by mouth nightly. 90 tablet 2    triamcinolone (KENALOG) 0.1 % cream Apply topically Two (2) times a day. Apply to back for itching (Patient not taking: Reported on 03/26/2022) 45 g 0     Current Facility-Administered Medications on File Prior to Visit   Medication Dose Route Frequency Provider Last Rate Last Admin    sodium chloride 3 % nebulizer solution 4 mL  4 mL Nebulization Once Ellin Mayhew, MD               SLEEPING:    not too good since I've had the shingles  Ms. Roussel messaged PCP Dr. Delane Ginger  She took 1/2 trazodone last night and got 5 hrs sleep.  She doesn't usually take trazodone regularly.        APPETITE:    so-so - shingles affecting appetite. She is making efforts for small amounts of healthy foods          SUBSTANCE USE:  No        SESSION FOCUS:      Today's Problem Solving addressed:    Reviewed patient's efforts and linked impact of effort to   1. Grief  2. Mixed anxiety depressive disorder    3. Poor sleep        Addressed:   Grief, mixed anxiety and depression, poor sleep    Husband with advanced dementia died in his sleep in the morning of 10/26/21  Patient, and her husband, Doreatha Martin, were together 41 years.  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     Friend from [place] she hadn't seen for 20 yrs and occasionally had phone contact with, initiated visit and insisted on coming for visit even after she told friend about illness and tried to decline or reschedule.  Friend complained about her home, dogs, one meal patient was able to cook and other things.  She thinks friend came to actually visit friend's niece for event rather patient  Daughter, [name], has been supportive of patient.  Patient spoke up about visitor's behavior and set limits.  Explored and discussed meaning and impact and hx of uncaring past friends.  Contrasted with support, connection, friendship with her husband, Sam, who passed.    Husband died 6 months ago  Discussed experience and meaning.    Continue to process grief, mixed symptoms of anxiety and depression.        Additional:     Medication adherence and barriers to the treatment plan have been addressed. Opportunities to optimize healthy behaviors have been discussed. Patient / caregiver voiced understanding.           ASSESSMENT      ENGAGEMENT: Patient presented a willingness to participate in treatment.  PARTICIPATION QUALITY:    Active  MOOD:  hurting related to shingles diagnosis 03/29/22, 2nd occurrence       AFFECT:  FULL RANGE   MENTAL STATUS:     alert and oriented  MODES OF INTERVENTION used in this session:    Clarification, Exploration, Problem Solving, Support, or Week review      TREATMENT PLAN    Short term goal focused counseling for identified treatment goals: [x]  Reduce PHQ / GAD by 5 points  []  Get or keep PHQ / GAD under 10 points  [x]  Stress and self-care management;   []  Referral for outside treatment  []  Bridge to outside treatment  [x]  Other:   Coping skills, resource information, address anticipatory grief     Patient-stated health goal:   Coping skills to get thru the day when I???m totally frustrated by others and what they say and do. Coping skills to overcome the anxiousness.    Next visit will be   2 of up to 12 visits.     [facility] PHONE visit Wed 6/14 at 2pm

## 2022-04-13 NOTE — Unmapped (Signed)
[facility] Clinical Assessment & Refill Coordination Note    Christy Ibarra, DOB: 1956/07/08  Phone: [phone] (home)     All above HIPAA information was verified with patient.     Was a Nurse, learning disability used for this call? No    Specialty Medication(s):   General Specialty: Tymlos     Current Outpatient Medications   Medication Sig Dispense Refill    abaloparatide 80 mcg (3,120 mcg/1.56 mL) PnIj Inject 80 mcg under the skin daily. 1.56 mL 6    albuterol 2.5 mg /3 mL (0.083 %) nebulizer solution Inhale 3 mL (2.5 mg total) by nebulization every six (6) hours as needed for wheezing or shortness of breath. 360 mL 11    albuterol HFA 90 mcg/actuation inhaler Inhale 1 puff every four (4) hours as needed for wheezing. 8.5 g 11    budesonide-formoteroL (SYMBICORT) 160-4.5 mcg/actuation inhaler Inhale 2 puffs Two (2) times a day. 10.2 g 11    citalopram (CELEXA) 40 MG tablet Take 1 tablet (40 mg total) by mouth daily. 90 tablet 2    diclofenac sodium (VOLTAREN) 1 % gel Apply 2 g topically four (4) times a day. 100 g 0    empty container (SHARPS CONTAINER) Misc Use as directed 1 each 2    EPINEPHrine (EPIPEN) 0.3 mg/0.3 mL injection Inject 0.3 mL (0.3 mg total) into the muscle once as needed for anaphylaxis for up to 1 dose. 2 each 1    ergocalciferol-1,250 mcg, 50,000 unit, (DRISDOL) 1,250 mcg (50,000 unit) capsule Take 1 capsule (1,250 mcg total) by mouth once a week for 6 doses. 6 capsule 0    hydrOXYchloroQUINE (PLAQUENIL) 200 mg tablet Take 1 tablet (200 mg total) by mouth daily. 90 tablet 3    hydrOXYzine (ATARAX) 25 MG tablet Take 1 tablet (25 mg total) by mouth two (2) times a day as needed. 60 tablet 2    ipratropium (ATROVENT) 0.02 % nebulizer solution Inhale the contents of 1 vial (500 mcg total) by nebulization Four (4) times a day. 62.5 mL 12    lidocaine (XYLOCAINE) 5 % ointment Apply topically daily. 141.76 g 0    montelukast (SINGULAIR) 10 mg tablet Take 1 tablet (10 mg total) by mouth daily. 90 tablet 3    omalizumab (XOLAIR) 150 mg/mL syringe Inject the contents of 2 syringes (300 mg total) under the skin every twenty-eight (28) days. 2 mL 11    omeprazole (PRILOSEC) 20 MG capsule Take 1 capsule (20 mg total) by mouth daily. 90 capsule 3    pen needle, diabetic 31 gauge x 3/16 (5 mm) Ndle Use one new needle each morning for injection 100 each 3    pilocarpine (SALAGEN) 5 MG tablet Take 1 tablet (5 mg total) by mouth Three (3) times a day. 270 tablet 3    traZODone (DESYREL) 50 MG tablet Take 0.5 tablets (25 mg total) by mouth nightly. 90 tablet 2    triamcinolone (KENALOG) 0.1 % cream Apply topically Two (2) times a day. Apply to back for itching (Patient not taking: Reported on 03/26/2022) 45 g 0     Current Facility-Administered Medications   Medication Dose Route Frequency Provider Last Rate Last Admin    sodium chloride 3 % nebulizer solution 4 mL  4 mL Nebulization Once Ellin Mayhew, MD            Changes to medications: [name] reports no changes at this time.    Allergies  Allergen Reactions    [facility] Dust Cough       Changes to allergies: No    SPECIALTY MEDICATION ADHERENCE     Tymlos 80  mcg : 14 days of medicine on hand       Medication Adherence    Patient reported X missed doses in the last month: 1  Specialty Medication: Tymlos          Specialty medication(s) dose(s) confirmed: Regimen is correct and unchanged.     Are there any concerns with adherence? No - patient skipped one dose when she had shingles in early May    Adherence counseling provided? Not needed    CLINICAL MANAGEMENT AND INTERVENTION      Clinical Benefit Assessment:    Do you feel the medicine is effective or helping your condition? Yes    Clinical Benefit counseling provided? Not needed    Adverse Effects Assessment:    Are you experiencing any side effects? No    Are you experiencing difficulty administering your medicine? No    Quality of Life Assessment:    Quality of Life Rheumatology  Oncology  Dermatology  Cystic Fibrosis          How many days over the past month did your osteoporosis  keep you from your normal activities? For example, brushing your teeth or getting up in the morning. Patient declined to answer    Have you discussed this with your provider? Not needed    Acute Infection Status:    Acute infections noted within Epic:  No active infections  Patient reported infection: None    Therapy Appropriateness:    Is therapy appropriate and patient progressing towards therapeutic goals? Yes, therapy is appropriate and should be continued    DISEASE/MEDICATION-SPECIFIC INFORMATION      N/A    PATIENT SPECIFIC NEEDS     Does the patient have any physical, cognitive, or cultural barriers? No    Is the patient high risk? No    Does the patient require a Care Management Plan? No     SOCIAL DETERMINANTS OF HEALTH     At the [facility], we have learned that life circumstances - like trouble affording food, housing, utilities, or transportation can affect the health of many of our patients.   That is why we wanted to ask: are you currently experiencing any life circumstances that are negatively impacting your health and/or quality of life? Patient declined to answer    Social Determinants of Health     Financial Resource Strain: High Risk    Difficulty of Paying Living Expenses: Hard   Internet Connectivity: Not on file   Food Insecurity: Food Insecurity Present    Worried About Programme researcher, broadcasting/film/video in the Last Year: Sometimes true    Barista in the Last Year: Sometimes true   Tobacco Use: Medium Risk    Smoking Tobacco Use: Former    Smokeless Tobacco Use: Never    Passive Exposure: Not on file   Housing/Utilities: Low Risk     Within the past 12 months, have you ever stayed: outside, in a car, in a tent, in an overnight shelter, or temporarily in someone else's home (i.e. couch-surfing)?: No    Are you worried about losing your housing?: No    Within the past 12 months, have you been unable to get utilities (heat, electricity) when it was really needed?: No   Alcohol Use: Not At Risk  How often do you have a drink containing alcohol?: Never    How many drinks containing alcohol do you have on a typical day when you are drinking?: 1 - 2    How often do you have 5 or more drinks on one occasion?: Never   Transportation Needs: No Transportation Needs    Lack of Transportation (Medical): No    Lack of Transportation (Non-Medical): No   Substance Use: Low Risk     Taken prescription drugs for non-medical reasons: Never    Taken illegal drugs: Never    Patient indicated they have taken drugs in the past year for non-medical reasons: Yes, [positive answer(s)]: Not on file   Health Literacy: Medium Risk    : Sometimes   Physical Activity: Not on file   Interpersonal Safety: Not on file   Stress: Not on file   Intimate Partner Violence: Not At Risk    Fear of Current or Ex-Partner: No    Emotionally Abused: No    Physically Abused: No    Sexually Abused: No   Depression: At risk    PHQ-2 Score: 3   Social Connections: Not on file       Would you be willing to receive help with any of the needs that you have identified today? Not applicable       SHIPPING     Specialty Medication(s) to be Shipped:   General Specialty: Tymlos    Other medication(s) to be shipped: No additional medications requested for fill at this time     Changes to insurance: No    Delivery Scheduled: Yes, Expected medication delivery date: 5/31.     Medication will be delivered via Same Day Courier to the confirmed prescription address in [facility].    The patient will receive a drug information handout for each medication shipped and additional FDA Medication Guides as required.  Verified that patient has previously received a Conservation officer, historic buildings and a Surveyor, mining.    The patient or caregiver noted above participated in the development of this care plan and knows that they can request review of or adjustments to the care plan at any time.      All of the patient's questions and concerns have been addressed.    Clydell Hakim   [facility] Specialty Pharmacist

## 2022-04-14 ENCOUNTER — Ambulatory Visit: Admit: 2022-04-14 | Discharge: 2022-04-15 | Payer: MEDICAID | Attending: Internal Medicine | Primary: Internal Medicine

## 2022-04-14 DIAGNOSIS — B029 Zoster without complications: Principal | ICD-10-CM

## 2022-04-14 MED ORDER — GABAPENTIN 100 MG CAPSULE
ORAL_CAPSULE | Freq: Three times a day (TID) | ORAL | 0 refills | 60 days | Status: CP
Start: 2022-04-14 — End: 2023-04-14
  Filled 2022-04-14: qty 150, 50d supply, fill #0

## 2022-04-14 NOTE — Unmapped (Signed)
Internal Medicine Clinic Visit    Reason for Visit: Shingles    A/P:    1. Herpes zoster without complication  Diagnosed by outside urgent care and treated with valtrex. No new lesions. Ongoing pain not relieved with ibuprofen, lidocaine and tylenol. Offered opioids, but declined. Too early to call this PHN, but given limited options to help with pain, will trial gabapentin. Counseled on risks (especially falls! Daughter will keep an eye) and provided titration plan in AVS. She is to continue tylenol and ibuprofen as well.    __________________________________________________________    HPI:    Shingles pain started two weeks ago. Started on valtrex very early, within 1 day. Took a 7-day course. All lesions have crusted over and no new lesions.     Current pain regimen:  - Tylenol: 1000 mg every 8 hours   - Lidocaine patches and cream - worsened the burning  - Ibuprofen: 400 mg every 4 hours  __________________________________________________________    Problem List:  Patient Active Problem List   Diagnosis   ??? Abnormal gait   ??? Anxiety   ??? Arthralgia of hip   ??? Family history of genetic disorder   ??? Family history of malignant neoplasm of breast   ??? Family history of malignant neoplasm of cervix   ??? Mixed anxiety depressive disorder   ??? Scoliosis   ??? Sjogren's syndrome with keratoconjunctivitis sicca (CMS-HCC)   ??? Leukopenia   ??? Age-related nuclear cataract of both eyes   ??? Poor sleep   ??? Grief   ??? Osteoporosis with current pathological fracture   ??? Bronchiectasis without complication (CMS-HCC)   ??? Asthma-COPD overlap syndrome (CMS-HCC)   ??? Esophageal dysphagia       Medications:  Reviewed in EPIC  __________________________________________________________    Physical Exam:   Vital Signs:  Vitals:    04/14/22 1338   BP: 127/65   BP Site: L Arm   BP Position: Sitting   BP Cuff Size: Medium   Pulse: 90   Resp: 17   Temp: 36.6 ??C (97.8 ??F)   SpO2: 98%      There is no height or weight on file to calculate BMI.    Gen: Appears quite uncomfortable, oriented  Skin: Healing lesions in S2 distributing from buttock down the back of leg. No new vesicles noted.

## 2022-04-14 NOTE — Unmapped (Addendum)
Start gabapentin 100 mg three times daily. If no sedation and ongoing pain, increase to 200 mg three times a day.  Fine to eventually increase to 300 mg three times daily.     Do not take more than three times a day.    Continue tylenol 1000 mg every 8 hours.   Ibuprofen 400 mg every 6 hours

## 2022-04-14 NOTE — Unmapped (Signed)
Fit test was given to patient with instruction. Patient understood those instructions and was told to mail back at given address on front of card.

## 2022-04-21 NOTE — Unmapped (Signed)
Called patient to schedule first Xolair teaching.  Patient states they have shingles and still have some blisters and nerve pain.  Patient asks if shot/teaching can be delayed until they are feeling better.  Informed patient to call clinic back when they are feeling better and blisters have dried up.  Patient verbalized understanding.

## 2022-04-22 MED FILL — TYMLOS 80 MCG/DOSE (3,120 MCG/1.56 ML) SUBCUTANEOUS PEN INJECTOR: SUBCUTANEOUS | 28 days supply | Qty: 1.56 | Fill #1

## 2022-04-22 NOTE — Unmapped (Signed)
[facility] Administered Medication Refill Coordination Note      NAME:Karema Judie Petit Correia DOB: October 31, 1956      Medication: Xolair    Day Supply: 28 days      SHIPPING      Next delivery from [facility] [phone]) to the [facility] for PAIGHTON GODETTE is scheduled for .06/08    Clinic contact: Geanie Berlin     Patient's next nurse visit for administration: unsure.    We will follow up with clinic monthly for standard refill processing and delivery.      Nance Mccombs Samella Parr  Specialty Pharmacy Technician

## 2022-04-30 MED FILL — XOLAIR 150 MG/ML SUBCUTANEOUS SYRINGE: SUBCUTANEOUS | 28 days supply | Qty: 2 | Fill #1

## 2022-05-06 ENCOUNTER — Institutional Professional Consult (permissible substitution): Admit: 2022-05-06 | Discharge: 2022-05-07 | Payer: MEDICAID | Attending: Clinical | Primary: Clinical

## 2022-05-06 DIAGNOSIS — F418 Other specified anxiety disorders: Principal | ICD-10-CM

## 2022-05-06 DIAGNOSIS — Z72820 Sleep deprivation: Principal | ICD-10-CM

## 2022-05-06 DIAGNOSIS — F4321 Adjustment disorder with depressed mood: Principal | ICD-10-CM

## 2022-05-06 NOTE — Unmapped (Signed)
INTERNAL MEDICINE COUNSELING SESSION NOTE    LENGTH OF SESSION:  Psychotherapy Session 30 minutes    TYPE OF PSYCHOTHERAPY: Behavioral, Supportive    REASON FOR TREATMENT: Problem Solving Treatment and Mind Body Stress Reduction skill training for caregiver stress, mixed anxiety and depression; link impact of effort to mood.               The patient reports they are currently: at home. I spent 30 minutes on the phone with the patient on the date of service. I spent an additional 10 minutes on pre- and post-visit activities on the date of service    The patient was physically located in [place] or a state in which I am permitted to provide care. The patient and/or parent/guardian understood that s/he may incur co-pays and cost sharing, and agreed to the telemedicine visit. The visit was reasonable and appropriate under the circumstances given the patient's presentation at the time.    The patient and/or parent/guardian has been advised of the potential risks and limitations of this mode of treatment (including, but not limited to, the absence of in-person examination) and has agreed to be treated using telemedicine. The patient's/patient's family's questions regarding telemedicine have been answered.     If the visit was completed in an ambulatory setting, the patient and/or parent/guardian has also been advised to contact their provider???s office for worsening conditions, and seek emergency medical treatment and/or call 911 if the patient deems either necessary.              REVIEW OF FUNCTIONING SINCE LAST VISIT    MOOD (0-10) =    alright     Rating deferred    SATISFACTION WITH EFFORT IN CARING FOR MOOD (0-10) =    Rating deferred    PAIN:     4 - still getting over shingles    At last visit was:  8    PHQ-9 SCORE:     Rating deferred    GAD-7 SCORE:     Rating deferred      CURRENT SUICIDAL/HOMICIDAL IDEATION:  None      PROBLEM LIST:    Patient Active Problem List   Diagnosis    Abnormal gait Anxiety    Arthralgia of hip    Family history of genetic disorder    Family history of malignant neoplasm of breast    Family history of malignant neoplasm of cervix    Mixed anxiety depressive disorder    Scoliosis    Sjogren's syndrome with keratoconjunctivitis sicca (CMS-HCC)    Leukopenia    Age-related nuclear cataract of both eyes    Poor sleep    Grief    Osteoporosis with current pathological fracture    Bronchiectasis without complication (CMS-HCC)    Asthma-COPD overlap syndrome (CMS-HCC)    Esophageal dysphagia         MEDICATION COMPLIANCE:     Taking all medications regularly?  yes     Any changes in medication since last visit?  no     Any missed doses? yes - a couple of doses of tamlosin for osteoporosis      Current Outpatient Medications on File Prior to Visit   Medication Sig Dispense Refill    abaloparatide 80 mcg (3,120 mcg/1.56 mL) PnIj Inject 80 mcg under the skin daily. 1.56 mL 6    albuterol 2.5 mg /3 mL (0.083 %) nebulizer solution Inhale 3 mL (2.5 mg total) by nebulization every six (6) hours  as needed for wheezing or shortness of breath. 360 mL 11    albuterol HFA 90 mcg/actuation inhaler Inhale 1 puff every four (4) hours as needed for wheezing. 8.5 g 11    budesonide-formoteroL (SYMBICORT) 160-4.5 mcg/actuation inhaler Inhale 2 puffs Two (2) times a day. 10.2 g 11    citalopram (CELEXA) 40 MG tablet Take 1 tablet (40 mg total) by mouth daily. 90 tablet 2    diclofenac sodium (VOLTAREN) 1 % gel Apply 2 g topically four (4) times a day. 100 g 0    empty container (SHARPS CONTAINER) Misc Use as directed 1 each 2    EPINEPHrine (EPIPEN) 0.3 mg/0.3 mL injection Inject 0.3 mL (0.3 mg total) into the muscle once as needed for anaphylaxis for up to 1 dose. 2 each 1    [EXPIRED] ergocalciferol-1,250 mcg, 50,000 unit, (DRISDOL) 1,250 mcg (50,000 unit) capsule Take 1 capsule (1,250 mcg total) by mouth once a week for 6 doses. 6 capsule 0    gabapentin (NEURONTIN) 100 MG capsule Take 1 capsule (100 mg total) by mouth Three (3) times a day. 180 capsule 0    hydrOXYchloroQUINE (PLAQUENIL) 200 mg tablet Take 1 tablet (200 mg total) by mouth daily. 90 tablet 3    hydrOXYzine (ATARAX) 25 MG tablet Take 1 tablet (25 mg total) by mouth two (2) times a day as needed. 60 tablet 2    ipratropium (ATROVENT) 0.02 % nebulizer solution Inhale the contents of 1 vial (500 mcg total) by nebulization Four (4) times a day. 62.5 mL 12    lidocaine (XYLOCAINE) 5 % ointment Apply topically daily. 141.76 g 0    montelukast (SINGULAIR) 10 mg tablet Take 1 tablet (10 mg total) by mouth daily. 90 tablet 3    omalizumab (XOLAIR) 150 mg/mL syringe Inject the contents of 2 syringes (300 mg total) under the skin every twenty-eight (28) days. 2 mL 11    omeprazole (PRILOSEC) 20 MG capsule Take 1 capsule (20 mg total) by mouth daily. 90 capsule 3    pen needle, diabetic 31 gauge x 3/16 (5 mm) Ndle Use one new needle each morning for injection 100 each 3    pilocarpine (SALAGEN) 5 MG tablet Take 1 tablet (5 mg total) by mouth Three (3) times a day. 270 tablet 3    traZODone (DESYREL) 50 MG tablet Take 0.5 tablets (25 mg total) by mouth nightly. 90 tablet 2    triamcinolone (KENALOG) 0.1 % cream Apply topically Two (2) times a day. Apply to back for itching (Patient not taking: Reported on 03/26/2022) 45 g 0     Current Facility-Administered Medications on File Prior to Visit   Medication Dose Route Frequency Provider Last Rate Last Admin    sodium chloride 3 % nebulizer solution 4 mL  4 mL Nebulization Once Ellin Mayhew, MD               SLEEPING:    good, not fantastic, but better   Getting about 5-7 hours sleep with one interruption  Not going on the computer when she can't sleep.  Reading to help her get back to sleep.   Some napping  Not missing sleep for a night or more a week.          APPETITE:    good          SUBSTANCE USE:  No        SESSION FOCUS:  Addressed:  1. Grief    2.  Mixed anxiety depressive disorder    3. Poor sleep Discussed efforts since last contact.    Not going on the computer when she can't sleep.  Reading to help her get back to sleep.   Doing crafts  Currently working on a decorative birdhouse  Also working on a [employer] bird image at night to help reduce anxiety - relaxing  Reading more  Got and reading a Patent attorney with dogs on the porch.      Today's Problem Solving addressed:    Reviewed patient's efforts and linked impact of effort to   1. Grief    2. Mixed anxiety depressive disorder    3. Poor sleep        Addressed:   Grief, mixed anxiety and depression, poor sleep     Husband with advanced dementia died in his sleep in the morning of 10/26/21  Patient, and her husband, Doreatha Martin, were together 41 years.  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  Getting more comfortable with setting limits with or ignoring negative people  Reduced ruminating      Grief - improved coping, continuing to process emotions getting back to [focus on] living. Continuing to process change in role from caregiver to focus on her own life    Anxiety - better managed with engagement in crafts, improved sleep, social interaction, communication with younger daughter, [name]    Sleep is improving with efforts    I reflected patient's efforts and encouraged her continued efforts        Additional:     Medication adherence and barriers to the treatment plan have been addressed. Opportunities to optimize healthy behaviors have been discussed. Patient / caregiver voiced understanding.                 ASSESSMENT      ENGAGEMENT: Patient presented a willingness to participate in treatment.  PARTICIPATION QUALITY:    Active  MOOD:  alright     AFFECT:  CONGRUENT   MENTAL STATUS:     alert and oriented, less anxious  MODES OF INTERVENTION used in this session:    Clarification, Exploration, Problem Solving, Support, or Week review      TREATMENT PLAN    Short term goal focused counseling for identified treatment goals:   [x]  Reduce PHQ / GAD by 5 points  []  Get or keep PHQ / GAD under 10 points  [x]  Stress and self-care management;   []  Referral for outside treatment  []  Bridge to outside treatment  [x]  Other:   Coping skills, resource information, address anticipatory grief     Patient-stated health goal:   Coping skills to get thru the day when I???m totally frustrated by others and what they say and do. Coping skills to overcome the anxiousness.    Next visit will be   3 of up to 12 visits.     Return PHONE visit Tues 7/18 at 2pm

## 2022-05-06 NOTE — Unmapped (Signed)
LUMBAR PUNCTURE  PRE PROCEDURE   NURSE SCREEN        Pre-procedural planning     Patient Name: Christy Ibarra  Patient MRN: [id number]    Indications: Abnormal Gait    Please confirm that medication reconciliation has been done in this phone encounter.   Completed    BMI 18.42    INR normal? NA  PLT normal? NA  Brain CT or MRI reviewed by referring physician? Unknown  Exam done by referring physician? Yes  Have any CSF orders been placed by ordering physician? Yes      Antiplatelet Agents: No  Known Bleeding Diathesis: No  DVT Prophylaxis: No  Systemic Anticoagulation: No      Has the patient had lumbar spine surgery in the past, and has fusions or implanted hardware been placed?  No      I went over the following risks and complications: headache, backache, bleeding that could result in paralysis, and infection.

## 2022-05-14 ENCOUNTER — Ambulatory Visit: Admit: 2022-05-14 | Discharge: 2022-05-14 | Payer: MEDICAID

## 2022-05-14 MED ADMIN — gadobenate dimeglumine (MULTIHANCE) 529 mg/mL (0.1mmol/0.2mL) solution 10 mL: 10 mL | INTRAVENOUS | @ 17:00:00 | Stop: 2022-05-14

## 2022-05-14 MED FILL — PILOCARPINE 5 MG TABLET: ORAL | 90 days supply | Qty: 270 | Fill #1

## 2022-05-14 MED FILL — SYMBICORT 160 MCG-4.5 MCG/ACTUATION HFA AEROSOL INHALER: RESPIRATORY_TRACT | 30 days supply | Qty: 10.2 | Fill #1

## 2022-05-19 NOTE — Unmapped (Signed)
05/19/22  10:37 AM  Called patient to schedule Xolair teaching.  Informed patient that teaching takes two hours and they will need to bring their epi-pen.  Informed patient that they will be watched for any reactions during clinic visit.  Patient verbalized understanding.

## 2022-05-20 NOTE — Unmapped (Addendum)
[facility] Administered Medication Refill Coordination Note      NAME:Karsynn Judie Petit Cirigliano DOB: [birth year]      Medication: Xolair    Day Supply: 28 days      SHIPPING      Next delivery from [facility] [phone]) to  [facility]  for LYNNELL FIUMARA is scheduled for 07/06    Clinic contact: Geanie Berlin     Patient's next nurse visit for administration: 07/10.    We will follow up with clinic monthly for standard refill processing and delivery.      Sarae Nicholes Samella Parr  Specialty Pharmacy Technician

## 2022-05-22 ENCOUNTER — Ambulatory Visit: Admit: 2022-05-22 | Discharge: 2022-05-23 | Payer: MEDICAID

## 2022-05-22 DIAGNOSIS — R269 Unspecified abnormalities of gait and mobility: Principal | ICD-10-CM

## 2022-05-22 LAB — SERUM IGG & ALBUMIN FOR OLIGOCLONAL BANDSS
ALBUMIN: 3.6 g/dL (ref 3.4–5.0)
GAMMAGLOBULIN; IGG: 1655 mg/dL (ref 646–2013)

## 2022-05-22 NOTE — Unmapped (Signed)
Addended by: Andreas Newport, Donald Pore L on: 05/22/2022 04:07 PM     Modules accepted: Level of Service

## 2022-05-22 NOTE — Unmapped (Signed)
Neurology  Clinic    Lumbar Puncture   Procedure Note        Pre-procedural planning     Patient Name: Christy Ibarra  Patient MRN: [id number]      Indications: Diagnostic evaluation for demyelinating disease.    Contraindications to performing lumbar puncture: Local skin infections over the proposed puncture site, known elevated ICP other than in IIH or cryptococcal meningitis, and/or an uncontrolled bleeding diathesis.     Imaging prior to lumbar puncture: Generally, imaging should be obtained prior to performing a lumbar puncture if the patient is > 15 years old, immunocompromised, has had a seizure within one week of presentation, has an abnormal level of consciousness, or an abnormal neurologic exam (including papilledema or loss of venous pulsations on fundoscopy). Imaging has been obtained and I do not appreciate any evidence of elevated ICP so it is safe to proceed with the lumbar puncture.    Known Bleeding Diathesis: Patient/caregiver denies any known bleeding or platelet disorder.       DVT Prophylaxis: No DVT prophylaxis.    Antiplatelet Agents: This patient is not on an antiplatelet agent.    Systemic Anticoagulation: This patient is not on full systemic anticoagulation.    As part of the consent process I went over the following risks and complications: headache, backache, bleeding that could result in paralysis, and infection.       Platelet   Date Value Ref Range Status   02/17/2022 165 150 - 450 10*9/L Final         Ivonne Andrew, MD, May 22, 2022, 8:19 AM      Procedure Details     Time-out performed immediately prior to the procedure.    Patient was placed in the right lateral decubitus position with hips and neck in flexion.    The superior aspect of the iliac crests were identified, with the traverse demarcating the L4-L5 interspace. This area was prepped and draped in the usual sterile fashion. Local anesthesia with 1% lidocaine was applied subcutaneously then deep to the skin. The spinal needle with trocar was introduced at the L4 - L5 interspace with frequent removal of the trocar to evaluate for cerebrospinal fluid. CSF samples were collected in four separate tubes and sent to the lab after proper labeling. The spinal needle with trocar was removed, with minimal bleeding noted upon removal. A sterile bandage was placed over the puncture site after holding pressure.     Findings     20 mL of clear spinal fluid was obtained.  An opening pressure was not obtained.    CSF was sent for cell count with differential, protein, glucose, WJX:BJYNWGN ratio, oligoclonal bands, and cytology.           Complications     None; patient tolerated the procedure well.

## 2022-05-27 MED FILL — XOLAIR 150 MG/ML SUBCUTANEOUS SYRINGE: SUBCUTANEOUS | 28 days supply | Qty: 2 | Fill #2

## 2022-05-28 LAB — OLIGOCLONAL BAND EVALUATION
ALBUMIN FOR RATIO: 3600 mg/dL (ref 3500–5000)
CSF IGG INDEX: 0.5 (ref 0.3–0.8)
RATIO PLOT X-AXIS: 5.9 (ref <9.0)

## 2022-06-01 ENCOUNTER — Institutional Professional Consult (permissible substitution): Admit: 2022-06-01 | Discharge: 2022-06-02 | Payer: MEDICAID

## 2022-06-01 NOTE — Unmapped (Signed)
Pt arrived to clinic for teaching of Xolair. Pt was given education on the purpose of medication and was informed of potential side effects. Pt was given instruction of how to prep, inject, and dispose of the medication. Pt was able to verify understanding using the teach back method.     Nurse was able to safely inject medication into their left and right lower abdomen. No complications noted during injection. Pt stayed two hours after injection for monitoring. No signs of moderate/severe reaction noted during stay.      All questions and concerns appropriately answered during visit. Pt instructed to call the clinic nurse line should they develop any questions or concerns regarding medication.     Nursing Assessment completed.   Drug allergies assessed.     Med supplied: [facility] Supplied     Drug Administered: Xolair 300mg    Route: SubQ  Frequency: 28 days     Right lower abdomen: 150 mg  Lot #: [id number] / Exp 01/2023    Left lower abdomen: 150 mg  Lot #: [id number] / Exp 01/2023    Assessment:  Tolerated procedure well, no side effects. Patient has epi pen.   Patient aware to contact office if side effects occur.     RTC: 28 days    Delice Lesch, RN

## 2022-06-09 ENCOUNTER — Ambulatory Visit: Admit: 2022-06-09 | Discharge: 2022-07-08 | Payer: MEDICAID

## 2022-06-09 ENCOUNTER — Ambulatory Visit
Admit: 2022-06-09 | Payer: MEDICAID | Attending: Rehabilitative and Restorative Service Providers" | Primary: Rehabilitative and Restorative Service Providers"

## 2022-06-09 ENCOUNTER — Ambulatory Visit: Admit: 2022-06-09 | Payer: MEDICAID

## 2022-06-09 ENCOUNTER — Institutional Professional Consult (permissible substitution): Admit: 2022-06-09 | Discharge: 2022-06-10 | Payer: MEDICAID | Attending: Clinical | Primary: Clinical

## 2022-06-09 DIAGNOSIS — F418 Other specified anxiety disorders: Principal | ICD-10-CM

## 2022-06-09 DIAGNOSIS — F4321 Adjustment disorder with depressed mood: Principal | ICD-10-CM

## 2022-06-09 DIAGNOSIS — Z72820 Sleep deprivation: Principal | ICD-10-CM

## 2022-06-09 NOTE — Unmapped (Signed)
INTERNAL MEDICINE COUNSELING SESSION NOTE    LENGTH OF SESSION:  Psychotherapy Session 30 minutes    TYPE OF PSYCHOTHERAPY: Behavioral, Supportive    REASON FOR TREATMENT: Problem Solving Treatment and Mind Body Stress Reduction skill training for caregiver stress, mixed anxiety and depression; link impact of effort to mood.               The patient reports they are currently: at home. I spent 30 minutes on the phone with the patient on the date of service. I spent an additional 10 minutes on pre- and post-visit activities on the date of service    The patient was physically located in [place] or a state in which I am permitted to provide care. The patient and/or parent/guardian understood that s/he may incur co-pays and cost sharing, and agreed to the telemedicine visit. The visit was reasonable and appropriate under the circumstances given the patient's presentation at the time.    The patient and/or parent/guardian has been advised of the potential risks and limitations of this mode of treatment (including, but not limited to, the absence of in-person examination) and has agreed to be treated using telemedicine. The patient's/patient's family's questions regarding telemedicine have been answered.     If the visit was completed in an ambulatory setting, the patient and/or parent/guardian has also been advised to contact their provider???s office for worsening conditions, and seek emergency medical treatment and/or call 911 if the patient deems either necessary.              REVIEW OF FUNCTIONING SINCE LAST VISIT    MOOD (0-10) =    Ok  Rating deferred    SATISFACTION WITH EFFORT IN CARING FOR MOOD (0-10) =    Rating deferred    PAIN:     6 - just got home from PT and residual shingles pain    At last visit was:    4 - still getting over shingles      PHQ-9 SCORE:     Rating deferred    GAD-7 SCORE:     Rating deferred      CURRENT SUICIDAL/HOMICIDAL IDEATION:  None      PROBLEM LIST:    Patient Active Problem List   Diagnosis    Abnormal gait    Anxiety    Arthralgia of hip    Family history of genetic disorder    Family history of malignant neoplasm of breast    Family history of malignant neoplasm of cervix    Mixed anxiety depressive disorder    Scoliosis    Sjogren's syndrome with keratoconjunctivitis sicca (CMS-HCC)    Leukopenia    Age-related nuclear cataract of both eyes    Poor sleep    Grief    Osteoporosis with current pathological fracture    Bronchiectasis without complication (CMS-HCC)    Asthma-COPD overlap syndrome (CMS-HCC)    Esophageal dysphagia         MEDICATION COMPLIANCE:     Taking all medications regularly?  yes     Any changes in medication since last visit?  no     Any missed doses? no       Current Outpatient Medications on File Prior to Visit   Medication Sig Dispense Refill    abaloparatide 80 mcg (3,120 mcg/1.56 mL) PnIj Inject 80 mcg under the skin daily. 1.56 mL 6    albuterol 2.5 mg /3 mL (0.083 %) nebulizer solution Inhale 3 mL (2.5 mg total)  by nebulization every six (6) hours as needed for wheezing or shortness of breath. 360 mL 11    albuterol HFA 90 mcg/actuation inhaler Inhale 1 puff every four (4) hours as needed for wheezing. 8.5 g 11    budesonide-formoteroL (SYMBICORT) 160-4.5 mcg/actuation inhaler Inhale 2 puffs Two (2) times a day. 10.2 g 11    citalopram (CELEXA) 40 MG tablet Take 1 tablet (40 mg total) by mouth daily. 90 tablet 2    diclofenac sodium (VOLTAREN) 1 % gel Apply 2 g topically four (4) times a day. 100 g 0    empty container (SHARPS CONTAINER) Misc Use as directed 1 each 2    EPINEPHrine (EPIPEN) 0.3 mg/0.3 mL injection Inject 0.3 mL (0.3 mg total) into the muscle once as needed for anaphylaxis for up to 1 dose. 2 each 1    gabapentin (NEURONTIN) 100 MG capsule Take 1 capsule (100 mg total) by mouth Three (3) times a day. 180 capsule 0    hydrOXYchloroQUINE (PLAQUENIL) 200 mg tablet Take 1 tablet (200 mg total) by mouth daily. 90 tablet 3    hydrOXYzine (ATARAX) 25 MG tablet Take 1 tablet (25 mg total) by mouth two (2) times a day as needed. 60 tablet 2    ipratropium (ATROVENT) 0.02 % nebulizer solution Inhale the contents of 1 vial (500 mcg total) by nebulization Four (4) times a day. 62.5 mL 12    lidocaine (XYLOCAINE) 5 % ointment Apply topically daily. 141.76 g 0    montelukast (SINGULAIR) 10 mg tablet Take 1 tablet (10 mg total) by mouth daily. 90 tablet 3    omalizumab (XOLAIR) 150 mg/mL syringe Inject the contents of 2 syringes (300 mg total) under the skin every twenty-eight (28) days. 2 mL 11    omeprazole (PRILOSEC) 20 MG capsule Take 1 capsule (20 mg total) by mouth daily. 90 capsule 3    pen needle, diabetic 31 gauge x 3/16 (5 mm) Ndle Use one new needle each morning for injection 100 each 3    pilocarpine (SALAGEN) 5 MG tablet Take 1 tablet (5 mg total) by mouth Three (3) times a day. 270 tablet 3    traZODone (DESYREL) 50 MG tablet Take 0.5 tablets (25 mg total) by mouth nightly. 90 tablet 2    triamcinolone (KENALOG) 0.1 % cream Apply topically Two (2) times a day. Apply to back for itching 45 g 0     Current Facility-Administered Medications on File Prior to Visit   Medication Dose Route Frequency Provider Last Rate Last Admin    sodium chloride 3 % nebulizer solution 4 mL  4 mL Nebulization Once Ellin Mayhew, MD               SLEEPING:    pretty good Getting 5 to 6 hours sleep. Had been 5-7 hours. Pain affecting sleep.          APPETITE:    pretty good          SUBSTANCE USE:  No        SESSION FOCUS:  Addressed:  1. Grief    2. Mixed anxiety depressive disorder    3. Poor sleep        Discussed efforts since last contact.  A lot of reading  Doing her devotions  Getting out some for errands, less time outside with heat  Crafting   Taking time for her and to reflect on relationship  Visits from and time with friends  Staying  firm in keeping boundaries/limiting time with people who are negative or unsupportive          Today's Problem Solving addressed:    Reviewed patient's efforts and linked impact of effort to   1. Grief    2. Mixed anxiety depressive disorder    3. Poor sleep        Addressed: Grief, mixed anxiety and depression, poor sleep     Husband with advanced dementia died in his sleep in the morning of 10/26/21  Patient, and her husband, Christy Ibarra, were together 41 years.  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  Getting more comfortable with setting limits with or ignoring negative people  Reduced ruminating       Grief - it's better than it's been, not focusing on it as much. Accepting more that Christy Ibarra is gone. Sense of Sam's presence in other things seeing butterfly.  Staying firm in keeping boundaries/limiting time with people who are negative or unsupportive  September would've been 44nd wedding anniversary  Explored and discussed her experiences, efforts, and boundary setting.     Anxiety - worrying less and focusing on what she can do  Not dwelling on things out of her control and or something negative     Sleep - challenging since last counseling visit related to discomfort/pain related to shingles        Discussed issues presented and options for continued and/or improved self care management           Additional:     Medication adherence and barriers to the treatment plan have been addressed. Opportunities to optimize healthy behaviors have been discussed. Patient / caregiver voiced understanding.                 ASSESSMENT      ENGAGEMENT: Patient presented a willingness to participate in treatment.  PARTICIPATION QUALITY:    Active  MOOD:  OK     AFFECT:  FULL RANGE and CONGRUENT   MENTAL STATUS:     alert and oriented  MODES OF INTERVENTION used in this session:    Clarification, Exploration, Problem Solving, Support, Reframing, or Week review      TREATMENT PLAN    Note:  Sept 5th would've been their 44nd anniversary    Short term goal focused counseling for identified treatment goals:   [x]  Reduce PHQ / GAD by 5 points  []  Get or keep PHQ / GAD under 10 points  [x]  Stress and self-care management;   []  Referral for outside treatment  []  Bridge to outside treatment  [x]  Other:   Coping skills, resource information, address anticipatory grief     Patient-stated health goal:   Coping skills to get thru the day when I???m totally frustrated by others and what they say and do. Coping skills to overcome the anxiousness.    Next visit will be   4 of up to 12 visits.     Return PHONE visit Tue 9/12 at 3pm

## 2022-06-09 NOTE — Unmapped (Signed)
[facility] PT [facility]  OUTPATIENT PHYSICAL THERAPY  06/09/2022  Note Type: Evaluation       Patient Name: Christy Ibarra  Date of Birth:16-Dec-1955  Diagnosis:   Encounter Diagnoses   Name Primary?   ??? Chronic midline low back pain with bilateral sciatica    ??? Sjogren's syndrome with keratoconjunctivitis sicca (CMS-HCC) Yes   ??? Risk for falls      Referring Provider: Johnsie Cancel    Date of Onset of Impairment: No date available  Date PT Care Plan Established or Reviewed: No date available  Date PT Treatment Started: No date available     Plan of Care Effective Date: 06/09/22 to 09/09/22  Session Number:  1    ASSESSMENT & PLAN   Assessment  Assessment details:    Christy Ibarra is a pleasant 66 y.o. female who presents for Physical Therapy Evaluation with LBP/radicular pain.  She also presents with impaired balance, gait impairment, and increased risk for falls.  She will benefit from skilled Physical Therapy intervention to address the moderate impairments listed below and to assist the patient in maximizing her functional independence and safe return to prior level of function.           Impairments: decreased endurance, pain, decreased strength, decreased range of motion and impaired motor control      Personal Factors/Comorbidities: 3+    Specific Comorbidities: Bronchiectasis, Asthma-COPD overlap syndrome, scoliosis, osteoporosis, sjogren's, abnormal gait, anxiety, leukopenia, grief    Examination of Body Systems: musculoskeletal, activity/participation and communication    Clinical Presentation: stable    Clinical Decision Making: low    Prognosis: good prognosis    Negative Prognosis Rationale: medical status/condition, chronicity of condition and severity of symptoms.      Therapy Goals      Goals:      1. In 12 weeks the patient will demonstrate independent performance of HEP to maintain functional gains.   2. In 12 weeks the patient will report avg pain < or =4/10  for improved quality of life. (6/10 on evaluation)  3. In 12 weeks the patient will demonstrate ability to ambulate 200 m continuously for improved community ambulation (currenlty reports difficulty walking > 100 ft).   4. In 12 weeks the patient will score a 10 point change on the ODI to demonstrate the MDC (0-100%; lower score indicates a lesser level of disability) and to indicate improved activity tolerance. (25/50 on initial evaluation)  5. In 12 weeks the patient will complete 5 independent sit to stand transfers in <10 seconds to demonstrate increased functional leg strength. (currently 39s for 5 STS with UE assist)        Plan    Therapy options: will be seen for skilled physical therapy services    Planned therapy interventions: Aquatic Therapy, Location manager, Education - Patient, Endurance Activites, Functional Mobility, Gait Training, Home Exercise Program, Manual Therapy, Neuromuscular Re-education, Therapeutic Activities, Therapeutic Exercises, Education - Family/Caregiver, Dry Needling, E-Stim, TENS, Self-Care/Home Training, Civil engineer, contracting and Diaphragmatic/Pursed-lip Breathing    DME Equipment: [employer].    Frequency: 1x week    Duration in weeks: 12    Education provided to: patient.    Education provided: HEP, Treatment options and plan, Symptom management, Safety education, Importance of Therapy, Anatomy, Body mechanics, Role of therapy in Rehabilitation, Posture, Community resources and Body awareness    Education results: verbalized good understanding, demonstrates understanding and needs reinforcement.    Communication/Consultation: N/A.  SUBJECTIVE   Interpreter Use: Not applicable    History of Present Condition      History of Present Condition/Chief Complaint:       -LBP radiating down L LE x 2.5 months, has persisted since having shingles in May   -Constant Back pain; Leg pain intermittent    Date of Onset:  03/23/2022  Date is an approximation?: yes  Explanation:  Back and L LE pain began after patient diagnosed with shingles in May  Subjective:     Sent to PT for back pain and balance deficits.  Reports constant back pain and intermittent leg pain/tingling since having shingles in May.  They found that there is deterioration in my spine. 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  Now also having shortness of breath, decreased stamina and is scheduled for Pulmonary rehab.  Feeling more off balance again.  Walks with the [facility] now, but had progressed to doing some walking without the walker after last bout of PT.  Now having back pain.    Referred for pulmonary rehab.  Back pain worse in the past few months.      Previous treatment- spinal tap seemed to make it worse  Falls- no falls in the past year- last one Aug 2022; has had home modifications to decrease risk for falls  Home-  Lives alone since her husband passed; Daughter Christy Ibarra checks in daily  CLOF- ambulates 100 yards at most at a time; took breaks walking from the parking lot; needs to take breaks due to gait issues, back, & fatigue  Any current exercise?- stretching some, moving arms and legs a couple days week,  No regular walking or aerobic exercise currently- has had difficulty due to heat and air quality    Pain  Current pain rating: 6  At best pain rating: 6  At worst pain rating: 8  Location: 6/10 back and 4-5/10 leg  Quality: aching, sore, numbing, tingling and tightness  Relieving factors: ice and heat (stretching, changing positions)  Aggravating factors: walking, when still, lifting and bending (uses grabber)  Pain Related Behaviors: avoidance (gardening, sits down to vacuum, sits down to mop)  Progression: improved  Red flags: none.    Precautions and Equipment  Precautions: Osteoporosis  Current Braces/Orthoses: None  Equipment Currently Used: Rolling walker, Wheelchair - manual and Single point cane (w/c for long distances; used more after her hip replacement, but  not much now)  Prior Functional Status:     Functional Limitation(s)-balance deficits, limited walking tolerance  Current functional status: limited household activities, limited standing tolerance, limited exercise, limited lifting, unsteady gait, limited recreation, limited walking tolerance and use of assistive device  Social Support  Lives in: [place] house (one step to enter without rail, 1/2 step)  Lives with: alone (daughter, heather checks in daily)  Hand dominance: right  Communication Preference: verbal, written and visual  Barriers to Learning: No Barriers  Work/School: retired    Diagnostic Tests  X-ray: abnormal        Treatments  Current treatment: physical therapy      Patient Goals  Patient goals for therapy: decreased pain, improved ambulation, improved balance, increased strength and independence with ADLs/IADLs (able to do more, get stronger, keep the abilities that I have)      OBJECTIVE     Posture/Observation:   Sitting: slouched seated posture  Standing: stands in [employer] with wide BOS, B knee  flexion, heavy reliance on UE's    Lumbar AROM:  Motion Major loss  (25% ROM) Mod loss  (50% ROM) Min loss  (75% ROM) No loss  100% ROM Symptoms    Flexion- in sitting due to balance deficits  X   Inc back pain and L LE sx   Extension- in standing with one hand on walker and PT assist  X   Inc back pain and L LE sx     Strength/MMT:   LE MMT Left Right   Hip flex:  (L2) 4-/5 5/5   Knee ext:  (L3) 4/5 5/5   Knee flex: (S2) 5/5 5/5   Ankle DF:  (L4) 5/5 5/5   Ankle PF:  (S1) 5/5 5/5   Great toe ext: (L5) 5/5 5/5     Palpation/segmental mobility:  hypertonicity L lumbar paraspinals    Sensation:   Gross sensation screen: Impaired    Reflexes:   NT    Dural Signs:    Slump test:   R and L both make L LE feel better per patient  Supine sciatic nerve stretch- increased tingling    Function:  5xSTS: heavy reliance on L UE (wide chair, sitting towards L) x 39s, difficulty coming to full standing position    Gait:  Ambulates with ROLLING WALKER with heavy reliance on UE's, needs cues to stay close to walker, B knees flexed, walks on toes     Knee ROM:  -30 degrees AROM on R  -20 degrees AROM on L    Modified Oswestry:  25/50= 50%    TREATMENT RENDERED     Evaluation: 30 Mod Complexity  Therapeutic Exercise:  14 Minutes   Performed with direct PT demonstration, instruction, supervision, and guidance.   - Education on condition, prognosis, and PT POC  - SKTC x 10 (B)  - hooklying lumbar rotation x 10 (B)  - supine sciatic n glide x 10 (B)  - supine hip bridge (~ 2 lift) x 10    Discussed shoewear- wear sneakers vs slides to PT; use socks with grippers around the house    HEP Access Code: [email or web address]  Next Visit Plan: assess response to current HEP; progress LE strength/balance, OTAGO based exercises,  manual therapy (STM, rotational mobs)    Total Treatment Time: 44 Minutes  PT Evaluation Charges  $$ PT Evaluation - MOD Complexity [mins]: 30     Therapeutic Interventions Charges  $$ Therapeutic Exercise [mins]: 14                 I attest that I have reviewed the above information.  Signed: Anabel Bene, PT, DPT  06/11/2022 9:50 PM        I reviewed the no-show/attendance policy with the patient and caregiver(s). The patient is aware that they must call to cancel appointments more than 24 hours in advance. They are also aware that if they late cancel or no-show three times, we reserve the right to cancel their remaining appointments. This policy is in place to allow [place] to best serve the needs of our caseload.    If patient returns to clinic with variance in plan of care, then it may be attributable to one or more of the following factors: preferred clinician availability, appointment time request availability, therapy pool appointment availability, major holiday with clinic closure, caregiver availability, patient transportation, conflicting medical appointment, inclement weather, and/or patient illness.    If patient does not return for follow up visit(s) related  to this episode of care, this note will serve as their discharge note from Physical Therapy.

## 2022-06-11 NOTE — Unmapped (Signed)
Christy Ibarra was sent 3 pens of Tymlos which is a 3 month supply.  She still have two pens on hand and does not need a refill.  Will reschedule call appropriately

## 2022-06-15 ENCOUNTER — Ambulatory Visit: Admit: 2022-06-15 | Discharge: 2022-06-16 | Payer: MEDICAID | Attending: Internal Medicine | Primary: Internal Medicine

## 2022-06-15 DIAGNOSIS — Z1211 Encounter for screening for malignant neoplasm of colon: Principal | ICD-10-CM

## 2022-06-15 DIAGNOSIS — B0229 Other postherpetic nervous system involvement: Principal | ICD-10-CM

## 2022-06-15 MED ORDER — GABAPENTIN 100 MG CAPSULE
ORAL_CAPSULE | 3 refills | 0 days | Status: CP
Start: 2022-06-15 — End: ?
  Filled 2022-06-22: qty 120, 30d supply, fill #0

## 2022-06-15 NOTE — Unmapped (Signed)
Please go to your pharmacy for the Shingrix vaccine. Once you receive the vaccine, ask your pharmacy to send the information to [place] so we can update your record  [facility] Fax: [phone].    Common Questions:  What is shingles?  Shingles is a painful, blistering rash caused by the same virus that causes chicken pox. Shingles can cause permanent nerve pain and damage. Shingles occurs when hidden chicken pox virus in your body comes back. You are more likely to get shingles as you get older. Shingles cannot be passed from one person to another. However, someone with shingles can spread chicken pox.   Why should you get [facility]?  SHINGRIX is very effective at preventing shingles and the nerve pain associated with the shingles.   Who should get [facility]?  Everyone 50 and older should receive SHINGRIX, even if you have already had shingles or received ZOSTAVAX before. [facility] is not recommended for immunocompromised patients or those with a history of severe hypersensitivity to [facility] in the past.  When to get [facility]?  SHINGRIX is two shots given 2-6 months apart. You should get it if you are over 50, even if you have already had ZOSTAVAX (another shingles shot).  Where can I get [facility]?  Many pharmacies have [facility] and can give the shot including [facility], [employer], [address], [employer], [employer], [place], [place] and many others.   How much does SHINGRIX cost?  SHINGRIX may be covered by your insurance. Call your pharmacy to find out if [facility] is covered under your insurance and how much you will have to pay.  What are the side effects of SHINGRIX?  The most common side effects to [facility] are pain, redness or swelling at the injection site. Some patients also report fevers, chills or tiredness. Side effects last for 1-2 days, plan to receive the vaccine at a time that is convenient for you.    Want to know more about shingles or SHINGRIX?  Ask your pharmacist if you have more questions. If you want to know more, this website also has good information : [email or web address].[email or web address].cy

## 2022-06-15 NOTE — Unmapped (Signed)
Internal Medicine Clinic Visit    Reason for Visit: Follow up     A/P:    1. Chronic midline low back pain with bilateral sciatica  Ongoing for years. H/o hip fracture in 2022. She is going to PT, and has only had the entry visit. She has future visits scheduled.  - Naproxen 250 mg BID  - Consider switching Celexa to SNRI in future (of note, did not tolerate Cymbalta in the past)    2. Herpes Zoster - Subacute Herpetic Neuralgia  Seen in May 2023 and diagnosed with herpes zoster to left posterior thigh. Lesions healed but still having numbness to the area. Improving with Gabapentin 100mg  TID.   - 100mg , 100mg , 200mg . Increase night dose due to worse symptoms at night.     2. Chronic pain of both knees  Not bothersome to her at the moment, states she thinks the shingles exacerbated it.   Knees are okay, not using voltaren gel right now    3. Bilateral Hand Weakness  MRI cervical spine in 2023 with disc bulge at C3-C4, C4-C5, C5-C6, and C6-C7. This may be due to radiculopathy. She reports weakness in both hands that presents mainly as difficulty opening jars. Doing exercises at home    4. Esophageal dysphagia  Referred to GI, appointment in September 2023. EGD without explanation for dysphagia.  - Continue Omeprazole 20mg     Health Maintenance  - FIT test given today  - Shingrix in the future - has recovered from acute herpes zoster     Return in about 2 months (around 08/16/2022).      I personally spent  minutes face-to-face and non-face-to-face in the care of this patient, which includes all pre, intra, and post visit time on the date of service.  __________________________________________________________    HPI:    66 year old with a history of Sjogren's syndrome, anxiety/depression/PTSD, leukopenia, gait instability, osteoporosis, bronchiectasis and asthma/COPD overlap who presents today for follow up.     Still having left posterior thigh numbness after herpes zoster infection in May 2023. Reports it is improving and the Gabapentin is helping.     Bronchiectasis: Seeing Pulmonology 7/26. Using Symbicort as prescribed, albuterol using 2x a week PRN symptoms. Started Xolair, first dose given in April 2023, no adverse effects, less nasal congestion, breathing better. Less wheezing.     Engaging in PT for her back and gait- just started and has not had a full appointment yet.     Had LP done with Neurology. Results normal.     Seeing [facility] 8/24. Walking normal for her. Not worsening. Uses walker to get around.     Seeing Endocrinology in October.           __________________________________________________________    Problem List:  Patient Active Problem List   Diagnosis    Abnormal gait    Anxiety    Arthralgia of hip    Family history of genetic disorder    Family history of malignant neoplasm of breast    Family history of malignant neoplasm of cervix    Mixed anxiety depressive disorder    Scoliosis    Sjogren's syndrome with keratoconjunctivitis sicca (CMS-HCC)    Leukopenia    Age-related nuclear cataract of both eyes    Poor sleep    Grief    Osteoporosis with current pathological fracture    Bronchiectasis without complication (CMS-HCC)    Asthma-COPD overlap syndrome (CMS-HCC)    Esophageal dysphagia  Medications:  Reviewed in EPIC  __________________________________________________________    Physical Exam:   Vital Signs:  Vitals:    06/15/22 1425   BP: 136/61   BP Site: L Arm   BP Position: Sitting   BP Cuff Size: Medium   Pulse: 71   Resp: (P) 17   Temp: (P) 36.3 ??C (97.4 ??F)   TempSrc: (P) Temporal   SpO2: (P) 97%   Weight: 60.7 kg (133 lb 12.8 oz)   Height: 177.8 cm (5' 10)          Body mass index is 19.2 kg/m??.    Gen: Appears at baseline  Cardio: Regular rate and rhythm  Pulm: Clear to all bases, no increased work of breathing  Ext: No LE edema  Neuro: Alert and oriented to situation.

## 2022-06-16 NOTE — Unmapped (Signed)
I saw and evaluated the patient, participating in the key portions of the service.  I reviewed the resident’s note.  I agree with the resident’s findings and plan. Katie Shanira Tine, MD

## 2022-06-16 NOTE — Unmapped (Signed)
Addended by: Wyline Beady on: 06/16/2022 03:04 PM     Modules accepted: Level of Service

## 2022-06-17 ENCOUNTER — Ambulatory Visit: Admit: 2022-06-17 | Discharge: 2022-06-17 | Payer: MEDICAID

## 2022-06-17 DIAGNOSIS — J42 Unspecified chronic bronchitis: Principal | ICD-10-CM

## 2022-06-17 DIAGNOSIS — Z87891 Personal history of nicotine dependence: Principal | ICD-10-CM

## 2022-06-17 DIAGNOSIS — M3501 Sicca syndrome with keratoconjunctivitis: Principal | ICD-10-CM

## 2022-06-17 DIAGNOSIS — J449 Chronic obstructive pulmonary disease, unspecified: Principal | ICD-10-CM

## 2022-06-17 DIAGNOSIS — J479 Bronchiectasis, uncomplicated: Principal | ICD-10-CM

## 2022-06-17 NOTE — Unmapped (Signed)
Addended by: Hassell Done on: 06/17/2022 12:31 PM     Modules accepted: Level of Service
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
## 2022-06-22 MED FILL — OMEPRAZOLE 20 MG CAPSULE,DELAYED RELEASE: ORAL | 90 days supply | Qty: 90 | Fill #1

## 2022-06-22 NOTE — Unmapped (Signed)
[facility] [place]  OUTPATIENT PHYSICAL THERAPY  06/24/2022  Note Type: Treatment Note       Patient Name: Christy Ibarra  Date of Birth:31-Mar-1956  Diagnosis:   Encounter Diagnoses   Name Primary?    Chronic midline low back pain with bilateral sciatica Yes    Sjogren's syndrome with keratoconjunctivitis sicca (CMS-HCC)     Risk for falls      Referring Provider: Johnsie Cancel    Date of Onset of Impairment: No date available  Date PT Care Plan Established or Reviewed: No date available  Date PT Treatment Started: No date available     Plan of Care Effective Date: 06/09/22 to 09/09/22  Session Number:  2    ASSESSMENT & PLAN   Assessment  Assessment details:    Christy Ibarra is a pleasant 66 y.o. female who presents for Physical Therapy follow-up with LBP/radicular pain.  She also presents with impaired balance, gait impairment, and increased risk for falls.  Her knee extension ROM improved with manual intervention and she reported decreased nerve pain as well secondary to gentle stretch with manual. She was able to complete sit to stands today without use of UE. Her baseline for is low with use of rolling walker. She will benefit from skilled Physical Therapy intervention to address the moderate impairments listed below and to assist the patient in maximizing her functional independence and safe return to prior level of function.             Impairments: decreased endurance, pain, decreased strength, decreased range of motion and impaired motor control      Personal Factors/Comorbidities: 3+    Specific Comorbidities: Bronchiectasis, Asthma-COPD overlap syndrome, scoliosis, osteoporosis, sjogren's, abnormal gait, anxiety, leukopenia, grief    Examination of Body Systems: musculoskeletal, activity/participation and communication    Clinical Presentation: stable    Clinical Decision Making: low    Prognosis: good prognosis    Negative Prognosis Rationale: medical status/condition, chronicity of condition and severity of symptoms.      Therapy Goals      Goals:      1. In 12 weeks the patient will demonstrate independent performance of HEP to maintain functional gains.   2. In 12 weeks the patient will report avg pain < or =4/10  for improved quality of life. (6/10 on evaluation)  3. In 12 weeks the patient will demonstrate ability to ambulate 200 m continuously for improved community ambulation (currenlty reports difficulty walking > 100 ft).   4. In 12 weeks the patient will score a 10 point change on the ODI to demonstrate the MDC (0-100%; lower score indicates a lesser level of disability) and to indicate improved activity tolerance. (25/50 on initial evaluation)  5. In 12 weeks the patient will complete 5 independent sit to stand transfers in <10 seconds to demonstrate increased functional leg strength. (currently 39s for 5 STS with UE assist)        Plan    Therapy options: will be seen for skilled physical therapy services    Planned therapy interventions: Aquatic Therapy, Location manager, Education - Patient, Endurance Activites, Functional Mobility, Gait Training, Home Exercise Program, Manual Therapy, Neuromuscular Re-education, Therapeutic Activities, Therapeutic Exercises, Education - Family/Caregiver, Dry Needling, E-Stim, TENS, Self-Care/Home Training, Civil engineer, contracting and Diaphragmatic/Pursed-lip Breathing    DME Equipment: [employer].    Frequency: 1x week    Duration in weeks: 12    Education provided to: patient.    Education provided:  HEP, Treatment options and plan, Symptom management, Safety education, Importance of Therapy, Anatomy, Body mechanics, Role of therapy in Rehabilitation, Posture, Community resources and Body awareness    Education results: verbalized good understanding, demonstrates understanding and needs reinforcement.    Communication/Consultation: N/A.            SUBJECTIVE   Interpreter Use: Not applicable    History of Present Condition      History of Present Condition/Chief Complaint:       -LBP radiating down L LE x 2.5 months, has persisted since having shingles in May   -Constant Back pain; Leg pain intermittent    Sent to PT for back pain and balance deficits.  Reports constant back pain and intermittent leg pain/tingling since having shingles in May.  They found that there is deterioration in my spine. 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  Now also having shortness of breath, decreased stamina and is scheduled for Pulmonary rehab.  Feeling more off balance again.  Walks with the [facility] now, but had progressed to doing some walking without the walker after last bout of PT.  Now having back pain.    Referred for pulmonary rehab.  Back pain worse in the past few months.      Previous treatment- spinal tap seemed to make it worse  Falls- no falls in the past year- last one Aug 2022; has had home modifications to decrease risk for falls  Home-  Lives alone since her husband passed; Daughter [name] checks in daily  CLOF- ambulates 100 yards at most at a time; took breaks walking from the parking lot; needs to take breaks due to gait issues, back, & fatigue  Any current exercise?- stretching some, moving arms and legs a couple days week,  No regular walking or aerobic exercise currently- has had difficulty due to heat and air quality    Date of Onset:  03/23/2022  Date is an approximation?: yes  Explanation:  Back and L LE pain began after patient diagnosed with shingles in May  Subjective:     Reports nerve pain and back pain. Reports taking her awhile to get used to doing the exercises. Reports pain is 5/10. Walking makes it worse. Putting on shoes is a bit hard. Wants to be more mobile and get back to the gym.      Pain  Current pain rating: 5  At best pain rating: 6  At worst pain rating: 8  Location: 6/10 back and 4-5/10 leg  Quality: aching, sore, numbing, tingling and tightness  Relieving factors: ice and heat (stretching, changing positions)  Aggravating factors: walking, when still, lifting and bending (uses grabber)  Pain Related Behaviors: avoidance (gardening, sits down to vacuum, sits down to mop)  Progression: improved  Red flags: none.    Precautions and Equipment  Precautions: Osteoporosis  Current Braces/Orthoses: None  Equipment Currently Used: Rolling walker, Wheelchair - manual and Single point cane (w/c for long distances; used more after her hip replacement, but  not much now)  Prior Functional Status:     Functional Limitation(s)-balance deficits, limited walking tolerance  Current functional status: limited household activities, limited standing tolerance, limited exercise, limited lifting, unsteady gait, limited recreation, limited walking tolerance and use of assistive device  Social Support  Lives in: [place] house (one step to enter without rail, 1/2 step)  Lives with: alone (daughter, heather checks in daily)  Higher education careers adviser  dominance: right  Communication Preference: verbal, written and visual  Barriers to Learning: No Barriers  Work/School: retired    Diagnostic Tests  X-ray: abnormal        Treatments  Current treatment: physical therapy      Patient Goals  Patient goals for therapy: decreased pain, improved ambulation, improved balance, increased strength and independence with ADLs/IADLs (able to do more, get stronger, keep the abilities that I have)    OBJECTIVE     Posture/Observation:   Sitting: slouched seated posture  Standing: stands in [employer] with wide BOS, B knee flexion, heavy reliance on UE's    Function:  5xSTS: heavy reliance on L UE (wide chair, sitting towards L) x 39s, difficulty coming to full standing position  2 MWT: 134 feet with rolling walker    Gait:  Ambulates with ROLLING WALKER with heavy reliance on UE's, needs cues to stay close to walker, B knees flexed, walks on toes     Knee ROM:  Lacking 26 degrees extension AROM on R  Lacking 10 degrees extension AROM on L    Modified Oswestry:  25/50= 50%    TREATMENT RENDERED   Manual: 10 Minutes   - AP glides femur on tibia to improve knee extension grade III x5 min each leg  - pt reports slight discomfort from increased sciatic nerve stretch    Gained minimal degrees ROM on L (lacking 8 after manual)   Gained significant degrees ROM on R (lacking 12 after manual)    Therapeutic Exercise:  35 Minutes   Performed with direct PT demonstration, instruction, supervision, and guidance.   - Education on condition, prognosis, and PT POC  - Nustep x8 minutes seat 9, arms 9  - Seated knee extension with self overpressure by arms x15 B   - Sit to stand from elevated mat table 3x5 - slowly lowered table a bit; no UE use; favors R leg   - 2 MWT   - Reviewed HEP     Discussed shoewear- wear sneakers vs slides to PT; use socks with grippers around the house    HEP Access Code: [id number]  Next Visit Plan: assess response to current HEP; progress LE strength/balance, OTAGO based exercises,  manual therapy (STM, rotational mobs)    Total Treatment Time: 45 Minutes        Therapeutic Interventions Charges  $$ Therapeutic Exercise [mins]: 35  $$ Manual Therapy [mins]: 10                 I attest that I have reviewed the above information.  Signed: Fidela Salisbury, PT, DPT  06/24/2022 10:31 AM        I reviewed the no-show/attendance policy with the patient and caregiver(s). The patient is aware that they must call to cancel appointments more than 24 hours in advance. They are also aware that if they late cancel or no-show three times, we reserve the right to cancel their remaining appointments. This policy is in place to allow [place] to best serve the needs of our caseload.    If patient returns to clinic with variance in plan of care, then it may be attributable to one or more of the following factors: preferred clinician availability, appointment time request availability, therapy pool appointment availability, major holiday with clinic closure, caregiver availability, patient transportation, conflicting medical appointment, inclement weather, and/or patient illness.    If patient does not return for follow up visit(s) related to this episode of care, this  note will serve as their discharge note from Physical Therapy.

## 2022-06-23 NOTE — Unmapped (Signed)
[facility] Administered Medication Refill Coordination Note      NAME:Christy Ibarra DOB: 1956-07-11      Medication: Xolair    Day Supply: 28 days      SHIPPING      Next delivery from [facility] [phone]) to  [facility]  for Christy Ibarra is scheduled for 08/04    Clinic contact: Kris Mouton    Patient's next nurse visit for administration: 08/07.    We will follow up with clinic monthly for standard refill processing and delivery.      Javoni Lucken Samella Parr  Specialty Pharmacy Technician

## 2022-06-25 MED FILL — XOLAIR 150 MG/ML SUBCUTANEOUS SYRINGE: SUBCUTANEOUS | 28 days supply | Qty: 2 | Fill #3

## 2022-06-27 ENCOUNTER — Ambulatory Visit: Admit: 2022-06-27 | Discharge: 2022-06-28 | Payer: MEDICAID

## 2022-06-27 DIAGNOSIS — Z87891 Personal history of nicotine dependence: Principal | ICD-10-CM

## 2022-06-29 ENCOUNTER — Institutional Professional Consult (permissible substitution): Admit: 2022-06-29 | Discharge: 2022-06-30 | Payer: MEDICAID

## 2022-06-29 NOTE — Unmapped (Signed)
Nursing Assessment completed.   Drug allergies assessed.     Med supplied:  Patient pharmacy supply      Drug Administered: Xolair 300mg    Route: SubQ  Frequency: 28 days     Right arm: 150 mg  Lot #: [id number] / Exp 05/2023    Left arm: 150 mg  Lot #: [id number] / Exp 05/2023    Assessment:  Tolerated procedure well, no side effects. Patient has epi pen.   Patient aware to contact office if side effects occur.     RTC: 29 days for dose #3    Delice Lesch, RN

## 2022-06-29 NOTE — Unmapped (Unsigned)
[facility] PT [facility] [place]  OUTPATIENT PHYSICAL THERAPY  07/01/2022          Patient Name: Christy Ibarra  Date of Birth:17-Jan-1956  Diagnosis:   No diagnosis found.    Referring Provider: Johnsie Cancel    Date of Onset of Impairment: No date available  Date PT Care Plan Established or Reviewed: No date available  Date PT Treatment Started: No date available     Plan of Care Effective Date: 06/09/22 to 09/09/22  Session Number:  3    ASSESSMENT & PLAN   Assessment  Assessment details:    Christy Ibarra is a pleasant 66 y.o. female who presents for Physical Therapy follow-up with LBP/radicular pain.  She also presents with impaired balance, gait impairment, and increased risk for falls.  Her knee extension ROM improved with manual intervention and she reported decreased nerve pain as well secondary to gentle stretch with manual. She was able to complete sit to stands today without use of UE. Her baseline for is low with use of rolling walker. She will benefit from skilled Physical Therapy intervention to address the moderate impairments listed below and to assist the patient in maximizing her functional independence and safe return to prior level of function.             Impairments: decreased endurance, pain, decreased strength, decreased range of motion and impaired motor control      Personal Factors/Comorbidities: 3+    Specific Comorbidities: Bronchiectasis, Asthma-COPD overlap syndrome, scoliosis, osteoporosis, sjogren's, abnormal gait, anxiety, leukopenia, grief    Examination of Body Systems: musculoskeletal, activity/participation and communication    Clinical Presentation: stable    Clinical Decision Making: low    Prognosis: good prognosis    Negative Prognosis Rationale: medical status/condition, chronicity of condition and severity of symptoms.      Therapy Goals      Goals:      1. In 12 weeks the patient will demonstrate independent performance of HEP to maintain functional gains.   2. In 12 weeks the patient will report avg pain < or =4/10  for improved quality of life. (6/10 on evaluation)  3. In 12 weeks the patient will demonstrate ability to ambulate 200 m continuously for improved community ambulation (currenlty reports difficulty walking > 100 ft).   4. In 12 weeks the patient will score a 10 point change on the ODI to demonstrate the MDC (0-100%; lower score indicates a lesser level of disability) and to indicate improved activity tolerance. (25/50 on initial evaluation)  5. In 12 weeks the patient will complete 5 independent sit to stand transfers in <10 seconds to demonstrate increased functional leg strength. (currently 39s for 5 STS with UE assist)        Plan    Therapy options: will be seen for skilled physical therapy services    Planned therapy interventions: Aquatic Therapy, Location manager, Education - Patient, Endurance Activites, Functional Mobility, Gait Training, Home Exercise Program, Manual Therapy, Neuromuscular Re-education, Therapeutic Activities, Therapeutic Exercises, Education - Family/Caregiver, Dry Needling, E-Stim, TENS, Self-Care/Home Training, Civil engineer, contracting and Diaphragmatic/Pursed-lip Breathing    DME Equipment: [employer].    Frequency: 1x week    Duration in weeks: 12    Education provided to: patient.    Education provided: HEP, Treatment options and plan, Symptom management, Safety education, Importance of Therapy, Anatomy, Body mechanics, Role of therapy in Rehabilitation, Posture, Community resources and Body awareness    Education results: verbalized good understanding, demonstrates  understanding and needs reinforcement.    Communication/Consultation: N/A.              SUBJECTIVE   Interpreter Use: Not applicable    History of Present Condition      History of Present Condition/Chief Complaint:       -LBP radiating down L LE x 2.5 months, has persisted since having shingles in May   -Constant Back pain; Leg pain intermittent    Sent to PT for back pain and balance deficits.  Reports constant back pain and intermittent leg pain/tingling since having shingles in May.  They found that there is deterioration in my spine. 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  Now also having shortness of breath, decreased stamina and is scheduled for Pulmonary rehab.  Feeling more off balance again.  Walks with the [facility] now, but had progressed to doing some walking without the walker after last bout of PT.  Now having back pain.    Referred for pulmonary rehab.  Back pain worse in the past few months.      Previous treatment- spinal tap seemed to make it worse  Falls- no falls in the past year- last one Aug 2022; has had home modifications to decrease risk for falls  Home-  Lives alone since her husband passed; Daughter Christy Ibarra checks in daily  CLOF- ambulates 100 yards at most at a time; took breaks walking from the parking lot; needs to take breaks due to gait issues, back, & fatigue  Any current exercise?- stretching some, moving arms and legs a couple days week,  No regular walking or aerobic exercise currently- has had difficulty due to heat and air quality    Date of Onset:  03/23/2022  Date is an approximation?: yes  Explanation:  Back and L LE pain began after patient diagnosed with shingles in May  Subjective:     Reports nerve pain and back pain. Reports taking her awhile to get used to doing the exercises. Reports pain is 5/10. Walking makes it worse. Putting on shoes is a bit hard. Wants to be more mobile and get back to the gym.      Pain  Current pain rating: 5  At best pain rating: 6  At worst pain rating: 8  Location: 6/10 back and 4-5/10 leg  Quality: aching, sore, numbing, tingling and tightness  Relieving factors: ice and heat (stretching, changing positions)  Aggravating factors: walking, when still, lifting and bending (uses grabber)  Pain Related Behaviors: avoidance (gardening, sits down to vacuum, sits down to mop)  Progression: improved  Red flags: none.    Precautions and Equipment  Precautions: Osteoporosis  Current Braces/Orthoses: None  Equipment Currently Used: Rolling walker, Wheelchair - manual and Single point cane (w/c for long distances; used more after her hip replacement, but  not much now)  Prior Functional Status:     Functional Limitation(s)-balance deficits, limited walking tolerance  Current functional status: limited household activities, limited standing tolerance, limited exercise, limited lifting, unsteady gait, limited recreation, limited walking tolerance and use of assistive device  Social Support  Lives in: [place] house (one step to enter without rail, 1/2 step)  Lives with: alone (daughter, heather checks in daily)  Hand dominance: right  Communication Preference: verbal, written and visual  Barriers to Learning: No Barriers  Work/School: retired    Diagnostic Tests  X-ray: abnormal  Treatments  Current treatment: physical therapy      Patient Goals  Patient goals for therapy: decreased pain, improved ambulation, improved balance, increased strength and independence with ADLs/IADLs (able to do more, get stronger, keep the abilities that I have)      OBJECTIVE     Posture/Observation:   Sitting: slouched seated posture  Standing: stands in [employer] with wide BOS, B knee flexion, heavy reliance on UE's    Function:  5xSTS: heavy reliance on L UE (wide chair, sitting towards L) x 39s, difficulty coming to full standing position  2 MWT: 134 feet with rolling walker    Gait:  Ambulates with ROLLING WALKER with heavy reliance on UE's, needs cues to stay close to walker, B knees flexed, walks on toes     Knee ROM:  Lacking 26 degrees extension AROM on R  Lacking 10 degrees extension AROM on L    Modified Oswestry:  25/50= 50%    TREATMENT RENDERED   Manual: 10 Minutes   - AP glides femur on tibia to improve knee extension grade III x5 min each leg  - pt reports slight discomfort from increased sciatic nerve stretch    Gained minimal degrees ROM on L (lacking 8 after manual)   Gained significant degrees ROM on R (lacking 12 after manual)    Therapeutic Exercise:  35 Minutes   Performed with direct PT demonstration, instruction, supervision, and guidance.   - Education on condition, prognosis, and PT POC  - Nustep x8 minutes seat 9, arms 9  - Seated knee extension with self overpressure by arms x15 B   - Sit to stand from elevated mat table 3x5 - slowly lowered table a bit; no UE use; favors R leg   - 2 MWT   - Reviewed HEP     Discussed shoewear- wear sneakers vs slides to PT; use socks with grippers around the house    HEP Access Code: [id number]  Next Visit Plan: assess response to current HEP; progress LE strength/balance, OTAGO based exercises,  manual therapy (STM, rotational mobs)    Total Treatment Time: 45 Minutes                          I attest that I have reviewed the above information.  Signed: Fidela Salisbury, PT, DPT  06/29/2022 12:46 PM        I reviewed the no-show/attendance policy with the patient and caregiver(s). The patient is aware that they must call to cancel appointments more than 24 hours in advance. They are also aware that if they late cancel or no-show three times, we reserve the right to cancel their remaining appointments. This policy is in place to allow [place] to best serve the needs of our caseload.    If patient returns to clinic with variance in plan of care, then it may be attributable to one or more of the following factors: preferred clinician availability, appointment time request availability, therapy pool appointment availability, major holiday with clinic closure, caregiver availability, patient transportation, conflicting medical appointment, inclement weather, and/or patient illness.    If patient does not return for follow up visit(s) related to this episode of care, this note will serve as their discharge note from Physical Therapy.

## 2022-06-30 NOTE — Unmapped (Signed)
Patient states she still has enough Tymlos to get through September.  She also asked how to get Xolair sent to her home for her October dose (after her first 3 dose in clinic). I rescheduled her call for the last week of September to discuss Tymlos and Xolair.    Regis Bill, PharmD, [facility]  [facility]  [address]  [place], [place] [address]  ph: [phone]  f: [phone]

## 2022-07-05 DIAGNOSIS — J449 Chronic obstructive pulmonary disease, unspecified: Principal | ICD-10-CM

## 2022-07-05 MED ORDER — IPRATROPIUM BROMIDE 0.02 % SOLUTION FOR INHALATION
Freq: Four times a day (QID) | RESPIRATORY_TRACT | 12 refills | 7 days
Start: 2022-07-05 — End: 2023-07-05

## 2022-07-06 MED ORDER — IPRATROPIUM BROMIDE 0.02 % SOLUTION FOR INHALATION
Freq: Four times a day (QID) | RESPIRATORY_TRACT | 12 refills | 7 days | Status: CP
Start: 2022-07-06 — End: 2023-07-06
  Filled 2022-07-07: qty 62.5, 7d supply, fill #0

## 2022-07-06 NOTE — Unmapped (Signed)
[facility] PT [facility] [place] [place]  OUTPATIENT PHYSICAL THERAPY  07/08/2022  Note Type: Treatment Note       Patient Name: Christy Ibarra  Date of Birth:1956/02/07  Diagnosis:   Encounter Diagnoses   Name Primary?    Chronic midline low back pain with bilateral sciatica Yes    Sjogren's syndrome with keratoconjunctivitis sicca (CMS-HCC)     Risk for falls      Referring Provider: Johnsie Ibarra    Date of Onset of Impairment: No date available  Date PT Care Plan Established or Reviewed: No date available  Date PT Treatment Started: No date available     Plan of Care Effective Date: 06/09/22 to 09/09/22  Session Number:  3 (1/8 covered 07/08/22-09/01/22)    ASSESSMENT & PLAN   Assessment  Assessment details:    Christy Ibarra is a pleasant 66 y.o. female who presents for Physical Therapy follow-up with LBP/radicular pain. She also presents with impaired balance, gait impairment, and increased risk for falls. Tolerated LE strengthening well today with some discomfort when sciatic nerve was stretched. She continues to be limited in ability to ambulate safely with front wheeled walker and complete tight turns. She will benefit from skilled Physical Therapy intervention to address the moderate impairments listed below and to assist the patient in maximizing her functional independence and safe return to prior level of function.             Impairments: decreased endurance, pain, decreased strength, decreased range of motion and impaired motor control      Personal Factors/Comorbidities: 3+    Specific Comorbidities: Bronchiectasis, Asthma-COPD overlap syndrome, scoliosis, osteoporosis, sjogren's, abnormal gait, anxiety, leukopenia, grief    Examination of Body Systems: musculoskeletal, activity/participation and communication    Clinical Presentation: stable    Clinical Decision Making: low    Prognosis: good prognosis    Negative Prognosis Rationale: medical status/condition, chronicity of condition and severity of symptoms.      Therapy Goals      Goals:      1. In 12 weeks the patient will demonstrate independent performance of HEP to maintain functional gains.   2. In 12 weeks the patient will report avg pain < or =4/10  for improved quality of life. (6/10 on evaluation)  3. In 12 weeks the patient will demonstrate ability to ambulate 200 m continuously for improved community ambulation (currenlty reports difficulty walking > 100 ft).   4. In 12 weeks the patient will score a 10 point change on the ODI to demonstrate the MDC (0-100%; lower score indicates a lesser level of disability) and to indicate improved activity tolerance. (25/50 on initial evaluation)  5. In 12 weeks the patient will complete 5 independent sit to stand transfers in <10 seconds to demonstrate increased functional leg strength. (currently 39s for 5 STS with UE assist)        Plan    Therapy options: will be seen for skilled physical therapy services    Planned therapy interventions: Aquatic Therapy, Location manager, Education - Patient, Endurance Activites, Functional Mobility, Gait Training, Home Exercise Program, Manual Therapy, Neuromuscular Re-education, Therapeutic Activities, Therapeutic Exercises, Education - Family/Caregiver, Dry Needling, E-Stim, TENS, Self-Care/Home Training, Civil engineer, contracting and Diaphragmatic/Pursed-lip Breathing    DME Equipment: [employer].    Frequency: 1x week    Duration in weeks: 12    Education provided to: patient.    Education provided: HEP, Treatment options and plan, Symptom management, Safety education, Importance of Therapy, Anatomy, Body  mechanics, Role of therapy in Rehabilitation, Posture, Community resources and Body awareness    Education results: verbalized good understanding, demonstrates understanding and needs reinforcement.    Communication/Consultation: N/A.              SUBJECTIVE   Interpreter Use: Not applicable    History of Present Condition      History of Present Condition/Chief Complaint:       -LBP radiating down L LE x 2.5 months, has persisted since having shingles in May   -Constant Back pain; Leg pain intermittent    Sent to PT for back pain and balance deficits.  Reports constant back pain and intermittent leg pain/tingling since having shingles in May.  They found that there is deterioration in my spine. 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  Now also having shortness of breath, decreased stamina and is scheduled for Pulmonary rehab.  Feeling more off balance again.  Walks with the [facility] now, but had progressed to doing some walking without the walker after last bout of PT.  Now having back pain.    Referred for pulmonary rehab.  Back pain worse in the past few months.      Previous treatment- spinal tap seemed to make it worse  Falls- no falls in the past year- last one Aug 2022; has had home modifications to decrease risk for falls  Home-  Lives alone since her husband passed; Daughter Christy Ibarra checks in daily  CLOF- ambulates 100 yards at most at a time; took breaks walking from the parking lot; needs to take breaks due to gait issues, back, & fatigue  Any current exercise?- stretching some, moving arms and legs a couple days week,  No regular walking or aerobic exercise currently- has had difficulty due to heat and air quality    Date of Onset:  03/23/2022  Date is an approximation?: yes  Explanation:  Back and L LE pain began after patient diagnosed with shingles in May  Subjective:     Reports doing well. Left foot has neuropathy and has noticed more swelling on L foot/ankle. Reports always has had pain in calf. Swelling worse at night. Has tried icing it with no help. Hx venous insufficiency.       Pain  Current pain rating: 5  At best pain rating: 6  At worst pain rating: 8  Location: 6/10 back and 4-5/10 leg  Quality: aching, sore, numbing, tingling and tightness  Relieving factors: ice and heat (stretching, changing positions)  Aggravating factors: walking, when still, lifting and bending (uses grabber)  Pain Related Behaviors: avoidance (gardening, sits down to vacuum, sits down to mop)  Progression: improved  Red flags: none.    Precautions and Equipment  Precautions: Osteoporosis  Current Braces/Orthoses: None  Equipment Currently Used: Rolling walker, Wheelchair - manual and Single point cane (w/c for long distances; used more after her hip replacement, but  not much now)  Prior Functional Status:     Functional Limitation(s)-balance deficits, limited walking tolerance  Current functional status: limited household activities, limited standing tolerance, limited exercise, limited lifting, unsteady gait, limited recreation, limited walking tolerance and use of assistive device  Social Support  Lives in: [place] house (one step to enter without rail, 1/2 step)  Lives with: alone (daughter, heather checks in daily)  Hand dominance: right  Communication Preference: verbal, written and visual  Barriers to Learning: No Barriers  Work/School: retired  Diagnostic Tests  X-ray: abnormal        Treatments  Current treatment: physical therapy      Patient Goals  Patient goals for therapy: decreased pain, improved ambulation, improved balance, increased strength and independence with ADLs/IADLs (able to do more, get stronger, keep the abilities that I have)      OBJECTIVE     Posture/Observation:   Sitting: slouched seated posture  Standing: stands in [employer] with wide BOS, B knee flexion, heavy reliance on UE's    Function:  5xSTS: heavy reliance on L UE (wide chair, sitting towards L) x 39s, difficulty coming to full standing position  2 MWT: 134 feet with rolling walker    Gait:  Ambulates with ROLLING WALKER with heavy reliance on UE's, needs cues to stay close to walker, B knees flexed, walks on toes     Knee ROM:  Lacking 26 degrees extension AROM on R  Lacking 10 degrees extension AROM on L    Modified Oswestry:  25/50= 50%    Well's DVT Criteria: negative on each component, scored 0, DVT unlikely    TREATMENT RENDERED     Therapeutic Exercise:  45 Minutes   Performed with direct PT demonstration, instruction, supervision, and guidance.   - Education on condition, prognosis, and PT POC  - Nustep x5 minutes seat 9, arms 9, lvl 2 - increase resistance next session   - Standing calf raises 2x15   - LAQ with 2.5# ankle weights 2x12 - pt reports this is harder on her left   - DVT screen - DVT unlikely per Well's DVT screening criteria  - Manual tibial nerve flossing x20 - decreased pt burning pain in left heel  - Standing hamstring curls with 2.5#  ankle weights 2x12  - Standing hip abduction with 2.5# ankle weights 2x12  - Supported mini squats 2x8    HEP Access Code: [id number]  Next Visit Plan: sidelying neutral gapping for pain control; assess response to current HEP; progress LE strength/balance, OTAGO based exercises,  manual therapy (STM, rotational mobs)    Total Treatment Time: 45 Minutes        Therapeutic Interventions Charges  $$ Therapeutic Exercise [mins]: 45                 I attest that I have reviewed the above information.  Signed: Fidela Salisbury, PT, DPT  07/08/2022 10:27 AM        I reviewed the no-show/attendance policy with the patient and caregiver(s). The patient is aware that they must call to Ibarra appointments more than 24 hours in advance. They are also aware that if they late Ibarra or no-show three times, we reserve the right to Ibarra their remaining appointments. This policy is in place to allow [place] to best serve the needs of our caseload.    If patient returns to clinic with variance in plan of care, then it may be attributable to one or more of the following factors: preferred clinician availability, appointment time request availability, therapy pool appointment availability, major holiday with clinic closure, caregiver availability, patient transportation, conflicting medical appointment, inclement weather, and/or patient illness.    If patient does not return for follow up visit(s) related to this episode of care, this note will serve as their discharge note from Physical Therapy.

## 2022-07-07 MED FILL — SYMBICORT 160 MCG-4.5 MCG/ACTUATION HFA AEROSOL INHALER: RESPIRATORY_TRACT | 30 days supply | Qty: 10.2 | Fill #2

## 2022-07-07 MED FILL — MONTELUKAST 10 MG TABLET: ORAL | 90 days supply | Qty: 90 | Fill #2

## 2022-07-07 MED FILL — BD ULTRA-FINE MINI PEN NEEDLE 31 GAUGE X 3/16" (5 MM): 90 days supply | Qty: 100 | Fill #1

## 2022-07-07 MED FILL — CITALOPRAM 40 MG TABLET: ORAL | 90 days supply | Qty: 90 | Fill #1

## 2022-07-07 MED FILL — TRAZODONE 50 MG TABLET: ORAL | 90 days supply | Qty: 45 | Fill #0

## 2022-07-07 MED FILL — HYDROXYCHLOROQUINE 200 MG TABLET: ORAL | 90 days supply | Qty: 90 | Fill #1

## 2022-07-13 NOTE — Unmapped (Signed)
[facility] PT [facility] [place]  OUTPATIENT PHYSICAL THERAPY  07/15/2022          Patient Name: Christy Ibarra  Date of Birth:1956/05/28  Diagnosis:   No diagnosis found.    Referring Provider: Johnsie Cancel    Date of Onset of Impairment: No date available  Date PT Care Plan Established or Reviewed: No date available  Date PT Treatment Started: No date available     Plan of Care Effective Date: 06/09/22 to 09/09/22  Session Number:  4 (2/8 covered 07/08/22-09/01/22)    ASSESSMENT & PLAN   Assessment  Assessment details:    Chanci is a pleasant 66 y.o. female who presents for Physical Therapy follow-up with LBP/radicular pain. She also presents with impaired balance, gait impairment, and increased risk for falls. Tolerated LE strengthening well today with some discomfort when sciatic nerve was stretched. She continues to be limited in ability to ambulate safely with front wheeled walker and complete tight turns. She will benefit from skilled Physical Therapy intervention to address the moderate impairments listed below and to assist the patient in maximizing her functional independence and safe return to prior level of function.             Impairments: decreased endurance, pain, decreased strength, decreased range of motion and impaired motor control      Personal Factors/Comorbidities: 3+    Specific Comorbidities: Bronchiectasis, Asthma-COPD overlap syndrome, scoliosis, osteoporosis, sjogren's, abnormal gait, anxiety, leukopenia, grief    Examination of Body Systems: musculoskeletal, activity/participation and communication    Clinical Presentation: stable    Clinical Decision Making: low    Prognosis: good prognosis    Negative Prognosis Rationale: medical status/condition, chronicity of condition and severity of symptoms.      Therapy Goals      Goals:      1. In 12 weeks the patient will demonstrate independent performance of HEP to maintain functional gains.   2. In 12 weeks the patient will report avg pain < or =4/10  for improved quality of life. (6/10 on evaluation)  3. In 12 weeks the patient will demonstrate ability to ambulate 200 m continuously for improved community ambulation (currenlty reports difficulty walking > 100 ft).   4. In 12 weeks the patient will score a 10 point change on the ODI to demonstrate the MDC (0-100%; lower score indicates a lesser level of disability) and to indicate improved activity tolerance. (25/50 on initial evaluation)  5. In 12 weeks the patient will complete 5 independent sit to stand transfers in <10 seconds to demonstrate increased functional leg strength. (currently 39s for 5 STS with UE assist)        Plan    Therapy options: will be seen for skilled physical therapy services    Planned therapy interventions: Aquatic Therapy, Location manager, Education - Patient, Endurance Activites, Functional Mobility, Gait Training, Home Exercise Program, Manual Therapy, Neuromuscular Re-education, Therapeutic Activities, Therapeutic Exercises, Education - Family/Caregiver, Dry Needling, E-Stim, TENS, Self-Care/Home Training, Civil engineer, contracting and Diaphragmatic/Pursed-lip Breathing    DME Equipment: [employer].    Frequency: 1x week    Duration in weeks: 12    Education provided to: patient.    Education provided: HEP, Treatment options and plan, Symptom management, Safety education, Importance of Therapy, Anatomy, Body mechanics, Role of therapy in Rehabilitation, Posture, Community resources and Body awareness    Education results: verbalized good understanding, demonstrates understanding and needs reinforcement.    Communication/Consultation: N/A.  SUBJECTIVE   Interpreter Use: Not applicable    History of Present Condition      History of Present Condition/Chief Complaint:       -LBP radiating down L LE x 2.5 months, has persisted since having shingles in May   -Constant Back pain; Leg pain intermittent    Sent to PT for back pain and balance deficits.  Reports constant back pain and intermittent leg pain/tingling since having shingles in May.  They found that there is deterioration in my spine. 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  Now also having shortness of breath, decreased stamina and is scheduled for Pulmonary rehab.  Feeling more off balance again.  Walks with the [facility] now, but had progressed to doing some walking without the walker after last bout of PT.  Now having back pain.    Referred for pulmonary rehab.  Back pain worse in the past few months.      Previous treatment- spinal tap seemed to make it worse  Falls- no falls in the past year- last one Aug 2022; has had home modifications to decrease risk for falls  Home-  Lives alone since her husband passed; Daughter [name] checks in daily  CLOF- ambulates 100 yards at most at a time; took breaks walking from the parking lot; needs to take breaks due to gait issues, back, & fatigue  Any current exercise?- stretching some, moving arms and legs a couple days week,  No regular walking or aerobic exercise currently- has had difficulty due to heat and air quality    Date of Onset:  03/23/2022  Date is an approximation?: yes  Explanation:  Back and L LE pain began after patient diagnosed with shingles in May  Subjective:     Reports doing well. Left foot has neuropathy and has noticed more swelling on L foot/ankle. Reports always has had pain in calf. Swelling worse at night. Has tried icing it with no help. Hx venous insufficiency.       Pain  Current pain rating: 5  At best pain rating: 6  At worst pain rating: 8  Location: 6/10 back and 4-5/10 leg  Quality: aching, sore, numbing, tingling and tightness  Relieving factors: ice and heat (stretching, changing positions)  Aggravating factors: walking, when still, lifting and bending (uses grabber)  Pain Related Behaviors: avoidance (gardening, sits down to vacuum, sits down to mop)  Progression: improved  Red flags: none.    Precautions and Equipment  Precautions: Osteoporosis  Current Braces/Orthoses: None  Equipment Currently Used: Rolling walker, Wheelchair - manual and Single point cane (w/c for long distances; used more after her hip replacement, but  not much now)  Prior Functional Status:     Functional Limitation(s)-balance deficits, limited walking tolerance  Current functional status: limited household activities, limited standing tolerance, limited exercise, limited lifting, unsteady gait, limited recreation, limited walking tolerance and use of assistive device  Social Support  Lives in: [place] house (one step to enter without rail, 1/2 step)  Lives with: alone (daughter, heather checks in daily)  Hand dominance: right  Communication Preference: verbal, written and visual  Barriers to Learning: No Barriers  Work/School: retired    Diagnostic Tests  X-ray: abnormal        Treatments  Current treatment: physical therapy      Patient Goals  Patient goals for therapy: decreased pain, improved ambulation, improved balance, increased strength and independence  with ADLs/IADLs (able to do more, get stronger, keep the abilities that I have)      OBJECTIVE     Posture/Observation:   Sitting: slouched seated posture  Standing: stands in [employer] with wide BOS, B knee flexion, heavy reliance on UE's    Function:  5xSTS: heavy reliance on L UE (wide chair, sitting towards L) x 39s, difficulty coming to full standing position  2 MWT: 134 feet with rolling walker    Gait:  Ambulates with ROLLING WALKER with heavy reliance on UE's, needs cues to stay close to walker, B knees flexed, walks on toes     Knee ROM:  Lacking 26 degrees extension AROM on R  Lacking 10 degrees extension AROM on L    Modified Oswestry:  25/50= 50%    Well's DVT Criteria: negative on each component, scored 0, DVT unlikely    TREATMENT RENDERED     Therapeutic Exercise:  45 Minutes   Performed with direct PT demonstration, instruction, supervision, and guidance.   - Education on condition, prognosis, and PT POC  - Nustep x5 minutes seat 9, arms 9, lvl 2 - increase resistance next session   - Standing calf raises 2x15   - LAQ with 2.5# ankle weights 2x12 - pt reports this is harder on her left   - DVT screen - DVT unlikely per Well's DVT screening criteria  - Manual tibial nerve flossing x20 - decreased pt burning pain in left heel  - Standing hamstring curls with 2.5#  ankle weights 2x12  - Standing hip abduction with 2.5# ankle weights 2x12  - Supported mini squats 2x8    HEP Access Code: [id number]  Next Visit Plan: sidelying neutral gapping for pain control; assess response to current HEP; progress LE strength/balance, OTAGO based exercises,  manual therapy (STM, rotational mobs)    Total Treatment Time: 45 Minutes                          I attest that I have reviewed the above information.  Signed: Fidela Salisbury, PT, DPT  07/13/2022 3:53 PM        I reviewed the no-show/attendance policy with the patient and caregiver(s). The patient is aware that they must call to cancel appointments more than 24 hours in advance. They are also aware that if they late cancel or no-show three times, we reserve the right to cancel their remaining appointments. This policy is in place to allow [place] to best serve the needs of our caseload.    If patient returns to clinic with variance in plan of care, then it may be attributable to one or more of the following factors: preferred clinician availability, appointment time request availability, therapy pool appointment availability, major holiday with clinic closure, caregiver availability, patient transportation, conflicting medical appointment, inclement weather, and/or patient illness.    If patient does not return for follow up visit(s) related to this episode of care, this note will serve as their discharge note from Physical Therapy. Fidela Salisbury, PT, DPT  07/15/2022 10:26 AM        I reviewed the no-show/attendance policy with the patient and caregiver(s). The patient is aware that they must call to cancel appointments more than 24 hours in advance. They are also aware that if they late cancel or no-show three times, we reserve the right to cancel their remaining appointments. This policy is in place to allow [place] to best serve the needs  of our caseload.    If patient returns to clinic with variance in plan of care, then it may be attributable to one or more of the following factors: preferred clinician availability, appointment time request availability, therapy pool appointment availability, major holiday with clinic closure, caregiver availability, patient transportation, conflicting medical appointment, inclement weather, and/or patient illness.    If patient does not return for follow up visit(s) related to this episode of care, this note will serve as their discharge note from Physical Therapy.

## 2022-07-15 ENCOUNTER — Ambulatory Visit: Admit: 2022-07-15 | Discharge: 2022-08-07 | Payer: MEDICAID

## 2022-07-15 ENCOUNTER — Ambulatory Visit: Admit: 2022-07-15 | Payer: MEDICAID

## 2022-07-16 ENCOUNTER — Ambulatory Visit
Admit: 2022-07-16 | Discharge: 2022-07-16 | Payer: MEDICAID | Attending: Physical Medicine & Rehabilitation | Primary: Physical Medicine & Rehabilitation

## 2022-07-16 ENCOUNTER — Ambulatory Visit: Admit: 2022-07-16 | Discharge: 2022-07-16 | Payer: MEDICAID

## 2022-07-16 DIAGNOSIS — M5441 Lumbago with sciatica, right side: Principal | ICD-10-CM

## 2022-07-16 DIAGNOSIS — M5442 Lumbago with sciatica, left side: Principal | ICD-10-CM

## 2022-07-16 DIAGNOSIS — R29898 Other symptoms and signs involving the musculoskeletal system: Principal | ICD-10-CM

## 2022-07-16 DIAGNOSIS — G8929 Other chronic pain: Principal | ICD-10-CM

## 2022-07-16 NOTE — Unmapped (Addendum)
[facility]  Physical Medicine and Rehabilitation     Patient Name:Christy Ibarra  MRN: [id number]  DOB: 1956/05/11  Age: 66 y.o.     ----------------------------------------------------------------------------------------------------------------------  July 16, 2022 7:55 AM. Documentation assistance provided by Hetty Blend, medical scribe, at the direction of Valeda Malm, MD.  ----------------------------------------------------------------------------------------------------------------------    ASSESSMENT & PLAN:     07/16/22     DIAGNOSIS:   Bilateral low back pain with radiculitis left LE. H/o shingles- possible post-shingles radiculitis.     Bilateral cervical radiculopathy vs myopathy (Sjogrens myopathy or associated IBM)    TREATMENT PLAN:   Ordered lumbar x-ray to be done on the way out today.    Continue physical therapy for 4 more weeks (6 weeks total).    Ordered new EMG test to further evaluate radicular symptoms in the left arm. If you have not been contacted within 2 weeks, please call [phone] to schedule. This test will be located at:   [facility] (EMG/EEG)  [facility], 1st Floor  [address] [place], [place] [address]    Continue gabapentin and tylenol. Can increase daytime gabapentin dose to 200mg  if it does not cause drowsiness. Can consider switching to lyrica if this makes you too drowsy.    NEXT STEPS/FOLLOW UP: 3 month follow up + MyChart message at the end of PT    Can consider lumbar MRI and lumbar epidural steroid injection pending MRI if symptoms worsen or fail to improve after 6 weeks of physical therapy    Comprehensive patient education performed today explaining that the care plan will integrate multimodal activity-based rehabilitation techniques to enhance recovery, reduce pain, and facilitate functionality. Risks, benefits, and instructions for all medications prescribed / treatments offered  reviewed extensively with patient, who expresses understanding.  Patient strongly advised regarding red flag signs such as new or progressive motor weakness, sensory deficits, saddle anesthesia, bowel/bladder dysfunction, gait/coordination disturbance, weight loss, and night pain that should prompt evaluation at nearest ER.    A consultation report has been transmitted to the consult requesting physician.    SUBJECTIVE:     Chief Complaint:    gait issues, Back Pain, Leg Pain, Numbness, Extremity Weakness, and Difficulty Walking    History of Present Illness:   Christy Ibarra is a 66 y.o. year old female being evaluated in consultation at the request of Johnsie Cancel, MD for gait issues, Back Pain, Leg Pain, Numbness, Extremity Weakness, and Difficulty Walking    I have reviewed the referring provider's note. Chronic constant midline low back pain w/ no known triggers. Currently pt endorses a suspected stroke vs TIA 3 yrs ago with persistent upper and lower extremity sx. Reports numbness radiates down arms into hands. Denies neck pain.    She also has low back pain w/ BLE radicular sx L>R into the bottom of the feet. Pain radiates down posterior legs (recent Shingles rash located along posterior thigh). She endorses balance deficits w/ unknown cause, and she ambulates w a walker. She already had the radicular pain, but Shingles worsened the pain.    Symptom Location: midline low back, BLE, BUE  Symptom Character: tingling  Symptom Onset/Mechanism: chronic (>/= 3 months), no dinstinctly identifiable mechanism or activity, and no known trauma or injury  Temporal Pattern: constant  NRS Pain Intensity: 5  Aggravating Factors: sitting, walking, lying down  Alleviating Factors: medications (gabapentin), ice  Night Pain: yes   Unintended Weight Loss: no  Fever/Infection:no  Neuromotor Function: motor  weakness - (no),  gait/coordination disturbance - (no), loss of bowel or bladder control - (no), saddle anesthesia - (no)     Prior Interventions/Modalities:  - PT currently, helpful  - lidocaine patches, unhelpful  - tylenol  - naproxen 250mg  BID    Meds:  - cymbalta previously, not tolerated  - gabapentin, 100mg  AM + 200mg  PM (helpful)    Prior Diagnostics:  MRI Cervical Spine 05/14/22  Impression   Multilevel cervical spondylosis and degenerative disc disease most pronounced at C5-C6 and mildly progressed since 11/29/2019.     EMG 10/17/19  Conclusions:  Abnormal study.  These electrodiagnostic findings are more consistent with a chronic left S1 radiculopathy with no active denervation.  There is also electrodiagnostic evidence suggestive of a chronic left C7 radiculopathy.     There is no electrodiagnostic evidence for large fiber neuropathy, mononeuropathy, or myopathy.    Current Outpatient Medications   Medication Sig Dispense Refill    abaloparatide 80 mcg (3,120 mcg/1.56 mL) PnIj Inject 80 mcg under the skin daily. 1.56 mL 6    albuterol 2.5 mg /3 mL (0.083 %) nebulizer solution Inhale 3 mL (2.5 mg total) by nebulization every six (6) hours as needed for wheezing or shortness of breath. 360 mL 11    albuterol HFA 90 mcg/actuation inhaler Inhale 1 puff every four (4) hours as needed for wheezing. 8.5 g 11    budesonide-formoteroL (SYMBICORT) 160-4.5 mcg/actuation inhaler Inhale 2 puffs Two (2) times a day. 10.2 g 11    citalopram (CELEXA) 40 MG tablet Take 1 tablet (40 mg total) by mouth daily. 90 tablet 2    diclofenac sodium (VOLTAREN) 1 % gel Apply 2 g topically four (4) times a day. 100 g 0    empty container (SHARPS CONTAINER) Misc Use as directed 1 each 2    EPINEPHrine (EPIPEN) 0.3 mg/0.3 mL injection Inject 0.3 mL (0.3 mg total) into the muscle once as needed for anaphylaxis for up to 1 dose. 2 each 1    gabapentin (NEURONTIN) 100 MG capsule Take 1 capsule (100 mg) in the morning, 1 capsule (100 mg) in the afternoon and 2 capsules (200 mg) at night 120 capsule 3    hydrOXYchloroQUINE (PLAQUENIL) 200 mg tablet Take 1 tablet (200 mg total) by mouth daily. 90 tablet 3    hydrOXYzine (ATARAX) 25 MG tablet Take 1 tablet (25 mg total) by mouth two (2) times a day as needed. 60 tablet 2    ipratropium (ATROVENT) 0.02 % nebulizer solution Inhale the contents of 1 vial (500 mcg total) by nebulization Four (4) times a day. 62.5 mL 12    lidocaine (XYLOCAINE) 5 % ointment Apply topically daily. 141.76 g 0    montelukast (SINGULAIR) 10 mg tablet Take 1 tablet (10 mg total) by mouth daily. 90 tablet 3    omalizumab (XOLAIR) 150 mg/mL syringe Inject the contents of 2 syringes (300 mg total) under the skin every twenty-eight (28) days. 2 mL 11    omeprazole (PRILOSEC) 20 MG capsule Take 1 capsule (20 mg total) by mouth daily. 90 capsule 3    pen needle, diabetic 31 gauge x 3/16 (5 mm) Ndle Use one new needle each morning for injection 100 each 3    pilocarpine (SALAGEN) 5 MG tablet Take 1 tablet (5 mg total) by mouth Three (3) times a day. 270 tablet 3    traZODone (DESYREL) 50 MG tablet Take 0.5 tablets (25 mg total) by mouth nightly. 90 tablet 2  triamcinolone (KENALOG) 0.1 % cream Apply topically Two (2) times a day. Apply to back for itching 45 g 0     Current Facility-Administered Medications   Medication Dose Route Frequency Provider Last Rate Last Admin    sodium chloride 3 % nebulizer solution 4 mL  4 mL Nebulization Once Ellin Mayhew, MD           Allergies:   Center-al house dust    Past Medical / Surgical History:     Past Medical History:   Diagnosis Date    Anxiety     Caregiver stress 06/23/2021    Cataract     COPD (chronic obstructive pulmonary disease) (CMS-HCC)     Depression     Dry eyes     Joint pain     Scoliosis        Past Surgical History:   Procedure Laterality Date    HYSTERECTOMY      PR BRONCHOSCOPY,DIAGNOSTIC W LAVAGE Bilateral 09/20/2020    Procedure: BRONCHOSCOPY, RIGID OR FLEXIBLE, INCLUDE FLUOROSCOPIC GUIDANCE WHEN PERFORMED; W/BRONCHIAL ALVEOLAR LAVAGE WITH MODERATE SEDATION;  Surgeon: Sheran Spine, MD;  Location: BRONCH PROCEDURE LAB [facility];  Service: Pulmonary    PR SUBTOT REMV VITREOUS,MECH VIRECTOMY Right 08/26/2020    Procedure: REMOVAL OF VITREOUS, ANTERIOR APPROACH(OPEN SKY TECHNIQUE OR LIMBAL INCISION); SUBTL REMOVAL W/MECH VITRECT;  Surgeon: Jeannene Patella, MD;  Location: [facility];  Service: Ophthalmology    PR UPPER GI ENDOSCOPY,BIOPSY N/A 12/11/2021    Procedure: UGI ENDOSCOPY; WITH BIOPSY, SINGLE OR MULTIPLE;  Surgeon: Marene Lenz, MD;  Location: GI PROC[facility]URES [facility];  Service: Gastroenterology    PR XCAPSL CTRC RMVL INSJ IO LENS PROSTH W/O ECP Left 08/12/2020    Procedure: EXTRACAPSULAR CATARACT REMOVAL W/INSERTION OF INTRAOCULAR LENS PROSTHESIS, MANUAL OR MECHANICAL TECHNIQUE WITHOUT ENDOSCOPIC CYCLOPHOTOCOAGULATION;  Surgeon: Jeannene Patella, MD;  Location: [facility] OR [facility];  Service: Ophthalmology    PR XCAPSL CTRC RMVL INSJ IO LENS PROSTH W/O ECP Right 08/26/2020    Procedure: EXTRACAPSULAR CATARACT REMOVAL W/INSERTION OF INTRAOCULAR LENS PROSTHESIS, MANUAL OR MECHANICAL TECHNIQUE WITHOUT ENDOSCOPIC CYCLOPHOTOCOAGULATION;  Surgeon: Jeannene Patella, MD;  Location: [facility];  Service: Ophthalmology       Social History     Socioeconomic History    Marital status: Married     Spouse name: Ezequiel Ganser    Number of children: 2    Years of education: 12    Highest education level: High school graduate   Tobacco Use    Smoking status: Former     Packs/day: 2.00     Years: 25.00     Additional pack years: 0.00     Total pack years: 50.00     Types: Cigarettes     Quit date: 2009     Years since quitting: 14.6     Passive exposure: Never    Smokeless tobacco: Never   Vaping Use    Vaping Use: Never used   Substance and Sexual Activity    Alcohol use: Not Currently    Drug use: Never   Social History Narrative    Date information was obtained:  06/23/21        Born in [place]    Raised with both parents     Parents deceased: Mother died age 1 due to ovarian cancer. Father died age 74 due to a stroke.    Siblings - an older brother and older sister  Relocated to [place]: yes   If relocated, came to [place]: year: 1   Reason:  Marriage to husband        Residential:    Who lives in the home currently?: Patient and her husband    Housing: own housing    Risk of losing current housing:  no    Homelessness: yes - 1979 -1980    Eviction: no         Food Insecurity: No currently since getting food stamps as of June '22        Transportation: car driven by family        Marital/partner status: Married to husband, Ezequiel Ganser for 41 years. Husband is 70 yrs old.    Sexually active: no     Related arguments:  no         Children: (number and ages, living with) - Daughter age 30, [name], and another daughter age 18, Tresa Endo. Also has stepchildren    Grandchildren - granddaughter age 72 and grandson age 35        Education    Through what year did you complete in school: high school graduate    History of Learning Disability or Learning Difficulty: yes - difficulty processing and understanding        Work    Income/Employment/Disability: Futures trader. 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 Electrical engineer: no     Vocational Rehabilitation: no         Financial planner:  Has not served in the [employer] to Firearms: None     Social Determinants of Health     Financial Resource Strain: High Risk (06/23/2021)    Overall Financial Resource Strain (CARDIA)     Difficulty of Paying Living Expenses: Hard   Food Insecurity: Food Insecurity Present (06/23/2021)    Hunger Vital Sign     Worried About Programme researcher, broadcasting/film/video in the Last Year: Sometimes true     Ran Out of Food in the Last Year: Sometimes true   Transportation Needs: No Transportation Needs (06/23/2021)    PRAPARE - Therapist, art (Medical): No     Lack of Transportation (Non-Medical): No       Family History   Problem Relation Age of Onset    Cancer Mother     Ovarian cancer Mother     Breast cancer Mother 73    Diabetes Father     Heart disease Father     Hypertension Father     Osteoporosis Sister     Colon cancer Neg Hx     Endometrial cancer Neg Hx        For any of the above entries which indicate no records on file, the patient reports no relevant history.                 Review of Systems:   Review of Systems was completed through a 10 organ system review and is listed in the chart.  Pertinent positives are noted in HPI or flowsheet and otherwise negative.  Patient has been instructed to followup with PCP or appropriate specialist for symptoms outside the purview of this speciality.    OBJECTIVE:     Vitals:     Temp 36.4 ??C (  97.5 ??F) (Temporal)  - Ht 173.4 cm (5' 8.25)  - Wt 63.3 kg (139 lb 9.6 oz)  - LMP  (LMP Unknown)  - BMI 21.07 kg/m??     The above medications, allergies, history, and ROS, and vitals have been reviewed.    Physical Exam:   GEN: alert and oriented, no apparent distress  HEENT: normocephalic, atraumatic, anicteric, moist mucous membranes  CV: normal heart rate  PULM: normal work of breathing  GI: nondistended  EXT: no swelling, edema in b/l UE and LE  SKIN: no visible ecchymosis or breakdown  PSYCH: normal mood and affect    NEURO:   Manual Muscle Testing    Left Upper Extremity Right Upper Extremity   Shoulder Abduction 4/5 4/5   Elbow Flexion 4+/5 4+/5   Elbow Extension  5/5 5/5   Wrist Extension  5/5 5/5   Finger Abduction  5/5 5/5        Left Lower Extremity Right Lower Extremity   Knee Extension  5/5 5/5   EHL Extension  5/5 5/5         Reflexes     Left Upper Extremity Right Upper Extremity    Triceps 3+ 3+   Biceps 2+ 2+   Brachioradialis 3+ 3+   Hoffman's  negative negative       Left Lower Extremity Right Lower Extremity    Patellar 3+ 3+   Achilles  0 0   Babinski negative negative       Sensory  Sensation to light touch WNL throughout b/l UE and LE    MSK:  Gait and Station: normal nonantalgic gait with symmetric body posture    Cervical Spine:   Inspection: Normal alignment. No erythema, discoloration, or asymmetry.  Palpation: No paraspinal muscle tendereness.  No vertebral body point tenderness.  ROM: normal flexion, extension, rotation, lateral bend  Spurling's: - (negative) LUE, - (negative) RUE    Lumbar Spine:  Inspection: Normal alignment. No erythema, discoloration, or asymmetry.  Palpation: No paraspinal muscle tenderness.  No vertebral body point tenderness.  ROM: normal flexion, extension, rotation, lateral bend    ----------------------------------------------------------------------------------------------------------------------  July 16, 2022 7:55 AM. Documentation assistance provided by Hetty Blend, medical scribe, at the direction of Valeda Malm, MD.  ----------------------------------------------------------------------------------------------------------------------      Tia Masker, MD  Assistant Professor - PM&R  Musculoskeletal and Spine Specialist - [facility] [employer]

## 2022-07-16 NOTE — Unmapped (Addendum)
Thanks so much for coming to see Dr. Riccardo Dubin today. It was a pleasure to meet you. This summary reviews the goals and plans we discussed at your visit today.     Below, you will see: a) your working diagnosis b) your treatment plan and c) your next steps and followup plan.    We care about your quality of life and are committed to helping optimize your functionality.          DIAGNOSIS:   Bilateral low back pain with radiculitis left LE. H/o shingles.     Bilateral cervical radiculopathy vs myopathy    TREATMENT PLAN:   Ordered lumbar x-ray to be done on the way out today.    Continue physical therapy for 4 more weeks (6 weeks total).    Ordered new EMG test to further evaluate radicular symptoms in the left arm. If you have not been contacted within 2 weeks, please call [phone] to schedule. This test will be located at:   [facility] (EMG/EEG)  [facility], 1st Floor  [address] [place], [place] [address]    Continue gabapentin and tylenol. Can increase daytime gabapentin dose to 200mg  if it does not cause drowsiness. Can consider switching to lyrica if this makes you too drowsy.    NEXT STEPS/FOLLOW UP: 3 month follow up + MyChart message at the end of PT    Can consider lumbar MRI and lumbar epidural steroid injection pending MRI if symptoms worsen or fail to improve after 6 weeks of physical therapy

## 2022-07-20 NOTE — Unmapped (Signed)
[facility] [place]  OUTPATIENT PHYSICAL THERAPY  07/22/2022  Note Type: Treatment Note       Patient Name: Christy Ibarra  Date of Birth:Sep 29, 1956  Diagnosis:   Encounter Diagnoses   Name Primary?    Chronic midline low back pain with bilateral sciatica Yes    Sjogren's syndrome with keratoconjunctivitis sicca (CMS-HCC)     Risk for falls      Referring Provider: Johnsie Cancel    Date of Onset of Impairment: No date available  Date PT Care Plan Established or Reviewed: No date available  Date PT Treatment Started: No date available     Plan of Care Effective Date: 06/09/22 to 09/09/22  Session Number:  5 (3/8 covered 07/08/22-09/01/22)    ASSESSMENT & PLAN   Assessment  Assessment details:    Christy Ibarra is a pleasant 66 y.o. female who presents for Physical Therapy follow-up with LBP/radicular pain. She also presents with impaired balance, gait impairment, and increased risk for falls. She appears to have local left ankle Tarsal Tunnel syndrome contributing to her most distal symptoms. This is impacting her ability to tolerate weight bearing without shoes. We are working to get her started on aquatic therapy to progress LE strengthening and overall cardiovascular endurance. She will benefit from skilled Physical Therapy intervention to address the moderate impairments listed below and to assist the patient in maximizing her functional independence and safe return to prior level of function.             Impairments: decreased endurance, pain, decreased strength, decreased range of motion and impaired motor control      Personal Factors/Comorbidities: 3+    Specific Comorbidities: Bronchiectasis, Asthma-COPD overlap syndrome, scoliosis, osteoporosis, sjogren's, abnormal gait, anxiety, leukopenia, grief    Examination of Body Systems: musculoskeletal, activity/participation and communication    Clinical Presentation: stable    Clinical Decision Making: low    Prognosis: good prognosis    Negative Prognosis Rationale: medical status/condition, chronicity of condition and severity of symptoms.      Therapy Goals      Goals:      1. In 12 weeks the patient will demonstrate independent performance of HEP to maintain functional gains.   2. In 12 weeks the patient will report avg pain < or =4/10  for improved quality of life. (6/10 on evaluation)  3. In 12 weeks the patient will demonstrate ability to ambulate 200 m continuously for improved community ambulation (currenlty reports difficulty walking > 100 ft).   4. In 12 weeks the patient will score a 10 point change on the ODI to demonstrate the MDC (0-100%; lower score indicates a lesser level of disability) and to indicate improved activity tolerance. (25/50 on initial evaluation)  5. In 12 weeks the patient will complete 5 independent sit to stand transfers in <10 seconds to demonstrate increased functional leg strength. (currently 39s for 5 STS with UE assist)        Plan    Therapy options: will be seen for skilled physical therapy services    Planned therapy interventions: Aquatic Therapy, Location manager, Education - Patient, Endurance Activites, Functional Mobility, Gait Training, Home Exercise Program, Manual Therapy, Neuromuscular Re-education, Therapeutic Activities, Therapeutic Exercises, Education - Family/Caregiver, Dry Needling, E-Stim, TENS, Self-Care/Home Training, Civil engineer, contracting and Diaphragmatic/Pursed-lip Breathing    DME Equipment: [employer].    Frequency: 1x week    Duration in weeks: 12    Education provided to: patient.    Education provided:  HEP, Treatment options and plan, Symptom management, Safety education, Importance of Therapy, Anatomy, Body mechanics, Role of therapy in Rehabilitation, Posture, Community resources and Body awareness    Education results: verbalized good understanding, demonstrates understanding and needs reinforcement.    Communication/Consultation: N/A.              SUBJECTIVE   Interpreter Use: Not applicable    History of Present Condition      History of Present Condition/Chief Complaint:       -LBP radiating down L LE x 2.5 months, has persisted since having shingles in May   -Constant Back pain; Leg pain intermittent    Sent to PT for back pain and balance deficits.  Reports constant back pain and intermittent leg pain/tingling since having shingles in May.  They found that there is deterioration in my spine. 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  Now also having shortness of breath, decreased stamina and is scheduled for Pulmonary rehab.  Feeling more off balance again.  Walks with the [facility] now, but had progressed to doing some walking without the walker after last bout of PT.  Now having back pain.    Referred for pulmonary rehab.  Back pain worse in the past few months.      Previous treatment- spinal tap seemed to make it worse  Falls- no falls in the past year- last one Aug 2022; has had home modifications to decrease risk for falls  Home-  Lives alone since her husband passed; Daughter Christy Ibarra checks in daily  CLOF- ambulates 100 yards at most at a time; took breaks walking from the parking lot; needs to take breaks due to gait issues, back, & fatigue  Any current exercise?- stretching some, moving arms and legs a couple days week,  No regular walking or aerobic exercise currently- has had difficulty due to heat and air quality    Date of Onset:  03/23/2022  Date is an approximation?: yes  Explanation:  Back and L LE pain began after patient diagnosed with shingles in May  Subjective:     Dr. Riccardo Ibarra wants to do nerve testing on my upper extremity. Burning at night still greatest complaint. Still reports feeling some swelling into her left foot. When feeling burning at night, feels like her foot is hard. Foot pain 7/10. At end of session 3/10. Reports discomfort in her ankle has been around since hammer toe surgery (maybe 2012), but then got a little better and is now back.       Pain  Current pain rating: 5  At best pain rating: 6  At worst pain rating: 8  Location: 6/10 back and 4-5/10 leg  Quality: aching, sore, numbing, tingling and tightness  Relieving factors: ice and heat (stretching, changing positions)  Aggravating factors: walking, when still, lifting and bending (uses grabber)  Pain Related Behaviors: avoidance (gardening, sits down to vacuum, sits down to mop)  Progression: improved  Red flags: none.    Precautions and Equipment  Precautions: Osteoporosis  Current Braces/Orthoses: None  Equipment Currently Used: Rolling walker, Wheelchair - manual and Single point cane (w/c for long distances; used more after her hip replacement, but  not much now)  Prior Functional Status:     Functional Limitation(s)-balance deficits, limited walking tolerance  Current functional status: limited household activities, limited standing tolerance, limited exercise, limited lifting, unsteady gait, limited recreation, limited walking tolerance and use of  assistive device  Social Support  Lives in: [place] (one step to enter without rail, 1/2 step)  Lives with: alone (daughter, heather checks in daily)  Hand dominance: right  Communication Preference: verbal, written and visual  Barriers to Learning: No Barriers  Work/School: retired    Diagnostic Tests  X-ray: abnormal        Treatments  Current treatment: physical therapy      Patient Goals  Patient goals for therapy: decreased pain, improved ambulation, improved balance, increased strength and independence with ADLs/IADLs (able to do more, get stronger, keep the abilities that I have)      OBJECTIVE     Gait:  Ambulates with ROLLING WALKER with heavy reliance on UE's, needs cues to stay close to walker, B knees flexed, walks on toes, dragging feet more today    Aquatic Physical Therapy Attestation:   Jan M. Weathers will benefit from aquatic therapy given clinical presentation of: strength limiting functional movement or exercise, decreased coordination, motor control deficits, static and/or dynamic balance deficits, and gait abnormalities    During initial Physical Therapy Evaluation the patient was screened and identified the following precautions:   No relative contraindications identified     The following conditions require medical provider clearance prior to aquatic therapy:   No conditions identified     The following conditions were not reported or observed: nausea/vomiting, fever greater than 100 deg, diarrhea/bowel incontinence in the last two weeks, viral/bacterial/fungal disease. The patient verbalizes understanding that the session will be cancelled if any of the above conditions develop. YES    Aquatic Therapy Education:  - Discussed POC: combination of land and aquatic therapy  - Advised patient to wear shoes and bring assistive device if applicable while ambulating on pool deck   - Advised patient to bring water and snacks as appropriate   - Advised patient only members of the [facility] are permitted to use amenities such as the hot tub and sauna  - Land re-evaluation scheduled within 30 days of initial pool session by evaluating therapist: yes 09/23/22 land re-evaluation after 09/17/22 aquatic  - Anticipated method of entry:  8 stairs Independently  - Focus for aquatic therapist: Pain reduction, LE Strengthening, UE Strengthening, Investment banker, operational, Core Strengthening, and Cardiovascular Training    TREATMENT RENDERED   Manual: 25 minutes  - Tinel's at tarsal tunnel positive on L  - Manual muscle testing ankle DF/PF and inversion/eversion - all decreased strength on L compared to R with pt reported achiness   - Ankle distraction mobilization grade IV to HVLA - pt reported pain relief after this   - Calcaneal mobilization inversion and eversion with decreased symptoms afterwards     Therapeutic Exercise: 20 Minutes   Performed with direct PT demonstration, instruction, supervision, and guidance.   - Education on condition, prognosis, and PT POC  - Nustep seat 10, arms 9, lvl 1-3 x10 minutes total (did aquatics screen while on Nustep)  - Tan band windshield wiper 2x12  - Arch lifts 2x20   - Discussed that her current presentation of Tarsal Tunnel is likely not related to recent shingles infection     Held:   - Supine lumbar rotations x20  - Supine glute stretch x5 with 15 sec hold   - Supine piriformis stretch actively x10 with 2-3 sec hold   - Bridge 3x12 - pt reports has not been doing this at home; unable to get through full ROM  - Adjusted HEP to have  A and B days as pt has not been doing a lot of her HEP due to there being 6 exercises on it - dropped down to 3 exercises/day  - Supine clams blue resistance band 3x12 - pt reports no pain during the exercise, but has some shooting pain afterwards  - Supine heel taps with legs elevated off mat 3x12  - Walking laps for cool-down x2 - pt reports this felt better than the warmup laps     HEP Access Code: [email or web address]  Next Visit Plan: consider discussion on orthotics, injection at tarsal tunnel, shoewear (pt dislikes wearing shoes)    Total Treatment Time: 45 Minutes        Therapeutic Interventions Charges  $$ Therapeutic Exercise [mins]: 20  $$ Manual Therapy [mins]: 25                 I attest that I have reviewed the above information.  Signed: Fidela Salisbury, PT, DPT  07/22/2022 10:23 AM        I reviewed the no-show/attendance policy with the patient and caregiver(s). The patient is aware that they must call to cancel appointments more than 24 hours in advance. They are also aware that if they late cancel or no-show three times, we reserve the right to cancel their remaining appointments. This policy is in place to allow [place] to best serve the needs of our caseload.    If patient returns to clinic with variance in plan of care, then it may be attributable to one or more of the following factors: preferred clinician availability, appointment time request availability, therapy pool appointment availability, major holiday with clinic closure, caregiver availability, patient transportation, conflicting medical appointment, inclement weather, and/or patient illness.    If patient does not return for follow up visit(s) related to this episode of care, this note will serve as their discharge note from Physical Therapy.

## 2022-07-22 NOTE — Unmapped (Signed)
[facility] Administered Medication Refill Coordination Note      NAME:Christy Ibarra DOB: 1956/08/13      Medication: Xolair    Day Supply: 28 days      SHIPPING      Next delivery from [facility] [phone]) to  [facility]  for Christy Ibarra is scheduled for 09/07.    Clinic contact: Geanie Berlin     Patient's next nurse visit for administration: 09/11.    We will follow up with clinic monthly for standard refill processing and delivery.      Bree Heinzelman Samella Parr  Specialty Pharmacy Technician

## 2022-07-28 ENCOUNTER — Telehealth: Admit: 2022-07-28 | Discharge: 2022-07-29 | Payer: MEDICAID | Attending: Medical | Primary: Medical

## 2022-07-28 DIAGNOSIS — R198 Other specified symptoms and signs involving the digestive system and abdomen: Principal | ICD-10-CM

## 2022-07-28 DIAGNOSIS — R1319 Other dysphagia: Principal | ICD-10-CM

## 2022-07-28 NOTE — Unmapped (Signed)
Pre called and triaged for my chart visit.

## 2022-07-28 NOTE — Unmapped (Signed)
[facility] ([facility])      [facility] CEDAS Faculty Consultation Visit Note                REFERRING PROVIDER:    Johnsie Cancel, MD  [address]  [address]  [facility]  [place],  [place] [address]    PRIMARY CARE PROVIDER:    Wyline Beady, MD    Patient Care Team:  Johnsie Cancel, MD as PCP - General  Johnsie Cancel, MD as PCP - General-ATTRIBUTED  Johnsie Cancel, MD (Internal Medicine)      PATIENT PROFILE:        Christy Ibarra is a 66 y.o. female (DOB: 1956/04/05) who is seen in consultation at the request of Dr. Delane Ginger for evaluation of dysphagia.      Problem List Items Addressed This Visit          Digestive    Esophageal dysphagia          ASSESSMENT:        This is a 66 y.o. year old female who  has a past medical history of Anxiety, Caregiver stress (06/23/2021), Cataract, COPD (chronic obstructive pulmonary disease) (CMS-HCC), Depression, Dry eyes, Joint pain, and Scoliosis.   Andres Shad presents with dysphagia completely resolved on PPI.  EGD 11/2021 with nodularity at the [facility], pathology positive for IM and surveillance EGD pending for 11/2022.  Will proceed as follows:          PLAN:          1.  Repeat EGD 11/2022  2.  Continue PPI.    3.  Given the # to GIP to call and schedule eHRM and 24 hour pHMII (ordered by pulmonology).  4.  Follow up in 6 months.   5.  My contact information was provided and she will call with any questions or concerns in the interim.      Thank you for this consultation.        The patient reports they are currently: at home. I spent 14 minutes on the real-time audio and video visit with the patient on the date of service. I spent an additional 8 minutes on pre- and post-visit activities on the date of service.     The patient was not located and I was not located within 250 yards of a hospital based location during the real-time audio and video visit. The patient was physically located in [place] or a state in which I am permitted to provide care. The patient and/or parent/guardian understood that s/he may incur co-pays and cost sharing, and agreed to the telemedicine visit. The visit was reasonable and appropriate under the circumstances given the patient's presentation at the time.    The patient and/or parent/guardian has been advised of the potential risks and limitations of this mode of treatment (including, but not limited to, the absence of in-person examination) and has agreed to be treated using telemedicine. The patient's/patient's family's questions regarding telemedicine have been answered.    If the visit was completed in an ambulatory setting, the patient and/or parent/guardian has also been advised to contact their provider???s office for worsening conditions, and seek emergency medical treatment and/or call 911 if the patient deems either necessary.                    CHIEF COMPLAINT:  My food was  getting stuck but I'm doing good now    HISTORY OF PRESENT ILLNESS: This is a 65 y.o. year old female presenting to the [place] [facility] [place] [facility] for Esophageal Diseases [facility] Swallowing ([facility]) clinic today in consultation for dysphagia.  Christy Ibarra has a past medical history of Anxiety, Caregiver stress (06/23/2021), Cataract, COPD (chronic obstructive pulmonary disease) (CMS-HCC), Depression, Dry eyes, Joint pain, and Scoliosis.    Christy Ibarra's visit is conducted by video today due to COVID-19.  She states she is home by herself and agrees to proceed with this visit.     Christy Ibarra states last year she developed mid thoracic esophageal dysphagia to solid foods.  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  She was started on PPI and states her symptoms are resolved entirely.  She denies cough with po intake, hoarseness, sore throat, odynophagia, chest pain, dysphagia, weight loss, nausea, vomiting, heartburn, regurgitation, diarrhea, constipation, hematochezia, or melena.  She is to repeat EGD in 11/2022 and her pulmonology team has ordered eHRM and 24 hour pHMII.      DIAGNOSTIC STUDIES:  I have reviewed and summarized previous medical records which illustrated:     GI Procedures:    EGD 12/11/2021:   Mild nodularity at the GEJ  Gastric erythema    A: Gastric, biopsy  - Gastric oxyntic and antral type mucosa with chronic superficial gastritis  - No Helicobacter pylori identified on H+E or immunohistochemical stain    B: Gastroesophageal junction, biopsy  - Gastric cardiac type mucosa with focal intestinal metaplasia, negative for dysplasia  - Mildly active chronic superficial gastritis  - No squamous epithelium present for evaluation    Radiographic studies:    XR Lumbar Spine 2 or 3 Views    Result Date: 07/16/2022  EXAM: XR LUMBAR SPINE 2 OR 3 VIEWS DATE: 07/16/2022 2:53 PM ACCESSION: [id number] UN DICTATED: 07/16/2022 2:56 PM INTERPRETATION LOCATION: Main [facility]     CLINICAL INDICATION: 65 years old Female with low back  - R29.898 - Arm weakness      COMPARISON: Thoracic MRI 11/29/2019     TECHNIQUE: AP and lateral views of the lumbar spine.     FINDINGS: There are 5 nonrib-bearing lumbar-type vertebral bodies. Levoconvex curvature of the spine, apex at L2-3. No acute fracture. No measurable listhesis. Vertebral body heights preserved. Multilevel intervertebral disc space narrowing. Multilevel small anterior osteophytes and facet osteoarthropathy.             1. Mild degenerative disc disease of the lumbar spine. 2. Levoconvex curvature of the lumbar spine.     Laboratory results:    Procedure visit on 05/22/2022   Component Date Value Ref Range Status    IgG, CSF 05/22/2022 5.2  <7.0 mg/dL Final    Albumin, CSF 32/44/0102 16.2  7.1 - 35.7 mg/dL Final    Tube # CSF 72/53/6644 Tube 1   Final    Color, CSF 05/22/2022 Colorless   Final    Appearance, CSF 05/22/2022 Clear   Final    Nucleated Cells, CSF 05/22/2022 5  0 - 5 ul Final    RBC, CSF 05/22/2022 1 (H)  0 ul Final    Lymphs %, CSF 05/22/2022 96.0  % Final    Mono/Macrophage %, CSF 05/22/2022 4.0  % Final    #Cells counted for Diff 05/22/2022 100   Final    Tube # CSF 05/22/2022 Tube 4   Final  Color, CSF 05/22/2022 Colorless   Final    Appearance, CSF 05/22/2022 Clear   Final    Nucleated Cells, CSF 05/22/2022 3  0 - 5 ul Final    RBC, CSF 05/22/2022 19 (H)  0 ul Final    Neutrophil %, CSF 05/22/2022 1.0  % Final    Lymphs %, CSF 05/22/2022 96.0  % Final    Mono/Macrophage %, CSF 05/22/2022 3.0  % Final    #Cells counted for Diff 05/22/2022 100   Final    Protein, CSF 05/22/2022 45  20 - 63 mg/dL Final    Glucose, CSF 16/08/9603 55  48 - 79 mg/dL Final    NMO-IgG, CSF 54/07/8118 Negative  Negative Final    NMO AQP4 IgG, Serum 05/22/2022 Negative  Negative Final    Albumin, Serum 05/22/2022 3,600  3,500 - 5,000 mg/dL Final    CSF/SER Alb Ratio 05/22/2022 5.9  <9.0 Final    CSF IGG Index 05/22/2022 0.5  0.3 - 0.8 Final    CSF Oligoclonal Bands 05/22/2022    Final    Ratio Interpretation 05/22/2022    Final    Albumin 05/22/2022 3.6  3.4 - 5.0 g/dL Final    Total IgG 14/78/2956 1,655  646 - 2,013 mg/dL Final       REVIEW OF SYSTEMS:     Pertinent positives and negatives are documented as per HPI; all other systems reviewed and negative.      PAST MEDICAL HISTORY:    Past Medical History:   Diagnosis Date    Anxiety     Caregiver stress 06/23/2021    Cataract     COPD (chronic obstructive pulmonary disease) (CMS-HCC)     Depression     Dry eyes     Joint pain     Scoliosis        PAST SURGICAL HISTORY:    Past Surgical History:   Procedure Laterality Date    HYSTERECTOMY      PR BRONCHOSCOPY,DIAGNOSTIC W LAVAGE Bilateral 09/20/2020    Procedure: BRONCHOSCOPY, RIGID OR FLEXIBLE, INCLUDE FLUOROSCOPIC GUIDANCE WHEN PERFORMED; W/BRONCHIAL ALVEOLAR LAVAGE WITH MODERATE SEDATION;  Surgeon: Sheran Spine, MD;  Location: BRONCH PROCEDURE LAB [facility];  Service: Pulmonary    PR SUBTOT REMV VITREOUS,MECH VIRECTOMY Right 08/26/2020    Procedure: REMOVAL OF VITREOUS, ANTERIOR APPROACH(OPEN SKY TECHNIQUE OR LIMBAL INCISION); SUBTL REMOVAL W/MECH VITRECT;  Surgeon: Jeannene Patella, MD;  Location: [facility] OR [facility];  Service: Ophthalmology    PR UPPER GI ENDOSCOPY,BIOPSY N/A 12/11/2021    Procedure: UGI ENDOSCOPY; WITH BIOPSY, SINGLE OR MULTIPLE;  Surgeon: Marene Lenz, MD;  Location: [facility];  Service: Gastroenterology    PR XCAPSL CTRC RMVL INSJ IO LENS PROSTH W/O ECP Left 08/12/2020    Procedure: EXTRACAPSULAR CATARACT REMOVAL W/INSERTION OF INTRAOCULAR LENS PROSTHESIS, MANUAL OR MECHANICAL TECHNIQUE WITHOUT ENDOSCOPIC CYCLOPHOTOCOAGULATION;  Surgeon: Jeannene Patella, MD;  Location: [facility] OR [facility];  Service: Ophthalmology    PR XCAPSL CTRC RMVL INSJ IO LENS PROSTH W/O ECP Right 08/26/2020    Procedure: EXTRACAPSULAR CATARACT REMOVAL W/INSERTION OF INTRAOCULAR LENS PROSTHESIS, MANUAL OR MECHANICAL TECHNIQUE WITHOUT ENDOSCOPIC CYCLOPHOTOCOAGULATION;  Surgeon: Jeannene Patella, MD;  Location: [facility] OR [facility];  Service: Ophthalmology       MEDICATIONS:   Current Outpatient Medications   Medication Sig Dispense Refill    abaloparatide 80 mcg (3,120 mcg/1.56 mL) PnIj Inject 80 mcg under the skin daily. 1.56 mL 6    albuterol  HFA 90 mcg/actuation inhaler Inhale 1 puff every four (4) hours as needed for wheezing. 8.5 g 11    budesonide-formoteroL (SYMBICORT) 160-4.5 mcg/actuation inhaler Inhale 2 puffs Two (2) times a day. 10.2 g 11    citalopram (CELEXA) 40 MG tablet Take 1 tablet (40 mg total) by mouth daily. 90 tablet 2    empty container (SHARPS CONTAINER) Misc Use as directed 1 each 2    EPINEPHrine (EPIPEN) 0.3 mg/0.3 mL injection Inject 0.3 mL (0.3 mg total) into the muscle once as needed for anaphylaxis for up to 1 dose. 2 each 1    gabapentin (NEURONTIN) 100 MG capsule Take 1 capsule (100 mg) in the morning, 1 capsule (100 mg) in the afternoon and 2 capsules (200 mg) at night 120 capsule 3    hydrOXYchloroQUINE (PLAQUENIL) 200 mg tablet Take 1 tablet (200 mg total) by mouth daily. 90 tablet 3    hydrOXYzine (ATARAX) 25 MG tablet Take 1 tablet (25 mg total) by mouth two (2) times a day as needed. 60 tablet 2    ipratropium (ATROVENT) 0.02 % nebulizer solution Inhale the contents of 1 vial (500 mcg total) by nebulization Four (4) times a day. 62.5 mL 12    lidocaine (XYLOCAINE) 5 % ointment Apply topically daily. 141.76 g 0    montelukast (SINGULAIR) 10 mg tablet Take 1 tablet (10 mg total) by mouth daily. 90 tablet 3    omalizumab (XOLAIR) 150 mg/mL syringe Inject the contents of 2 syringes (300 mg total) under the skin every twenty-eight (28) days. 2 mL 11    omeprazole (PRILOSEC) 20 MG capsule Take 1 capsule (20 mg total) by mouth daily. 90 capsule 3    pen needle, diabetic 31 gauge x 3/16 (5 mm) Ndle Use one new needle each morning for injection 100 each 3    pilocarpine (SALAGEN) 5 MG tablet Take 1 tablet (5 mg total) by mouth Three (3) times a day. 270 tablet 3    traZODone (DESYREL) 50 MG tablet Take 0.5 tablets (25 mg total) by mouth nightly. 90 tablet 2    albuterol 2.5 mg /3 mL (0.083 %) nebulizer solution Inhale 3 mL (2.5 mg total) by nebulization every six (6) hours as needed for wheezing or shortness of breath. 360 mL 11    diclofenac sodium (VOLTAREN) 1 % gel Apply 2 g topically four (4) times a day. (Patient not taking: Reported on 07/28/2022) 100 g 0    triamcinolone (KENALOG) 0.1 % cream Apply topically Two (2) times a day. Apply to back for itching 45 g 0     Current Facility-Administered Medications   Medication Dose Route Frequency Provider Last Rate Last Admin    sodium chloride 3 % nebulizer solution 4 mL  4 mL Nebulization Once Ellin Mayhew, MD           ALLERGIES:    Allergies   Allergen Reactions    [facility] Dust Cough       SOCIAL HISTORY:    Social History     Socioeconomic History    Marital status: Single Spouse name: Ezequiel Ganser    Number of children: 2    Years of education: 12    Highest education level: High school graduate   Tobacco Use    Smoking status: Former     Packs/day: 2.00     Years: 25.00     Additional pack years: 0.00     Total pack years: 50.00  Types: Cigarettes     Quit date: 2009     Years since quitting: 14.6     Passive exposure: Never    Smokeless tobacco: Never   Vaping Use    Vaping Use: Never used   Substance and Sexual Activity    Alcohol use: Not Currently    Drug use: Never    Sexual activity: Yes     Partners: Male   Social History Narrative    Date information was obtained:  06/23/21        Born in [place]    Raised with both parents     Parents deceased: Mother died age 68 due to ovarian cancer. Father died age 20 due to a stroke.    Siblings - an older brother and older sister        Relocated to [place]: yes   If relocated, came to [place]: year: 58   Reason:  Marriage to husband        Residential:    Who lives in the home currently?: Patient and her husband    Housing: own housing    Risk of losing current housing:  no    Homelessness: yes - 1979 -1980    Eviction: no         Food Insecurity: No currently since getting food stamps as of June '22        Transportation: car driven by family        Marital/partner status: Married to husband, Ezequiel Ganser for 41 years. Husband is 58 yrs old.    Sexually active: no     Related arguments:  no         Children: (number and ages, living with) - Daughter age 50, [name], and another daughter age 31, Tresa Endo. Also has stepchildren    Grandchildren - granddaughter age 62 and grandson age 53        Education    Through what year did you complete in school: high school graduate    History of Learning Disability or Learning Difficulty: yes - difficulty processing and understanding        Work    Income/Employment/Disability: Futures trader. 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 Electrical engineer: no     Vocational Rehabilitation: no         Financial planner:  Has not served in the [employer] to Firearms: None     Social Determinants of Health     Financial Resource Strain: High Risk (06/23/2021)    Overall Financial Resource Strain (CARDIA)     Difficulty of Paying Living Expenses: Hard   Food Insecurity: Food Insecurity Present (06/23/2021)    Hunger Vital Sign     Worried About Running Out of Food in the Last Year: Sometimes true     Ran Out of Food in the Last Year: Sometimes true   Transportation Needs: No Transportation Needs (06/23/2021)    PRAPARE - Therapist, art (Medical): No     Lack of Transportation (Non-Medical): No       FAMILY HISTORY:    Family History   Problem Relation Age of Onset    Cancer Mother     Ovarian cancer Mother     Breast cancer Mother 42  Diabetes Father     Heart disease Father     Hypertension Father     Osteoporosis Sister     Colon cancer Neg Hx     Endometrial cancer Neg Hx            VITAL SIGNS:    LMP  (LMP Unknown)     PHYSICAL EXAM:  CONSTITUTIONAL: Well developed in no acute distress.   EYES: conjunctivae clear; sclerae anicteric.  ENT: adequate dentition.  NECK:  symmetric.  RESPIRATORY: normal respiratory effort.  SKIN: no rashes, lesions visualized.  PSYCHIATRIC: awake, alert, and oriented to time, place, and person; insight and judgment adequate; mood and affect congruent.      This note has been created using [employer]. The note has been reviewed for accuracy, however errors may not always be identified. Such creation errors do NOT reflect on the standard of medical care rendered to this patient.

## 2022-07-29 MED FILL — XOLAIR 150 MG/ML SUBCUTANEOUS SYRINGE: SUBCUTANEOUS | 28 days supply | Qty: 2 | Fill #4

## 2022-08-03 ENCOUNTER — Institutional Professional Consult (permissible substitution): Admit: 2022-08-03 | Discharge: 2022-08-04 | Payer: MEDICAID

## 2022-08-03 NOTE — Unmapped (Signed)
Nursing Assessment completed.   Drug allergies assessed.     Med supplied: [facility]-Patient Supplied     Drug Administered: Xolair 300mg    Route: SubQ  Frequency: 28 days     Right lower abdomen: 150 mg  Lot #: [id number] / Exp 05/2023    Left lower abdomen: 150 mg  Lot #: [id number] / Exp 05/2023    Assessment:  Tolerated procedure well, no side effects. Patient has epi pen.   Patient aware to contact office if side effects occur.     Delice Lesch, RN

## 2022-08-03 NOTE — Unmapped (Unsigned)
[facility] PT [facility]  OUTPATIENT PHYSICAL THERAPY  08/05/2022  Note Type: Treatment Note       Patient Name: Christy Ibarra  Date of Birth:[birth year]  Diagnosis:   Encounter Diagnoses   Name Primary?    Chronic midline low back pain with bilateral sciatica Yes    Sjogren's syndrome with keratoconjunctivitis sicca (CMS-HCC)     Risk for falls      Referring Provider: Johnsie Cancel    Date of Onset of Impairment: No date available  Date PT Care Plan Established or Reviewed: No date available  Date PT Treatment Started: No date available     Plan of Care Effective Date: 06/09/22 to 09/09/22  Session Number:  6 (4/8 covered 07/08/22-09/01/22)    ASSESSMENT & PLAN   Assessment  Assessment details:    Arreanna is a pleasant 66 y.o. female who presents for Physical Therapy follow-up with LBP/radicular pain. She also presents with impaired balance, gait impairment, and increased risk for falls. Shortened session due to [facility] lock down. Continues to have symptoms of Tarsal tunnel syndrome which did improve with anti-pronation taping. Will continue to address global balance, LE strength, and sensation deficits moving forwards. She will benefit from skilled Physical Therapy intervention to address the moderate impairments listed below and to assist the patient in maximizing her functional independence and safe return to prior level of function.             Impairments: decreased endurance, pain, decreased strength, decreased range of motion and impaired motor control      Personal Factors/Comorbidities: 3+    Specific Comorbidities: Bronchiectasis, Asthma-COPD overlap syndrome, scoliosis, osteoporosis, sjogren's, abnormal gait, anxiety, leukopenia, grief    Examination of Body Systems: musculoskeletal, activity/participation and communication    Clinical Presentation: stable    Clinical Decision Making: low    Prognosis: good prognosis    Negative Prognosis Rationale: medical status/condition, chronicity of condition and severity of symptoms.      Therapy Goals      Goals:      1. In 12 weeks the patient will demonstrate independent performance of HEP to maintain functional gains.   2. In 12 weeks the patient will report avg pain < or =4/10  for improved quality of life. (6/10 on evaluation)  3. In 12 weeks the patient will demonstrate ability to ambulate 200 m continuously for improved community ambulation (currenlty reports difficulty walking > 100 ft).   4. In 12 weeks the patient will score a 10 point change on the ODI to demonstrate the MDC (0-100%; lower score indicates a lesser level of disability) and to indicate improved activity tolerance. (25/50 on initial evaluation)  5. In 12 weeks the patient will complete 5 independent sit to stand transfers in <10 seconds to demonstrate increased functional leg strength. (currently 39s for 5 STS with UE assist)        Plan    Therapy options: will be seen for skilled physical therapy services    Planned therapy interventions: Aquatic Therapy, Location manager, Education - Patient, Endurance Activites, Functional Mobility, Gait Training, Home Exercise Program, Manual Therapy, Neuromuscular Re-education, Therapeutic Activities, Therapeutic Exercises, Education - Family/Caregiver, Dry Needling, E-Stim, TENS, Self-Care/Home Training, Civil engineer, contracting and Diaphragmatic/Pursed-lip Breathing    DME Equipment: [employer].    Frequency: 1x week    Duration in weeks: 12    Education provided to: patient.    Education provided: HEP, Treatment options and plan, Symptom management, Safety education, Importance of  Therapy, Anatomy, Body mechanics, Role of therapy in Rehabilitation, Posture, Community resources and Body awareness    Education results: verbalized good understanding, demonstrates understanding and needs reinforcement.    Communication/Consultation: N/A.              SUBJECTIVE   Interpreter Use: Not applicable    History of Present Condition      History of Present Condition/Chief Complaint:       -LBP radiating down L LE x 2.5 months, has persisted since having shingles in May   -Constant Back pain; Leg pain intermittent    Sent to PT for back pain and balance deficits.  Reports constant back pain and intermittent leg pain/tingling since having shingles in May.  They found that there is deterioration in my spine. 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  Now also having shortness of breath, decreased stamina and is scheduled for Pulmonary rehab.  Feeling more off balance again.  Walks with the [facility] now, but had progressed to doing some walking without the walker after last bout of PT.  Now having back pain.    Referred for pulmonary rehab.  Back pain worse in the past few months.      Previous treatment- spinal tap seemed to make it worse  Falls- no falls in the past year- last one Aug 2022; has had home modifications to decrease risk for falls  Home-  Lives alone since her husband passed; Daughter [name] checks in daily  CLOF- ambulates 100 yards at most at a time; took breaks walking from the parking lot; needs to take breaks due to gait issues, back, & fatigue  Any current exercise?- stretching some, moving arms and legs a couple days week,  No regular walking or aerobic exercise currently- has had difficulty due to heat and air quality    Date of Onset:  03/23/2022  Date is an approximation?: yes  Explanation:  Back and L LE pain began after patient diagnosed with shingles in May  Subjective:     Really enjoyed the beach. Got tired on the sand. Had 14 stairs to do to get into the place at the beach. Pain in ankle/foot is getting worse. Just hurts around the ankle, not sure what is making it worse. Stretched it real good yesterday.       Pain  Current pain rating: 5  At best pain rating: 6  At worst pain rating: 8  Location: 6/10 back and 4-5/10 leg  Quality: aching, sore, numbing, tingling and tightness  Relieving factors: ice and heat (stretching, changing positions)  Aggravating factors: walking, when still, lifting and bending (uses grabber)  Pain Related Behaviors: avoidance (gardening, sits down to vacuum, sits down to mop)  Progression: improved  Red flags: none.    Precautions and Equipment  Precautions: Osteoporosis  Current Braces/Orthoses: None  Equipment Currently Used: Rolling walker, Wheelchair - manual and Single point cane (w/c for long distances; used more after her hip replacement, but  not much now)  Prior Functional Status:     Functional Limitation(s)-balance deficits, limited walking tolerance  Current functional status: limited household activities, limited standing tolerance, limited exercise, limited lifting, unsteady gait, limited recreation, limited walking tolerance and use of assistive device  Social Support  Lives in: [place] house (one step to enter without rail, 1/2 step)  Lives with: alone (daughter, heather checks in daily)  Hand dominance: right  Communication Preference: verbal,  written and visual  Barriers to Learning: No Barriers  Work/School: retired    Diagnostic Tests  X-ray: abnormal        Treatments  Current treatment: physical therapy      Patient Goals  Patient goals for therapy: decreased pain, improved ambulation, improved balance, increased strength and independence with ADLs/IADLs (able to do more, get stronger, keep the abilities that I have)      OBJECTIVE     Gait:  Ambulates with ROLLING WALKER with heavy reliance on UE's, needs cues to stay close to walker, B knees flexed, walks on toes, dragging feet more today    Tinel's at tarsal tunnel: + on L     TREATMENT RENDERED   Manual: 10 minutes  - Tinel's at tarsal tunnel positive on L  - Manual distraction  - Passive ankle DF - pt reports this is relieving of her symptoms   - Anti-pronation taping completed - pt reported with ambulation afterwards that she felt like she could feel the floor better afterwards     Therapeutic Exercise: 10 Minutes   Performed with direct PT demonstration, instruction, supervision, and guidance.   - Education on condition, prognosis, and PT POC  - Nustep seat 10, arms 9, lvl 1-3 x1 minutes total (machine stopped working)  - Seated toe to heel raises x20  - Seated marches with 2# ankle weights donned x20   - Overground ambulation with FWW x2 laps    With tape felt like she could feel the floor better  - Discussed that her current presentation of Tarsal Tunnel is likely not related to recent shingles infection     Neuro Re-education: 10 minutes  - Sharp/dull assessment - pt unable to feel any sharp sensations on either foot   - Loss light touch sensation along distal right foot from midfoot distally     Held:   - Supine lumbar rotations x20  - Supine glute stretch x5 with 15 sec hold   - Supine piriformis stretch actively x10 with 2-3 sec hold   - Bridge 3x12 - pt reports has not been doing this at home; unable to get through full ROM  - Adjusted HEP to have A and B days as pt has not been doing a lot of her HEP due to there being 6 exercises on it - dropped down to 3 exercises/day  - Supine clams blue resistance band 3x12 - pt reports no pain during the exercise, but has some shooting pain afterwards  - Supine heel taps with legs elevated off mat 3x12  - Walking laps for cool-down x2 - pt reports this felt better than the warmup laps     HEP Access Code: [id number]  Next Visit Plan: consider discussion on orthotics, injection at tarsal tunnel, shoewear (pt dislikes wearing shoes)    Total Treatment Time: 30 Minutes   Shortened session secondary to [facility] lock down and not being cleared to leave safe rooms for awhile          Therapeutic Interventions Charges  $$ Therapeutic Exercise [mins]: 10  $$ Manual Therapy [mins]: 10  $$ Neuromuscular Re-education [mins]: 10                 I attest that I have reviewed the above information.  Signed: Fidela Salisbury, PT, DPT  08/05/2022 3:30 PM        I reviewed the no-show/attendance policy with the patient and caregiver(s). The patient is aware that they must call to  cancel appointments more than 24 hours in advance. They are also aware that if they late cancel or no-show three times, we reserve the right to cancel their remaining appointments. This policy is in place to allow [place] to best serve the needs of our caseload.    If patient returns to clinic with variance in plan of care, then it may be attributable to one or more of the following factors: preferred clinician availability, appointment time request availability, therapy pool appointment availability, major holiday with clinic closure, caregiver availability, patient transportation, conflicting medical appointment, inclement weather, and/or patient illness.    If patient does not return for follow up visit(s) related to this episode of care, this note will serve as their discharge note from Physical Therapy. this note will serve as their discharge note from Physical Therapy.

## 2022-08-04 ENCOUNTER — Institutional Professional Consult (permissible substitution): Admit: 2022-08-04 | Discharge: 2022-08-05 | Payer: MEDICAID | Attending: Clinical | Primary: Clinical

## 2022-08-04 NOTE — Unmapped (Signed)
INTERNAL MEDICINE COUNSELING SESSION NOTE    LENGTH OF SESSION:  Psychotherapy Session 30 minutes    TYPE OF PSYCHOTHERAPY: Behavioral, Supportive    REASON FOR TREATMENT: Problem Solving Treatment and Mind Body Stress Reduction skill training for caregiver stress, mixed anxiety and depression; link impact of effort to mood.               The patient reports they are currently: at home. I spent 30 minutes on the phone with the patient on the date of service. I spent an additional 10 minutes on pre- and post-visit activities on the date of service    The patient was physically located in [place] or a state in which I am permitted to provide care. The patient and/or parent/guardian understood that s/he may incur co-pays and cost sharing, and agreed to the telemedicine visit. The visit was reasonable and appropriate under the circumstances given the patient's presentation at the time.    The patient and/or parent/guardian has been advised of the potential risks and limitations of this mode of treatment (including, but not limited to, the absence of in-person examination) and has agreed to be treated using telemedicine. The patient's/patient's family's questions regarding telemedicine have been answered.     If the visit was completed in an ambulatory setting, the patient and/or parent/guardian has also been advised to contact their provider???s office for worsening conditions, and seek emergency medical treatment and/or call 911 if the patient deems either necessary.              REVIEW OF FUNCTIONING SINCE LAST VISIT    MOOD (0-10) =    good, I just got back from the beach  Rating deferred    SATISFACTION WITH EFFORT IN CARING FOR MOOD (0-10) =    Rating deferred    PAIN:     6    At last visit was:  7/18.23L  6 - just got home from PT and residual shingles pain      PHQ-9 SCORE:     Rating deferred    GAD-7 SCORE:     Rating deferred      CURRENT SUICIDAL/HOMICIDAL IDEATION:  None      PROBLEM LIST:    Patient Active Problem List   Diagnosis    Abnormal gait    Anxiety    Arthralgia of hip    Family history of genetic disorder    Family history of malignant neoplasm of breast    Family history of malignant neoplasm of cervix    Mixed anxiety depressive disorder    Scoliosis    Sjogren's syndrome with keratoconjunctivitis sicca (CMS-HCC)    Leukopenia    Age-related nuclear cataract of both eyes    Poor sleep    Grief    Osteoporosis with current pathological fracture    Bronchiectasis without complication (CMS-HCC)    Asthma-COPD overlap syndrome (CMS-HCC)    Esophageal dysphagia         MEDICATION COMPLIANCE:     Taking all medications regularly?  yes     Any changes in medication since last visit?  yes - taking Xolir injection at home now every 28 days for allergies, and COPD with daughter's help.    Any missed doses? no       Current Outpatient Medications on File Prior to Visit   Medication Sig Dispense Refill    abaloparatide 80 mcg (3,120 mcg/1.56 mL) PnIj Inject 80 mcg under the skin daily. 1.56 mL 6  albuterol 2.5 mg /3 mL (0.083 %) nebulizer solution Inhale 3 mL (2.5 mg total) by nebulization every six (6) hours as needed for wheezing or shortness of breath. 360 mL 11    albuterol HFA 90 mcg/actuation inhaler Inhale 1 puff every four (4) hours as needed for wheezing. 8.5 g 11    budesonide-formoteroL (SYMBICORT) 160-4.5 mcg/actuation inhaler Inhale 2 puffs Two (2) times a day. 10.2 g 11    citalopram (CELEXA) 40 MG tablet Take 1 tablet (40 mg total) by mouth daily. 90 tablet 2    diclofenac sodium (VOLTAREN) 1 % gel Apply 2 g topically four (4) times a day. (Patient not taking: Reported on 07/28/2022) 100 g 0    empty container (SHARPS CONTAINER) Misc Use as directed 1 each 2    EPINEPHrine (EPIPEN) 0.3 mg/0.3 mL injection Inject 0.3 mL (0.3 mg total) into the muscle once as needed for anaphylaxis for up to 1 dose. 2 each 1    gabapentin (NEURONTIN) 100 MG capsule Take 1 capsule (100 mg) in the morning, 1 capsule (100 mg) in the afternoon and 2 capsules (200 mg) at night 120 capsule 3    hydrOXYchloroQUINE (PLAQUENIL) 200 mg tablet Take 1 tablet (200 mg total) by mouth daily. 90 tablet 3    hydrOXYzine (ATARAX) 25 MG tablet Take 1 tablet (25 mg total) by mouth two (2) times a day as needed. 60 tablet 2    ipratropium (ATROVENT) 0.02 % nebulizer solution Inhale the contents of 1 vial (500 mcg total) by nebulization Four (4) times a day. 62.5 mL 12    lidocaine (XYLOCAINE) 5 % ointment Apply topically daily. 141.76 g 0    montelukast (SINGULAIR) 10 mg tablet Take 1 tablet (10 mg total) by mouth daily. 90 tablet 3    omalizumab (XOLAIR) 150 mg/mL syringe Inject the contents of 2 syringes (300 mg total) under the skin every twenty-eight (28) days. 2 mL 11    omeprazole (PRILOSEC) 20 MG capsule Take 1 capsule (20 mg total) by mouth daily. 90 capsule 3    pen needle, diabetic 31 gauge x 3/16 (5 mm) Ndle Use one new needle each morning for injection 100 each 3    pilocarpine (SALAGEN) 5 MG tablet Take 1 tablet (5 mg total) by mouth Three (3) times a day. 270 tablet 3    traZODone (DESYREL) 50 MG tablet Take 0.5 tablets (25 mg total) by mouth nightly. 90 tablet 2    triamcinolone (KENALOG) 0.1 % cream Apply topically Two (2) times a day. Apply to back for itching 45 g 0     Current Facility-Administered Medications on File Prior to Visit   Medication Dose Route Frequency Provider Last Rate Last Admin    sodium chloride 3 % nebulizer solution 4 mL  4 mL Nebulization Once Ellin Mayhew, MD               SLEEPING:    pretty good Getting about 8 hours sleep.  Improved sleep since beach trip and earlier wake up time          APPETITE:    pretty good          SUBSTANCE USE:  none        SESSION FOCUS:  Addressed:  1. Grief    2. Mixed anxiety depressive disorder    3. Poor sleep        Discussed efforts since last contact.  She kept herself busy being out 9/5 with  a friend on what would've been her 42nd wedding anniversary  Improved sleep since beach trip and earlier wake up time  4 day beach trip, treat by her family  Daughter got her to take a ferry for an afternoon and lunch on [place] with [employer] of time on [address] - it was a Therapist, nutritional  They went to [employer] which she enjoyed  Hospital doctor to see [employer] from outside  She climbed 14 steps to [employer].  She is continuing to be more realistic with her expectations of herself and in setting limits with others.  Continuing with daily prayer and devotionals  Focus on gratitude rather than dwelling on what she can't do.  PT visits going well. She is doing PT for gait and balance in the morning and evening about 5 days a week. Continued pain and she is working with her medical team for further assessment and to address.        Today's Problem Solving addressed:    Reviewed patient's efforts and linked impact of effort to   1. Grief    2. Mixed anxiety depressive disorder    3. Poor sleep        Addressed:   Grief, mixed anxiety and depression, poor sleep     Stressors:  Health issues  Limited social support. (Primarily supported by older daughter [name].)  Husband with advanced dementia died in his sleep in the morning of 10/26/21  Patient, and her husband, Doreatha Martin, were together 41 years.  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  That night left for 4 day beach trip which helped keep her mind occupied.  First vacation she's had in a long time.  Last trip to the beach 4 yrs ago, she had to stay in the hotel and watch husband  Improved sleep since beach trip, earlier wake up time and more activity.    Both patient and older daughter have been working on Restaurant manager, fast food.  Because of interactions she felt she could rely on younger daughter, Tresa Endo, to watch her dogs and collect her mail, while patient was at the beach.  Dogs did well under Kelly's care.  She and family talking about future possibilities for travel/move.  She is continuing to be more realistic with her expectations of herself and in setting limits with others.  Discussed meaning and benefit of recent experiences.    Improved mood overall.    Concerns about pain and lack of sensation in one leg.  Working with PT and medical team.    Reviewed tx and progress to date and relevance of efforts.  I'm not afraid anymore. That's what this therapy has helped me with.          Additional:     Medication adherence and barriers to the treatment plan have been addressed. Opportunities to optimize healthy behaviors have been discussed. Patient / caregiver voiced understanding.                 ASSESSMENT      ENGAGEMENT: Patient presented a willingness to participate in treatment.  PARTICIPATION QUALITY:    Active  MOOD:  good     AFFECT:  CONGRUENT   MENTAL STATUS:  alert and oriented  MODES OF INTERVENTION used in this session:    Clarification, Exploration, Problem Solving, Support, Reframing, or Week review      TREATMENT PLAN    Short term goal focused counseling for identified treatment goals:   [x]  Reduce PHQ / GAD by 5 points  []  Get or keep PHQ / GAD under 10 points  [x]  Stress and self-care management;   []  Referral for outside treatment  []  Bridge to outside treatment  [x]  Other:   Coping skills, resource information, address anticipatory grief     Patient-stated health goal:   Coping skills to get thru the day when I???m totally frustrated by others and what they say and do. Coping skills to overcome the anxiousness.       Next visit will be   5 of up to 12 visits.     Return PHONE visit Tue 10/17 at 1pm

## 2022-08-14 ENCOUNTER — Ambulatory Visit: Admit: 2022-08-14 | Discharge: 2022-09-06 | Payer: MEDICAID

## 2022-08-14 ENCOUNTER — Ambulatory Visit: Admit: 2022-08-14 | Payer: MEDICAID

## 2022-08-14 NOTE — Unmapped (Signed)
[facility] PT [facility] [place]  OUTPATIENT PHYSICAL THERAPY  08/14/2022  Note Type: Treatment Note       Patient Name: Christy Ibarra  Date of Birth:1955-12-10  Diagnosis:   Encounter Diagnoses   Name Primary?    Chronic midline low back pain with bilateral sciatica Yes    Sjogren's syndrome with keratoconjunctivitis sicca (CMS-HCC)     Risk for falls      Referring Provider: Johnsie Cancel    Date of Onset of Impairment: No date available  Date PT Care Plan Established or Reviewed: No date available  Date PT Treatment Started: No date available     Plan of Care Effective Date: 06/09/22 to 09/09/22  Session Number:  7 (5/8 covered 07/08/22-09/01/22)    ASSESSMENT & PLAN   Assessment  Assessment details:    Fatiha is a pleasant 66 y.o. female who presents for Physical Therapy follow-up with LBP/radicular pain. She also presents with impaired balance, gait impairment, and increased risk for falls. Her foot/ankle pain consistent with tarsal tunnel has improved with use of anti-pronation taping. She was provided extra tape to take home until she purchases some of her own. She was able to tolerate LE strengthening on the machines today with only some muscle fatigue. We went through seated chair aerobics to address cardiovascular fitness, with pt becoming tired and SOB in 10 minutes. She will benefit from skilled Physical Therapy intervention to address the moderate impairments listed below and to assist the patient in maximizing her functional independence and safe return to prior level of function.             Impairments: decreased endurance, pain, decreased strength, decreased range of motion and impaired motor control      Personal Factors/Comorbidities: 3+    Specific Comorbidities: Bronchiectasis, Asthma-COPD overlap syndrome, scoliosis, osteoporosis, sjogren's, abnormal gait, anxiety, leukopenia, grief    Examination of Body Systems: musculoskeletal, activity/participation and communication    Clinical Presentation: stable    Clinical Decision Making: low    Prognosis: good prognosis    Negative Prognosis Rationale: medical status/condition, chronicity of condition and severity of symptoms.      Therapy Goals      Goals:      1. In 12 weeks the patient will demonstrate independent performance of HEP to maintain functional gains.   2. In 12 weeks the patient will report avg pain < or =4/10  for improved quality of life. (6/10 on evaluation)  3. In 12 weeks the patient will demonstrate ability to ambulate 200 m continuously for improved community ambulation (currenlty reports difficulty walking > 100 ft).   4. In 12 weeks the patient will score a 10 point change on the ODI to demonstrate the MDC (0-100%; lower score indicates a lesser level of disability) and to indicate improved activity tolerance. (25/50 on initial evaluation)  5. In 12 weeks the patient will complete 5 independent sit to stand transfers in <10 seconds to demonstrate increased functional leg strength. (currently 39s for 5 STS with UE assist)        Plan    Therapy options: will be seen for skilled physical therapy services    Planned therapy interventions: Aquatic Therapy, Location manager, Education - Patient, Endurance Activites, Functional Mobility, Gait Training, Home Exercise Program, Manual Therapy, Neuromuscular Re-education, Therapeutic Activities, Therapeutic Exercises, Education - Family/Caregiver, Dry Needling, E-Stim, TENS, Self-Care/Home Training, Civil engineer, contracting and Diaphragmatic/Pursed-lip Breathing    DME Equipment: [employer].    Frequency: 1x week  Duration in weeks: 12    Education provided to: patient.    Education provided: HEP, Treatment options and plan, Symptom management, Safety education, Importance of Therapy, Anatomy, Body mechanics, Role of therapy in Rehabilitation, Posture, Community resources and Body awareness    Education results: verbalized good understanding, demonstrates understanding and needs reinforcement. Communication/Consultation: N/A.              SUBJECTIVE   Interpreter Use: Not applicable    History of Present Condition      History of Present Condition/Chief Complaint:       -LBP radiating down L LE x 2.5 months, has persisted since having shingles in May   -Constant Back pain; Leg pain intermittent    Sent to PT for back pain and balance deficits.  Reports constant back pain and intermittent leg pain/tingling since having shingles in May.  They found that there is deterioration in my spine. 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  Now also having shortness of breath, decreased stamina and is scheduled for Pulmonary rehab.  Feeling more off balance again.  Walks with the [facility] now, but had progressed to doing some walking without the walker after last bout of PT.  Now having back pain.    Referred for pulmonary rehab.  Back pain worse in the past few months.      Previous treatment- spinal tap seemed to make it worse  Falls- no falls in the past year- last one Aug 2022; has had home modifications to decrease risk for falls  Home-  Lives alone since her husband passed; Daughter [name] checks in daily  CLOF- ambulates 100 yards at most at a time; took breaks walking from the parking lot; needs to take breaks due to gait issues, back, & fatigue  Any current exercise?- stretching some, moving arms and legs a couple days week,  No regular walking or aerobic exercise currently- has had difficulty due to heat and air quality    Date of Onset:  03/23/2022  Date is an approximation?: yes  Explanation:  Back and L LE pain began after patient diagnosed with shingles in May  Subjective:     Tape really helped. Has noticed improvement since switching to shoes with more arch support. Planning to talk to doctor about getting orthotics and referral to podiatry.       Pain  Current pain rating: 5  At best pain rating: 6  At worst pain rating: 8  Location: 6/10 back and 4-5/10 leg  Quality: aching, sore, numbing, tingling and tightness  Relieving factors: ice and heat (stretching, changing positions)  Aggravating factors: walking, when still, lifting and bending (uses grabber)  Pain Related Behaviors: avoidance (gardening, sits down to vacuum, sits down to mop)  Progression: improved  Red flags: none.    Precautions and Equipment  Precautions: Osteoporosis  Current Braces/Orthoses: None  Equipment Currently Used: Rolling walker, Wheelchair - manual and Single point cane (w/c for long distances; used more after her hip replacement, but  not much now)  Prior Functional Status:     Functional Limitation(s)-balance deficits, limited walking tolerance  Current functional status: limited household activities, limited standing tolerance, limited exercise, limited lifting, unsteady gait, limited recreation, limited walking tolerance and use of assistive device  Social Support  Lives in: [place] house (one step to enter without rail, 1/2 step)  Lives with: alone (daughter, heather checks in daily)  Hand dominance:  right  Communication Preference: verbal, written and visual  Barriers to Learning: No Barriers  Work/School: retired    Diagnostic Tests  X-ray: abnormal        Treatments  Current treatment: physical therapy      Patient Goals  Patient goals for therapy: decreased pain, improved ambulation, improved balance, increased strength and independence with ADLs/IADLs (able to do more, get stronger, keep the abilities that I have)      OBJECTIVE     Gait:  Ambulates with ROLLING WALKER with heavy reliance on UE's, needs cues to stay close to walker, B knees flexed, walks on toes, dragging feet more today    Tinel's at tarsal tunnel: + on L     TREATMENT RENDERED   Manual: 5 minutes  - Anti-pronation taping on L with kinesiotape completed - pt reported with ambulation afterwards that she felt like she could feel the floor better afterwards     Therapeutic Exercise: 40 Minutes Performed with direct PT demonstration, instruction, supervision, and guidance.   - Education on condition, prognosis, and PT POC  - Leg press 20# 4x6 - pt reports this is a little hard - increased to 30# on last set   - Hamstring curl 40# 3x6   - Knee extension 20# 3x6    Added towel roll to buffer shin pad as pt reported some discomfort there  - Mini squats at balance bar 3x12 - started with B UE support, decreased to single UE support as able  - Chair aerobics x10 minutes   Taught pt exercises she can complete at home   Seated marches with alternating overhead arm raises x2 minutes   Seated knee extension heel taps x2 minutes - tried to add elbow flexion/extension, but too challenging coordination wise   Seated bicep curls 1# DB x2 minutes   Seated trunk lateral flexion/side bends and returns x2 minutes - pt started to become slightly SOB with this one    Seated hip abduction/adduction while pulsing arms forwards/backwards x2 minutes - challenged by coordination needs    Held:   - Supine lumbar rotations x20  - Supine glute stretch x5 with 15 sec hold   - Supine piriformis stretch actively x10 with 2-3 sec hold   - Bridge 3x12 - pt reports has not been doing this at home; unable to get through full ROM  - Adjusted HEP to have A and B days as pt has not been doing a lot of her HEP due to there being 6 exercises on it - dropped down to 3 exercises/day  - Supine clams blue resistance band 3x12 - pt reports no pain during the exercise, but has some shooting pain afterwards  - Supine heel taps with legs elevated off mat 3x12  - Walking laps for cool-down x2 - pt reports this felt better than the warmup laps     HEP Access Code: [id number]  Next Visit Plan: consider discussion on orthotics, injection at tarsal tunnel, shoewear (pt dislikes wearing shoes)    Total Treatment Time: 45 Minutes   Shortened session secondary to [facility] lock down and not being cleared to leave safe rooms for awhile          Therapeutic Interventions Charges  $$ Therapeutic Exercise [mins]: 40                 I attest that I have reviewed the above information.  Signed: Fidela Salisbury, PT, DPT  08/14/2022 12:23 PM  I reviewed the no-show/attendance policy with the patient and caregiver(s). The patient is aware that they must call to cancel appointments more than 24 hours in advance. They are also aware that if they late cancel or no-show three times, we reserve the right to cancel their remaining appointments. This policy is in place to allow [place] to best serve the needs of our caseload.    If patient returns to clinic with variance in plan of care, then it may be attributable to one or more of the following factors: preferred clinician availability, appointment time request availability, therapy pool appointment availability, major holiday with clinic closure, caregiver availability, patient transportation, conflicting medical appointment, inclement weather, and/or patient illness.    If patient does not return for follow up visit(s) related to this episode of care, this note will serve as their discharge note from Physical Therapy.

## 2022-08-17 ENCOUNTER — Ambulatory Visit: Admit: 2022-08-17 | Discharge: 2022-08-18 | Payer: MEDICAID | Attending: Internal Medicine | Primary: Internal Medicine

## 2022-08-17 DIAGNOSIS — Z23 Encounter for immunization: Principal | ICD-10-CM

## 2022-08-17 DIAGNOSIS — Z1231 Encounter for screening mammogram for malignant neoplasm of breast: Principal | ICD-10-CM

## 2022-08-17 DIAGNOSIS — Z1211 Encounter for screening for malignant neoplasm of colon: Principal | ICD-10-CM

## 2022-08-17 DIAGNOSIS — B0229 Other postherpetic nervous system involvement: Principal | ICD-10-CM

## 2022-08-17 DIAGNOSIS — M79672 Pain in left foot: Principal | ICD-10-CM

## 2022-08-17 MED ORDER — PREGABALIN 25 MG CAPSULE
ORAL_CAPSULE | Freq: Two times a day (BID) | ORAL | 1 refills | 90 days | Status: CP
Start: 2022-08-17 — End: 2023-08-17
  Filled 2022-08-20: qty 180, 90d supply, fill #0

## 2022-08-17 NOTE — Unmapped (Addendum)
GET YOUR FIT TEST DONE!!!!!!!!!!!!!!!!!!!!!!!!          Schedule your mammogram

## 2022-08-17 NOTE — Unmapped (Signed)
Internal Medicine Clinic Visit    Reason for Visit: Postherpetic neuralgia and foot pain.    A/P:    1. Postherpetic neuralgia  We will try Lyrica instead of gabapentin.   - pregabalin (LYRICA) 25 MG capsule; Take 1 capsule (25 mg total) by mouth Two (2) times a day.  Dispense: 180 capsule; Refill: 1    2. Left foot pain  PT with concern for tarsal tunnel syndrome and high arches, recommending podiatry referral.  - Ambulatory referral to Podiatry; Future    3. Screen for colon cancer  - Immunochemical Fecal Occult Blood Test (FIT), automated; Future    4. Visit for screening mammogram  - Mammo Digital Screening Bilateral; Future    5. Need for immunization against influenza  - INFLUENZA VACCINE (QUAD) IM - 6 MO-ADULT - PF    Healthcare Maintenance  Cancer screening:  Colon: Reminded to get FIT test done  Cervical: done on 11/2020, no abnormalities, will repeat in 2027   Breast: Repeat due and ordered  Lung: Last done 06/2022     Immunizations:  - Flu shot: Given today   - Pneumonia: UTD  - Shingrix: Instructed to get from pharmacy  - Tdap/Td: Due 2032  - COVID: UTD     Other:  - HIV screen: Non-reactive in 2022  - DEXA scan: will address at next visit  - HCV: Non-reactive in 2022  - Hemoglobin A1C: 5.4 in 2020  - Lipid panel: Last done 2022    Return in about 6 months (around 02/15/2023).  __________________________________________________________    HPI:    66 year old with a history of Sjogren's syndrome, anxiety/depression/PTSD, leukopenia, gait instability (presumed transverse myelitis), osteoporosis, bronchiectasis and asthma/COPD overlap who presents today for follow up.     She is interested in changing medications. Gabapentin - taking 200 mg at night but makes her sleepy. If she takes during the day, it makes her sleepy and worried about being a fall risk. Still having pain in the back of her thigh. No longer fiery but it is constant (rubbing and friction and vibrating feeling). 100 mg in the daytime made her sleepy. Pain is a 5-6 every day. Ice seems to help relieve pain for a little bit.     Interested in podiatry referral. PT mentioned tarsal tunnel syndrome and thought podiatry could be helpful.     Quit smoking in 2009. 2 packs per day for 25 years.   __________________________________________________________    Problem List:  Patient Active Problem List   Diagnosis    Abnormal gait    Anxiety    Arthralgia of hip    Family history of genetic disorder    Family history of malignant neoplasm of breast    Family history of malignant neoplasm of cervix    Mixed anxiety depressive disorder    Scoliosis    Sjogren's syndrome with keratoconjunctivitis sicca (CMS-HCC)    Leukopenia    Age-related nuclear cataract of both eyes    Poor sleep    Grief    Osteoporosis with current pathological fracture    Bronchiectasis without complication (CMS-HCC)    Asthma-COPD overlap syndrome (CMS-HCC)    Esophageal dysphagia       Medications:  Reviewed in EPIC  __________________________________________________________    Physical Exam:   Vital Signs:  Vitals:    08/17/22 1409   BP: 102/75   BP Site: R Arm   BP Position: Sitting   BP Cuff Size: Medium   Pulse: 75  Temp: 36 ??C (96.8 ??F)   TempSrc: Temporal   SpO2: 97%   Weight: 62.5 kg (137 lb 12.8 oz)   Height: 173.4 cm (5' 8.27)      Body mass index is 20.79 kg/m??.    Gen: Appears at baseline  CV: RRR, no murmurs  Pulm: CTA bilaterally, no crackles or wheezes  Ext: No edema

## 2022-08-17 NOTE — Unmapped (Deleted)
Internal Medicine Clinic Visit    Reason for Visit:     A/P:     Reason for Visit: Follow up      A/P:     1. Chronic midline low back pain with bilateral sciatica  Ongoing for years. H/o hip fracture in 2022. She is going to PT, and has only had the entry visit. She has future visits scheduled.  - Naproxen 250 mg BID  - Consider switching Celexa to SNRI in future (of note, did not tolerate Cymbalta in the past)   lumbar MRI and lumbar epidural steroid injection pending MRI if symptoms worsen or fail to improve after 6 weeks of physical therapy     2. Herpes Zoster - Subacute Herpetic Neuralgia  Seen in May 2023 and diagnosed with herpes zoster to left posterior thigh. Lesions healed but still having numbness to the area. Improving with Gabapentin 100mg  TID.   - 100mg , 100mg , 200mg . Increase night dose due to worse symptoms at night.     2. Chronic pain of both knees  Not bothersome to her at the moment, states she thinks the shingles exacerbated it.   Knees are okay, not using voltaren gel right now     3. Bilateral Hand Weakness  MRI cervical spine in 2023 with disc bulge at C3-C4, C4-C5, C5-C6, and C6-C7. This may be due to radiculopathy. She reports weakness in both hands that presents mainly as difficulty opening jars. Doing exercises at home     4. Esophageal dysphagia  Referred to GI, appointment in September 2023. EGD without explanation for dysphagia.  - Continue Omeprazole 20mg       Healthcare Maintenance  Cancer screening:  Colon: FIT ordered per patient preference   Cervical: done on 11/2020, no abnormalities, will repeat in 2027   Breast: most recent done on 02/2021; repeat 02/2022   Lung: will follow-up at next visit (recent CT from Pulm but need to verify smoking history)     Immunizations:  - Flu shot: UTD  - Pneumonia: Pneumovax 23 given 05/2020  - Shingrix: Instructed to get from pharmacy  - Tdap/Td: Due 2032  - COVID: UTD     Other:  - HIV screen: Non-reactive in 2022  - DEXA scan: will address at next visit  - HCV: Non-reactive in 2022  - Hemoglobin A1C: 5.4 in 2020  - Lipid panel: Last done 2022    No follow-ups on file.      I personally spent *** minutes face-to-face and non-face-to-face in the care of this patient, which includes all pre, intra, and post visit time on the date of service.      __________________________________________________________    HPI:    66 year old with a history of Sjogren's syndrome, anxiety/depression/PTSD, leukopenia, gait instability (presumed transverse myelitis), osteoporosis, bronchiectasis and asthma/COPD overlap who presents today for follow up.           ***  __________________________________________________________    Problem List:  Patient Active Problem List   Diagnosis    Abnormal gait    Anxiety    Arthralgia of hip    Family history of genetic disorder    Family history of malignant neoplasm of breast    Family history of malignant neoplasm of cervix    Mixed anxiety depressive disorder    Scoliosis    Sjogren's syndrome with keratoconjunctivitis sicca (CMS-HCC)    Leukopenia    Age-related nuclear cataract of both eyes    Poor sleep    Grief  Osteoporosis with current pathological fracture    Bronchiectasis without complication (CMS-HCC)    Asthma-COPD overlap syndrome (CMS-HCC)    Esophageal dysphagia       Medications:  Reviewed in EPIC  __________________________________________________________    Physical Exam:   Vital Signs:  There were no vitals filed for this visit.   There is no height or weight on file to calculate BMI.    Gen: Well appearing, NAD  CV: RRR, no murmurs  Pulm: CTA bilaterally, no crackles or wheezes  Abd: Soft, NTND, normal BS. No HSM.  Ext: No edema

## 2022-08-19 NOTE — Unmapped (Signed)
08/19/22-per Kris Mouton from clinic advised pt will now be clinically administering from home will enroll in Allergy & Asthma-[facility]

## 2022-08-20 DIAGNOSIS — J449 Chronic obstructive pulmonary disease, unspecified: Principal | ICD-10-CM

## 2022-08-20 MED ORDER — SPIRIVA RESPIMAT 2.5 MCG/ACTUATION SOLUTION FOR INHALATION
Freq: Every day | RESPIRATORY_TRACT | 11 refills | 30 days | Status: CP
Start: 2022-08-20 — End: 2023-08-20
  Filled 2022-09-22: qty 4, 30d supply, fill #0

## 2022-08-20 MED FILL — SYMBICORT 160 MCG-4.5 MCG/ACTUATION HFA AEROSOL INHALER: RESPIRATORY_TRACT | 30 days supply | Qty: 10.2 | Fill #3

## 2022-08-20 NOTE — Unmapped (Signed)
Ms. Milleson states she has 2 doses (4 syringes) of Xolair at home and enough Tymlos to last until December. She confirmed her next dose of Xolair is due 10/9. I will reschedule outreach call in 6 weeks to schedule next Xolair delivery.     Oliva Bustard, PharmD  [facility]  [phone] (opt 4, then opt 2)

## 2022-08-21 NOTE — Unmapped (Unsigned)
[facility] PT [facility] [place]  OUTPATIENT PHYSICAL THERAPY  08/21/2022          Patient Name: Christy Ibarra  Date of Birth:06/25/1956  Diagnosis:   No diagnosis found.    Referring Provider:      Date of Onset of Impairment: No date available  Date PT Care Plan Established or Reviewed: No date available  Date PT Treatment Started: No date available     Plan of Care Effective Date: 06/09/22 to 09/09/22  Session Number:  8 (6/8 covered 07/08/22-09/01/22)    ASSESSMENT & PLAN   Assessment  Assessment details:    Leelah is a pleasant 66 y.o. female who presents for Physical Therapy follow-up with LBP/radicular pain. She also presents with impaired balance, gait impairment, and increased risk for falls. Her foot/ankle pain consistent with tarsal tunnel has improved with use of anti-pronation taping. She was provided extra tape to take home until she purchases some of her own. She was able to tolerate LE strengthening on the machines today with only some muscle fatigue. We went through seated chair aerobics to address cardiovascular fitness, with pt becoming tired and SOB in 10 minutes. She will benefit from skilled Physical Therapy intervention to address the moderate impairments listed below and to assist the patient in maximizing her functional independence and safe return to prior level of function.             Impairments: decreased endurance, pain, decreased strength, decreased range of motion and impaired motor control      Personal Factors/Comorbidities: 3+    Specific Comorbidities: Bronchiectasis, Asthma-COPD overlap syndrome, scoliosis, osteoporosis, sjogren's, abnormal gait, anxiety, leukopenia, grief    Examination of Body Systems: musculoskeletal, activity/participation and communication    Clinical Presentation: stable    Clinical Decision Making: low    Prognosis: good prognosis    Negative Prognosis Rationale: medical status/condition, chronicity of condition and severity of symptoms.      Therapy Goals Goals:      1. In 12 weeks the patient will demonstrate independent performance of HEP to maintain functional gains.   2. In 12 weeks the patient will report avg pain < or =4/10  for improved quality of life. (6/10 on evaluation)  3. In 12 weeks the patient will demonstrate ability to ambulate 200 m continuously for improved community ambulation (currenlty reports difficulty walking > 100 ft).   4. In 12 weeks the patient will score a 10 point change on the ODI to demonstrate the MDC (0-100%; lower score indicates a lesser level of disability) and to indicate improved activity tolerance. (25/50 on initial evaluation)  5. In 12 weeks the patient will complete 5 independent sit to stand transfers in <10 seconds to demonstrate increased functional leg strength. (currently 39s for 5 STS with UE assist)        Plan    Therapy options: will be seen for skilled physical therapy services    Planned therapy interventions: Aquatic Therapy, Location manager, Education - Patient, Endurance Activites, Functional Mobility, Gait Training, Home Exercise Program, Manual Therapy, Neuromuscular Re-education, Therapeutic Activities, Therapeutic Exercises, Education - Family/Caregiver, Dry Needling, E-Stim, TENS, Self-Care/Home Training, Civil engineer, contracting and Diaphragmatic/Pursed-lip Breathing    DME Equipment: [employer].    Frequency: 1x week    Duration in weeks: 12    Education provided to: patient.    Education provided: HEP, Treatment options and plan, Symptom management, Safety education, Importance of Therapy, Anatomy, Body mechanics, Role of therapy in Rehabilitation, Posture, Community  resources and Body awareness    Education results: verbalized good understanding, demonstrates understanding and needs reinforcement.    Communication/Consultation: N/A.              SUBJECTIVE   Interpreter Use: Not applicable    History of Present Condition      History of Present Condition/Chief Complaint:       -LBP radiating down L LE x 2.5 months, has persisted since having shingles in May   -Constant Back pain; Leg pain intermittent    Sent to PT for back pain and balance deficits.  Reports constant back pain and intermittent leg pain/tingling since having shingles in May.  They found that there is deterioration in my spine. 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  Now also having shortness of breath, decreased stamina and is scheduled for Pulmonary rehab.  Feeling more off balance again.  [facility], but had progressed to doing some walking without the walker after last bout of PT.  Now having back pain.    Referred for pulmonary rehab.  Back pain worse in the past few months.      Previous treatment- spinal tap seemed to make it worse  Falls- no falls in the past year- last one Aug 2022; has had home modifications to decrease risk for falls  Home-  Lives alone since her husband passed; Daughter [name] checks in daily  CLOF- ambulates 100 yards at most at a time; took breaks walking from the parking lot; needs to take breaks due to gait issues, back, & fatigue  Any current exercise?- stretching some, moving arms and legs a couple days week,  No regular walking or aerobic exercise currently- has had difficulty due to heat and air quality    Date of Onset:  03/23/2022  Date is an approximation?: yes  Explanation:  Back and L LE pain began after patient diagnosed with shingles in May  Subjective:     Tape really helped. Has noticed improvement since switching to shoes with more arch support. Planning to talk to doctor about getting orthotics and referral to podiatry.       Pain  Current pain rating: 5  At best pain rating: 6  At worst pain rating: 8  Location: 6/10 back and 4-5/10 leg  Quality: aching, sore, numbing, tingling and tightness  Relieving factors: ice and heat (stretching, changing positions)  Aggravating factors: walking, when still, lifting and bending (uses grabber)  Pain Related Behaviors: avoidance (gardening, sits down to vacuum, sits down to mop)  Progression: improved  Red flags: none.    Precautions and Equipment  Precautions: Osteoporosis  Current Braces/Orthoses: None  Equipment Currently Used: Rolling walker, Wheelchair - manual and Single point cane (w/c for long distances; used more after her hip replacement, but  not much now)  Prior Functional Status:     Functional Limitation(s)-balance deficits, limited walking tolerance  Current functional status: limited household activities, limited standing tolerance, limited exercise, limited lifting, unsteady gait, limited recreation, limited walking tolerance and use of assistive device  Social Support  Lives in: [place] (one step to enter without rail, 1/2 step)  Lives with: alone (daughter, heather checks in daily)  Hand dominance: right  Communication Preference: verbal, written and visual  Barriers to Learning: No Barriers  Work/School: retired    Diagnostic Tests  X-ray: abnormal        Treatments  Current  treatment: physical therapy      Patient Goals  Patient goals for therapy: decreased pain, improved ambulation, improved balance, increased strength and independence with ADLs/IADLs (able to do more, get stronger, keep the abilities that I have)      OBJECTIVE     Gait:  Ambulates with ROLLING WALKER with heavy reliance on UE's, needs cues to stay close to walker, B knees flexed, walks on toes, dragging feet more today    Tinel's at tarsal tunnel: + on L     TREATMENT RENDERED   Manual: 5 minutes  - Anti-pronation taping on L with kinesiotape completed - pt reported with ambulation afterwards that she felt like she could feel the floor better afterwards     Therapeutic Exercise: 40 Minutes   Performed with direct PT demonstration, instruction, supervision, and guidance.   - Education on condition, prognosis, and PT POC  - Leg press 20# 4x6 - pt reports this is a little hard - increased to 30# on last set   - Hamstring curl 40# 3x6   - Knee extension 20# 3x6    Added towel roll to buffer shin pad as pt reported some discomfort there  - Mini squats at balance bar 3x12 - started with B UE support, decreased to single UE support as able  - Chair aerobics x10 minutes   Taught pt exercises she can complete at home   Seated marches with alternating overhead arm raises x2 minutes   Seated knee extension heel taps x2 minutes - tried to add elbow flexion/extension, but too challenging coordination wise   Seated bicep curls 1# DB x2 minutes   Seated trunk lateral flexion/side bends and returns x2 minutes - pt started to become slightly SOB with this one    Seated hip abduction/adduction while pulsing arms forwards/backwards x2 minutes - challenged by coordination needs    Held:   - Supine lumbar rotations x20  - Supine glute stretch x5 with 15 sec hold   - Supine piriformis stretch actively x10 with 2-3 sec hold   - Bridge 3x12 - pt reports has not been doing this at home; unable to get through full ROM  - Adjusted HEP to have A and B days as pt has not been doing a lot of her HEP due to there being 6 exercises on it - dropped down to 3 exercises/day  - Supine clams blue resistance band 3x12 - pt reports no pain during the exercise, but has some shooting pain afterwards  - Supine heel taps with legs elevated off mat 3x12  - Walking laps for cool-down x2 - pt reports this felt better than the warmup laps     HEP Access Code: [id number]  Next Visit Plan: consider discussion on orthotics, injection at tarsal tunnel, shoewear (pt dislikes wearing shoes)    Total Treatment Time: 45 Minutes   Shortened session secondary to [facility] lock down and not being cleared to leave safe rooms for awhile                            I attest that I have reviewed the above information.  Signed: Fidela Salisbury, PT, DPT  08/21/2022 8:25 AM        I reviewed the no-show/attendance policy with the patient and caregiver(s). The patient is aware that they must call to cancel appointments more than 24 hours in advance. They are also aware that if they late cancel or  no-show three times, we reserve the right to cancel their remaining appointments. This policy is in place to allow [place] to best serve the needs of our caseload.    If patient returns to clinic with variance in plan of care, then it may be attributable to one or more of the following factors: preferred clinician availability, appointment time request availability, therapy pool appointment availability, major holiday with clinic closure, caregiver availability, patient transportation, conflicting medical appointment, inclement weather, and/or patient illness.    If patient does not return for follow up visit(s) related to this episode of care, this note will serve as their discharge note from Physical Therapy.

## 2022-08-26 NOTE — Unmapped (Signed)
[facility] PT [facility] [place]  OUTPATIENT PHYSICAL THERAPY  08/26/2022  Note Type: Re-evaluation/Progress Note  Note Date Range: 06/09/22 - 08/26/22    Patient Name: Christy Ibarra  Date of Birth:03/23/1956  Diagnosis:   Encounter Diagnoses   Name Primary?    Chronic midline low back pain with bilateral sciatica Yes    Sjogren's syndrome with keratoconjunctivitis sicca (CMS-HCC)     Risk for falls      Referring Provider: Johnsie Cancel    Date of Onset of Impairment: 11/23/2018  Date PT Care Plan Established or Reviewed: 08/26/2022  Date PT Treatment Started: 06/09/2022     Plan of Care Effective Date: 06/09/22 to 09/09/22  Session Number:  8 (6/8 covered 07/08/22-09/01/22)  submitted for more visits to start 10/12    ASSESSMENT & PLAN   Assessment  Assessment details:    Christy Ibarra  presents for Physical Therapy follow-up with LBP/radicular pain. She also presents with impaired balance, gait impairment, and increased risk for falls. Her foot/ankle pain consistent with tarsal tunnel has improved with use of anti-pronation taping. She is progressing towards her goals. She improved her walking distance from 100 to 240 ft and decreased her 5x STS by >10s.  She will benefit from skilled Physical Therapy intervention to address the moderate impairments listed below and to assist the patient in maximizing her functional independence and safe return to prior level of function.             Impairments: decreased endurance, pain, decreased strength, decreased range of motion and impaired motor control      Personal Factors/Comorbidities: 3+    Specific Comorbidities: Bronchiectasis, Asthma-COPD overlap syndrome, scoliosis, osteoporosis, sjogren's, abnormal gait, anxiety, leukopenia, grief    Examination of Body Systems: musculoskeletal, activity/participation and communication    Clinical Presentation: stable    Clinical Decision Making: low    Prognosis: good prognosis    Negative Prognosis Rationale: medical status/condition, chronicity of condition and severity of symptoms.      Therapy Goals      Goals:      1. In 12 weeks the patient will demonstrate independent performance of HEP to maintain functional gains. - PROGRESSING 08/26/22  2. In 12 weeks the patient will report avg pain < or =4/10  for improved quality of life. - PROGRESSING 08/26/22 (stays 5-6 in midline low back especially with activity and squats)  3. In 12 weeks the patient will demonstrate ability to ambulate 200 m continuously for improved community ambulation. (240 ft). - PROGRESSING 08/26/22  4. In 12 weeks the patient will score a 10 point change on the ODI to demonstrate the MDC (0-100%; lower score indicates a lesser level of disability) and to indicate improved activity tolerance.  - PROGRESSING 08/26/22  5. In 12 weeks the patient will complete 5 independent sit to stand transfers in <10 seconds to demonstrate increased functional leg strength.  - PROGRESSING 08/26/22           Plan    Therapy options: will be seen for skilled physical therapy services    Planned therapy interventions: Aquatic Therapy, Balance Training, Education - Patient, Endurance Activites, Functional Mobility, Gait Training, Home Exercise Program, Manual Therapy, Neuromuscular Re-education, Therapeutic Activities, Therapeutic Exercises, Education - Family/Caregiver, Dry Needling, E-Stim, TENS, Self-Care/Home Training, Civil engineer, contracting and Diaphragmatic/Pursed-lip Breathing    DME Equipment: [employer].    Frequency: 1x week    Duration in weeks: 12    Education provided to: patient.  Education provided: HEP, Treatment options and plan, Symptom management, Safety education, Importance of Therapy, Anatomy, Body mechanics, Role of therapy in Rehabilitation, Posture, Community resources and Body awareness    Education results: verbalized good understanding, demonstrates understanding and needs reinforcement.    Communication/Consultation: N/A.              SUBJECTIVE   Interpreter Use: Not applicable    History of Present Condition      History of Present Condition/Chief Complaint:       -LBP radiating down L LE x 2.5 months, has persisted since having shingles in May   -Constant Back pain; Leg pain intermittent    Sent to PT for back pain and balance deficits.  Reports constant back pain and intermittent leg pain/tingling since having shingles in May.  They found that there is deterioration in my spine. 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  Now also having shortness of breath, decreased stamina and is scheduled for Pulmonary rehab.  Feeling more off balance again.  Walks with the [facility] now, but had progressed to doing some walking without the walker after last bout of PT.  Now having back pain.    Referred for pulmonary rehab.  Back pain worse in the past few months.      Previous treatment- spinal tap seemed to make it worse  Falls- no falls in the past year- last one Aug 2022; has had home modifications to decrease risk for falls  Home-  Lives alone since her husband passed; Daughter Christy Ibarra checks in daily  CLOF- ambulates 100 yards at most at a time; took breaks walking from the parking lot; needs to take breaks due to gait issues, back, & fatigue  Any current exercise?- stretching some, moving arms and legs a couple days week,  No regular walking or aerobic exercise currently- has had difficulty due to heat and air quality    Date of Onset:  03/23/2022  Date is an approximation?: yes  Explanation:  Back and L LE pain began after patient diagnosed with shingles in May  Subjective:     Feels very tired after exercises. Planning to talk to doctor about getting orthotics and referral to podiatry.       Pain  Current pain rating: 5  At best pain rating: 6  At worst pain rating: 8  Location: 6/10 back and 4-5/10 leg  Quality: aching, sore, numbing, tingling and tightness  Relieving factors: ice and heat (stretching, changing positions)  Aggravating factors: walking, when still, lifting and bending (uses grabber)  Pain Related Behaviors: avoidance (gardening, sits down to vacuum, sits down to mop)  Progression: improved  Red flags: none.    Precautions and Equipment  Precautions: Osteoporosis  Current Braces/Orthoses: None  Equipment Currently Used: Rolling walker, Wheelchair - manual and Single point cane (w/c for long distances; used more after her hip replacement, but  not much now)  Prior Functional Status:     Functional Limitation(s)-balance deficits, limited walking tolerance  Current functional status: limited household activities, limited standing tolerance, limited exercise, limited lifting, unsteady gait, limited recreation, limited walking tolerance and use of assistive device  Social Support  Lives in: [place] house (one step to enter without rail, 1/2 step)  Lives with: alone (daughter, heather checks in daily)  Hand dominance: right  Communication Preference: verbal, written and visual  Barriers to Learning: No Barriers  Work/School: retired  Diagnostic Tests  X-ray: abnormal        Treatments  Current treatment: physical therapy      Patient Goals  Patient goals for therapy: decreased pain, improved ambulation, improved balance, increased strength and independence with ADLs/IADLs (able to do more, get stronger, keep the abilities that I have)      OBJECTIVE     Gait:  Ambulates with RW with heavy reliance on UE's, R knee hyperextension, R trendelenburg     Tinel's at tarsal tunnel: + on L     Functional outcomes on 08/26/2022    Oswestry Score: 22 / 50 or 44 %     260 ft in 5 minutes with one 30s standing break, with RW     30s STS: 5 reps with one UE assist on RW    5s STS: 27 sec with one UE assist on RW  5s STS: 36 sec with no UE assist but slightly raised table         TREATMENT RENDERED     Therapeutic Exercise: 38 Minutes   Performed with direct PT demonstration, instruction, supervision, and guidance.   - Reassessment of symptoms and goal reassessment   - Leg press 30 lbs x20   - hamstring curl 40 lbs x10   - 5 minutes of level surface walking   - HEP update and joint goal setting         HEP Access Code: [id number]  Next Visit Plan: consider discussion on orthotics, injection at tarsal tunnel,     Total Treatment Time: 38 Minutes            Therapeutic Interventions Charges  $$ Therapeutic Exercise [mins]: 38                 I attest that I have reviewed the above information.  Signed: Barron Alvine, PT, DPT  08/26/2022 12:10 PM        I reviewed the no-show/attendance policy with the patient and caregiver(s). The patient is aware that they must call to cancel appointments more than 24 hours in advance. They are also aware that if they late cancel or no-show three times, we reserve the right to cancel their remaining appointments. This policy is in place to allow [place] to best serve the needs of our caseload.    If patient returns to clinic with variance in plan of care, then it may be attributable to one or more of the following factors: preferred clinician availability, appointment time request availability, therapy pool appointment availability, major holiday with clinic closure, caregiver availability, patient transportation, conflicting medical appointment, inclement weather, and/or patient illness.    If patient does not return for follow up visit(s) related to this episode of care, this note will serve as their discharge note from Physical Therapy.

## 2022-09-03 NOTE — Unmapped (Signed)
[facility] PT AQUATIC THERAPY CARY  OUTPATIENT PHYSICAL THERAPY  09/03/2022  Note Type: Treatment Note       Patient Name: Christy Ibarra  Date of Birth:Jul 16, 1956  Diagnosis:   Encounter Diagnoses   Name Primary?    Chronic midline low back pain with bilateral sciatica Yes    Sjogren's syndrome with keratoconjunctivitis sicca (CMS-HCC)     Risk for falls      Referring Provider: Johnsie Cancel    Date of Onset of Impairment: 11/23/2018  Date PT Care Plan Established or Reviewed: 08/26/2022  Date PT Treatment Started: 06/09/2022     Plan of Care Effective Date: 09/03/2022 - 09/23/2022 (1 out of 3 units)      ASSESSMENT & PLAN   Assessment  Assessment details:    Tru  presents for Physical Therapy follow-up with LBP/radicular pain. She also presents with impaired balance, gait impairment, and increased risk for falls. Her foot/ankle pain consistent with tarsal tunnel has improved with use of anti-pronation taping. The aquatic environment allowed the patient to produce increased multi joint coordination with decreased compensatory strategies during dynamic gait training. She will benefit from skilled Physical Therapy intervention to address the moderate impairments listed below and to assist the patient in maximizing her functional independence and safe return to prior level of function.             Impairments: decreased endurance, pain, decreased strength, decreased range of motion and impaired motor control      Personal Factors/Comorbidities: 3+    Specific Comorbidities: Bronchiectasis, Asthma-COPD overlap syndrome, scoliosis, osteoporosis, sjogren's, abnormal gait, anxiety, leukopenia, grief    Examination of Body Systems: musculoskeletal, activity/participation and communication    Clinical Presentation: stable    Clinical Decision Making: low    Prognosis: good prognosis    Negative Prognosis Rationale: medical status/condition, chronicity of condition and severity of symptoms.      Therapy Goals      Goals: 1. In 12 weeks the patient will demonstrate independent performance of HEP to maintain functional gains. - PROGRESSING 08/26/22  2. In 12 weeks the patient will report avg pain < or =4/10  for improved quality of life. - PROGRESSING 08/26/22 (stays 5-6 in midline low back especially with activity and squats)  3. In 12 weeks the patient will demonstrate ability to ambulate 200 m continuously for improved community ambulation. (240 ft). - PROGRESSING 08/26/22  4. In 12 weeks the patient will score a 10 point change on the ODI to demonstrate the MDC (0-100%; lower score indicates a lesser level of disability) and to indicate improved activity tolerance.  - PROGRESSING 08/26/22  5. In 12 weeks the patient will complete 5 independent sit to stand transfers in <10 seconds to demonstrate increased functional leg strength.  - PROGRESSING 08/26/22           Plan    Therapy options: will be seen for skilled physical therapy services    Planned therapy interventions: Aquatic Therapy, Balance Training, Education - Patient, Endurance Activites, Functional Mobility, Gait Training, Home Exercise Program, Manual Therapy, Neuromuscular Re-education, Therapeutic Activities, Therapeutic Exercises, Education - Family/Caregiver, Dry Needling, E-Stim, TENS, Self-Care/Home Training, Civil engineer, contracting and Diaphragmatic/Pursed-lip Breathing    DME Equipment: [employer].    Frequency: 1x week    Duration in weeks: 12    Education provided to: patient.    Education provided: HEP, Treatment options and plan, Symptom management, Safety education, Importance of Therapy, Anatomy, Body mechanics, Role of therapy in Rehabilitation,  Posture, Advice worker results: verbalized good understanding, demonstrates understanding and needs reinforcement.    Communication/Consultation: N/A.              SUBJECTIVE   Interpreter Use: Not applicable    History of Present Condition      History of Present Condition/Chief Complaint:       -LBP radiating down L LE x 2.5 months, has persisted since having shingles in May   -Constant Back pain; Leg pain intermittent    Sent to PT for back pain and balance deficits.  Reports constant back pain and intermittent leg pain/tingling since having shingles in May.  They found that there is deterioration in my spine. 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  Now also having shortness of breath, decreased stamina and is scheduled for Pulmonary rehab.  Feeling more off balance again.  Walks with the [facility] now, but had progressed to doing some walking without the walker after last bout of PT.  Now having back pain.    Referred for pulmonary rehab.  Back pain worse in the past few months.      Previous treatment- spinal tap seemed to make it worse  Falls- no falls in the past year- last one Aug 2022; has had home modifications to decrease risk for falls  Home-  Lives alone since her husband passed; Daughter [name] checks in daily  CLOF- ambulates 100 yards at most at a time; took breaks walking from the parking lot; needs to take breaks due to gait issues, back, & fatigue  Any current exercise?- stretching some, moving arms and legs a couple days week,  No regular walking or aerobic exercise currently- has had difficulty due to heat and air quality    Date of Onset:  03/23/2022  Date is an approximation?: yes  Explanation:  Back and L LE pain began after patient diagnosed with shingles in May  Subjective:     Doing well overall. Doing HEP.     Pain  Current pain rating: 5  At best pain rating: 6  At worst pain rating: 8  Location: 6/10 back and 4-5/10 leg  Quality: aching, sore, numbing, tingling and tightness  Relieving factors: ice and heat (stretching, changing positions)  Aggravating factors: walking, when still, lifting and bending (uses grabber)  Pain Related Behaviors: avoidance (gardening, sits down to vacuum, sits down to mop)  Progression: improved  Red flags: none.    Precautions and Equipment  Precautions: Osteoporosis  Current Braces/Orthoses: None  Equipment Currently Used: Rolling walker, Wheelchair - manual and Single point cane (w/c for long distances; used more after her hip replacement, but  not much now)  Prior Functional Status:     Functional Limitation(s)-balance deficits, limited walking tolerance  Current functional status: limited household activities, limited standing tolerance, limited exercise, limited lifting, unsteady gait, limited recreation, limited walking tolerance and use of assistive device  Social Support  Lives in: [place] house (one step to enter without rail, 1/2 step)  Lives with: alone (daughter, heather checks in daily)  Hand dominance: right  Communication Preference: verbal, written and visual  Barriers to Learning: No Barriers  Work/School: retired    Diagnostic Tests  X-ray: abnormal        Treatments  Current treatment: physical therapy      Patient Goals  Patient goals for therapy: decreased pain, improved ambulation, improved balance,  increased strength and independence with ADLs/IADLs (able to do more, get stronger, keep the abilities that I have)      OBJECTIVE     Gait:  Ambulates with RW with heavy reliance on UE's, R knee hyperextension, R trendelenburg     Tinel's at tarsal tunnel: + on L     Functional outcomes on 08/26/2022    Oswestry Score: 22 / 50 or 44 %     260 ft in 5 minutes with one 30s standing break, with RW     30s STS: 5 reps with one UE assist on RW    5s STS: 27 sec with one UE assist on RW  5s STS: 36 sec with no UE assist but slightly raised table         TREATMENT RENDERED       Aquatic Therapy   Performed with direct PT instruction, demonstration, supervision, and guidance  - Reassessment of symptoms     Ambulation: in chest depth water x 6 laps each with HHA   - forward  - sidestepping  - backward       Standing LE: in chest depth water x 20 reps bilaterally  - hip abd  - hip flex    Mass practice progression of step forward onto RLE with weight acceptance and swing through     Seated LE on pool bench x 2x20 reps bilaterally  - bicycle   - STS       HEP Access Code: [id number]  Next Visit Plan: consider discussion on orthotics, injection at tarsal tunnel,     Total Treatment Time: 40 Minutes            Therapeutic Interventions Charges  $$ Aquatic Therapy [mins]: 40                 I attest that I have reviewed the above information.  Signed: Barron Alvine, PT, DPT  09/03/2022 12:40 PM        I reviewed the no-show/attendance policy with the patient and caregiver(s). The patient is aware that they must call to cancel appointments more than 24 hours in advance. They are also aware that if they late cancel or no-show three times, we reserve the right to cancel their remaining appointments. This policy is in place to allow [place] to best serve the needs of our caseload.    If patient returns to clinic with variance in plan of care, then it may be attributable to one or more of the following factors: preferred clinician availability, appointment time request availability, therapy pool appointment availability, major holiday with clinic closure, caregiver availability, patient transportation, conflicting medical appointment, inclement weather, and/or patient illness.    If patient does not return for follow up visit(s) related to this episode of care, this note will serve as their discharge note from Physical Therapy.

## 2022-09-08 ENCOUNTER — Telehealth: Admit: 2022-09-08 | Discharge: 2022-09-09 | Payer: MEDICAID | Attending: Clinical | Primary: Clinical

## 2022-09-08 NOTE — Unmapped (Signed)
INTERNAL MEDICINE COUNSELING SESSION NOTE    LENGTH OF SESSION:  Psychotherapy Session 30 minutes    TYPE OF PSYCHOTHERAPY: Behavioral, Supportive    REASON FOR TREATMENT: Problem Solving Treatment and Mind Body Stress Reduction skill training for caregiver stress, mixed anxiety and depression; link impact of effort to mood.               The patient reports they are currently: at home. I spent 30 minutes on the phone with the patient on the date of service. I spent an additional 10 minutes on pre- and post-visit activities on the date of service    The patient was physically located in [place] or a state in which I am permitted to provide care. The patient and/or parent/guardian understood that s/he may incur co-pays and cost sharing, and agreed to the telemedicine visit. The visit was reasonable and appropriate under the circumstances given the patient's presentation at the time.    The patient and/or parent/guardian has been advised of the potential risks and limitations of this mode of treatment (including, but not limited to, the absence of in-person examination) and has agreed to be treated using telemedicine. The patient's/patient's family's questions regarding telemedicine have been answered.     If the visit was completed in an ambulatory setting, the patient and/or parent/guardian has also been advised to contact their provider???s office for worsening conditions, and seek emergency medical treatment and/or call 911 if the patient deems either necessary.              REVIEW OF FUNCTIONING SINCE LAST VISIT    MOOD (0-10) =    Ok I adopted a Retail banker deferred    SATISFACTION WITH EFFORT IN CARING FOR MOOD (0-10) =    Rating deferred    PAIN:     5    At last visit was:  6    PHQ-9 SCORE:     Rating deferred    GAD-7 SCORE:     Rating deferred      CURRENT SUICIDAL/HOMICIDAL IDEATION:  None      PROBLEM LIST:    Patient Active Problem List   Diagnosis    Abnormal gait    Anxiety    Arthralgia of hip Family history of genetic disorder    Family history of malignant neoplasm of breast    Family history of malignant neoplasm of cervix    Mixed anxiety depressive disorder    Scoliosis    Sjogren's syndrome with keratoconjunctivitis sicca (CMS-HCC)    Leukopenia    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  yes     Any changes in medication since last visit?  no     Any missed doses? no       Current Outpatient Medications on File Prior to Visit   Medication Sig Dispense Refill    abaloparatide 80 mcg (3,120 mcg/1.56 mL) PnIj Inject 80 mcg under the skin daily. 1.56 mL 6    albuterol 2.5 mg /3 mL (0.083 %) nebulizer solution Inhale 3 mL (2.5 mg total) by nebulization every six (6) hours as needed for wheezing or shortness of breath. 360 mL 11  albuterol HFA 90 mcg/actuation inhaler Inhale 1 puff every four (4) hours as needed for wheezing. 8.5 g 11    budesonide-formoteroL (SYMBICORT) 160-4.5 mcg/actuation inhaler Inhale 2 puffs Two (2) times a day. 10.2 g 11    citalopram (CELEXA) 40 MG tablet Take 1 tablet (40 mg total) by mouth daily. 90 tablet 2    diclofenac sodium (VOLTAREN) 1 % gel Apply 2 g topically four (4) times a day. (Patient not taking: Reported on 07/28/2022) 100 g 0    empty container (SHARPS CONTAINER) Misc Use as directed 1 each 2    EPINEPHrine (EPIPEN) 0.3 mg/0.3 mL injection Inject 0.3 mL (0.3 mg total) into the muscle once as needed for anaphylaxis for up to 1 dose. 2 each 1    hydrOXYchloroQUINE (PLAQUENIL) 200 mg tablet Take 1 tablet (200 mg total) by mouth daily. 90 tablet 3    hydrOXYzine (ATARAX) 25 MG tablet Take 1 tablet (25 mg total) by mouth two (2) times a day as needed. 60 tablet 2    ipratropium (ATROVENT) 0.02 % nebulizer solution Inhale the contents of 1 vial (500 mcg total) by nebulization Four (4) times a day. 62.5 mL 12    lidocaine (XYLOCAINE) 5 % ointment Apply topically daily. 141.76 g 0    montelukast (SINGULAIR) 10 mg tablet Take 1 tablet (10 mg total) by mouth daily. 90 tablet 3    omalizumab (XOLAIR) 150 mg/mL syringe Inject the contents of 2 syringes (300 mg total) under the skin every twenty-eight (28) days. 2 mL 11    omeprazole (PRILOSEC) 20 MG capsule Take 1 capsule (20 mg total) by mouth daily. 90 capsule 3    pen needle, diabetic 31 gauge x 3/16 (5 mm) Ndle Use one new needle each morning for injection 100 each 3    pilocarpine (SALAGEN) 5 MG tablet Take 1 tablet (5 mg total) by mouth Three (3) times a day. 270 tablet 3    pregabalin (LYRICA) 25 MG capsule Take 1 capsule (25 mg total) by mouth Two (2) times a day. 180 capsule 1    tiotropium bromide (SPIRIVA RESPIMAT) 2.5 mcg/actuation inhalation mist Inhale 1 puff daily. 4 g 11    traZODone (DESYREL) 50 MG tablet Take 0.5 tablets (25 mg total) by mouth nightly. 90 tablet 2    triamcinolone (KENALOG) 0.1 % cream Apply topically Two (2) times a day. Apply to back for itching 45 g 0     Current Facility-Administered Medications on File Prior to Visit   Medication Dose Route Frequency Provider Last Rate Last Admin    sodium chloride 3 % nebulizer solution 4 mL  4 mL Nebulization Once Ellin Mayhew, MD               SLEEPING:    pretty good Getting 7 to 9 hours of sleep.  Turning off computer and read for 1 hour as a power down    Getting to bed by 11pm          APPETITE:    It's good          SUBSTANCE USE:  No        SESSION FOCUS:  Addressed:  1. Grief    2. Poor sleep        Discussed efforts since last contact.  Had two dogs and adopted another dog 10/11/ 2023 that is 2 yrs and 68 mos old  Turning off computer and read for 1 hour as a power down  Will get into bed with book and her dogs to settle down  Getting to bed by 11pm  Getting out more.  Went to a fall festival at [employer] with friend  Micah Flesher out with friends a couple of times, saw movies            Today's Problem Solving addressed:    Reviewed patient's efforts and linked impact of effort to   1. Grief    2. Poor sleep        Addressed:   Grief, mixed anxiety and depression, poor sleep     Stressors:  Health issues  Limited social support. (Primarily supported by older daughter [name].)  Husband with advanced dementia died in his sleep in the morning of 10/26/21  Patient, and her husband, Doreatha Martin, were together 41 years.  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    Discussed upcoming Thanksgiving   Plan to go to [place]'s for the day, so she doesn't get lost in her thoughts and to make the day easier for her    Talking with daughter [name] about Sam's death helps  Upcoming 1 yr anniversary of his passing 12/4  Discussed possibility of recognizing the day with some ritual/effort  She has plan to let go of his clothes in the spring        Discussed issues presented and options for continued and/or improved self care management             Additional:     Medication adherence and barriers to the treatment plan have been addressed. Opportunities to optimize healthy behaviors have been discussed. Patient / caregiver voiced understanding.                 ASSESSMENT      ENGAGEMENT: Patient presented a willingness to participate in treatment.  PARTICIPATION QUALITY:    Active  MOOD:  Ok I adopted a dog      AFFECT:  FULL RANGE and CONGRUENT   MENTAL STATUS:     alert and oriented  MODES OF INTERVENTION used in this session:    Clarification, Exploration, Problem Solving, Support, or Week review      TREATMENT PLAN    Short term goal focused counseling for identified treatment goals:   [x]  Reduce PHQ / GAD by 5 points  []  Get or keep PHQ / GAD under 10 points  [x]  Stress and self-care management;   []  Referral for outside treatment  []  Bridge to outside treatment  [x]  Other:   Coping skills, resource information, address anticipatory grief     Patient-stated health goal:   Coping skills to get thru the day when I???m totally frustrated by others and what they say and do. Coping skills to overcome the anxiousness.    Next visit will be   6 of up to 12 visits.     Return PHONE visit Wed 11/22 at 1pm - Make sure to use phone visit template - thanks    Clinician shared information about being out of clinic 09/11/22 returning 09/23/22.

## 2022-09-09 ENCOUNTER — Ambulatory Visit: Admit: 2022-09-09 | Payer: MEDICAID

## 2022-09-09 NOTE — Unmapped (Signed)
[facility] [place]  OUTPATIENT PHYSICAL THERAPY  09/09/2022  Note Type: Treatment Note       Patient Name: Christy Ibarra  Date of Birth:[birth year]  Diagnosis:   Encounter Diagnoses   Name Primary?    Chronic midline low back pain with bilateral sciatica Yes    Sjogren's syndrome with keratoconjunctivitis sicca (CMS-HCC)     Risk for falls        Referring Provider: Johnsie Cancel    Date of Onset of Impairment: 11/23/2018  Date PT Care Plan Established or Reviewed: 08/26/2022  Date PT Treatment Started: 06/09/2022     Plan of Care Effective Date: 09/03/2022 - 09/18/22 (2 out of 3 units)     ASSESSMENT & PLAN   Assessment  Assessment details:    [name]  presents for Physical Therapy follow-up with LBP/radicular pain. She also presents with impaired balance, gait impairment, and increased risk for falls. Improving LBP. Improved functional endurance evidenced by increased walking duration with no rest breaks. Her foot/ankle pain consistent with tarsal tunnel has improved with use of anti-pronation taping.  She will benefit from continued skilled Physical Therapy intervention to address the moderate impairments listed below and to assist the patient in maximizing her functional independence and safe return to prior level of function.             Impairments: decreased endurance, pain, decreased strength, decreased range of motion and impaired motor control      Personal Factors/Comorbidities: 3+    Specific Comorbidities: Bronchiectasis, Asthma-COPD overlap syndrome, scoliosis, osteoporosis, sjogren's, abnormal gait, anxiety, leukopenia, grief    Examination of Body Systems: musculoskeletal, activity/participation and communication    Clinical Presentation: stable    Clinical Decision Making: low    Prognosis: good prognosis    Negative Prognosis Rationale: medical status/condition, chronicity of condition and severity of symptoms.      Therapy Goals      Goals:      1. In 12 weeks the patient will demonstrate independent performance of HEP to maintain functional gains. - PROGRESSING 08/26/22  2. In 12 weeks the patient will report avg pain < or =4/10  for improved quality of life. - PROGRESSING 08/26/22 (stays 5-6 in midline low back especially with activity and squats)  3. In 12 weeks the patient will demonstrate ability to ambulate 200 m continuously for improved community ambulation. (240 ft). - PROGRESSING 08/26/22  4. In 12 weeks the patient will score a 10 point change on the ODI to demonstrate the MDC (0-100%; lower score indicates a lesser level of disability) and to indicate improved activity tolerance.  - PROGRESSING 08/26/22  5. In 12 weeks the patient will complete 5 independent sit to stand transfers in <10 seconds to demonstrate increased functional leg strength.  - PROGRESSING 08/26/22           Plan    Therapy options: will be seen for skilled physical therapy services    Planned therapy interventions: Aquatic Therapy, Balance Training, Education - Patient, Endurance Activites, Functional Mobility, Gait Training, Home Exercise Program, Manual Therapy, Neuromuscular Re-education, Therapeutic Activities, Therapeutic Exercises, Education - Family/Caregiver, Dry Needling, E-Stim, TENS, Self-Care/Home Training, Civil engineer, contracting and Diaphragmatic/Pursed-lip Breathing    DME Equipment: [employer].    Frequency: 1x week    Duration in weeks: 12    Education provided to: patient.    Education provided: HEP, Treatment options and plan, Symptom management, Safety education, Importance of Therapy, Anatomy, Body mechanics, Role of therapy  in Rehabilitation, Posture, Community resources and [employer]    Education results: verbalized good understanding, demonstrates understanding and needs reinforcement.    Communication/Consultation: N/A.              SUBJECTIVE   Interpreter Use: Not applicable    History of Present Condition      History of Present Condition/Chief Complaint:       -LBP radiating down L LE x 2.5 months, has persisted since having shingles in May   -Constant Back pain; Leg pain intermittent    Sent to PT for back pain and balance deficits.  Reports constant back pain and intermittent leg pain/tingling since having shingles in May.  They found that there is deterioration in my spine. 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  Now also having shortness of breath, decreased stamina and is scheduled for Pulmonary rehab.  Feeling more off balance again.  Walks with the [facility] now, but had progressed to doing some walking without the walker after last bout of PT.  Now having back pain.    Referred for pulmonary rehab.  Back pain worse in the past few months.      Previous treatment- spinal tap seemed to make it worse  Falls- no falls in the past year- last one Aug 2022; has had home modifications to decrease risk for falls  Home-  Lives alone since her husband passed; Daughter [name] checks in daily  CLOF- ambulates 100 yards at most at a time; took breaks walking from the parking lot; needs to take breaks due to gait issues, back, & fatigue  Any current exercise?- stretching some, moving arms and legs a couple days week,  No regular walking or aerobic exercise currently- has had difficulty due to heat and air quality    Date of Onset:  03/23/2022  Date is an approximation?: yes  Explanation:  Back and L LE pain began after patient diagnosed with shingles in May  Subjective:     Felt great after the pool. It helped my back and feet.       Pain  Current pain rating: 5  At best pain rating: 6  At worst pain rating: 8  Location: 6/10 back and 4-5/10 leg  Quality: aching, sore, numbing, tingling and tightness  Relieving factors: ice and heat (stretching, changing positions)  Aggravating factors: walking, when still, lifting and bending (uses grabber)  Pain Related Behaviors: avoidance (gardening, sits down to vacuum, sits down to mop)  Progression: improved  Red flags: none.    Precautions and Equipment  Precautions: Osteoporosis  Current Braces/Orthoses: None  Equipment Currently Used: Rolling walker, Wheelchair - manual and Single point cane (w/c for long distances; used more after her hip replacement, but  not much now)  Prior Functional Status:     Functional Limitation(s)-balance deficits, limited walking tolerance  Current functional status: limited household activities, limited standing tolerance, limited exercise, limited lifting, unsteady gait, limited recreation, limited walking tolerance and use of assistive device  Social Support  Lives in: [place] house (one step to enter without rail, 1/2 step)  Lives with: alone (daughter, heather checks in daily)  Hand dominance: right  Communication Preference: verbal, written and visual  Barriers to Learning: No Barriers  Work/School: retired    Diagnostic Tests  X-ray: abnormal          Patient Goals  Patient goals for therapy: decreased pain, improved ambulation, improved  balance, increased strength and independence with ADLs/IADLs (able to do more, get stronger, keep the abilities that I have)      OBJECTIVE     Gait:  Ambulates with RW with heavy reliance on UE's, R knee hyperextension, R trendelenburg     Tinel's at tarsal tunnel: + on L     420 ft in 5 minutes with RW, no rest breaks on 09/09/22    Functional outcomes on 08/26/2022    Oswestry Score: 22 / 50 or 44 %     30s STS: 5 reps with one UE assist on RW    5s STS: 27 sec with one UE assist on RW  5s STS: 36 sec with no UE assist but slightly raised table         TREATMENT RENDERED     Therapeutic Exercise: 40 Minutes   Performed with direct PT demonstration, instruction, supervision, and guidance.   - Reassessment of symptoms   - NuStep L2 x6 min   - Leg press 40 lbs 2x12  - hamstring curl 40 lbs x10   - 5 minutes of level surface walking with no breaks  - HEP update         HEP Access Code: [email or web address]  Next Visit Plan: discharge from pool Total Treatment Time: 40 Minutes            Therapeutic Interventions Charges  $$ Therapeutic Exercise [mins]: 40                 I attest that I have reviewed the above information.  Signed: Barron Alvine, PT, DPT  09/09/2022 11:15 AM        I reviewed the no-show/attendance policy with the patient and caregiver(s). The patient is aware that they must call to cancel appointments more than 24 hours in advance. They are also aware that if they late cancel or no-show three times, we reserve the right to cancel their remaining appointments. This policy is in place to allow [place] to best serve the needs of our caseload.    If patient returns to clinic with variance in plan of care, then it may be attributable to one or more of the following factors: preferred clinician availability, appointment time request availability, therapy pool appointment availability, major holiday with clinic closure, caregiver availability, patient transportation, conflicting medical appointment, inclement weather, and/or patient illness.    If patient does not return for follow up visit(s) related to this episode of care, this note will serve as their discharge note from Physical Therapy.

## 2022-09-17 NOTE — Unmapped (Signed)
[facility] PT [facility] [place]  OUTPATIENT PHYSICAL THERAPY  09/17/2022  Note Type: Discharge Note  Note Date Range: 08/26/22 - 09/17/22    Patient Name: Christy Ibarra  Date of Birth:[birth year]  Diagnosis:   Encounter Diagnoses   Name Primary?    Chronic midline low back pain with bilateral sciatica Yes    Sjogren's syndrome with keratoconjunctivitis sicca (CMS-HCC)     Risk for falls        Referring Provider: Johnsie Cancel    Date of Onset of Impairment: 11/23/2018  Date PT Care Plan Established or Reviewed: 08/26/2022  Date PT Treatment Started: 06/09/2022     Plan of Care Effective Date: 09/03/2022 - 09/18/22 (3 out of 3 units)     ASSESSMENT & PLAN   Assessment  Assessment details:    Christy Ibarra presents for Physical Therapy discharge with improved LBP/radicular pain. She has completed 11 PT sessions over the last 3 months. She has progressed to meet most of her PT goals. Improved functional endurance evidenced by increased walking duration with no rest breaks. Improved on all functional outcome measures. Patient demonstrated independent performance of HEP and knowledge of safe exercise progression. Christy Ibarra is discharged from skilled Physical Therapy today with an independent home exercise program to maintain functional gains and maximize her safety.               Impairments: decreased endurance, pain, decreased strength, decreased range of motion and impaired motor control      Personal Factors/Comorbidities: 3+    Specific Comorbidities: Bronchiectasis, Asthma-COPD overlap syndrome, scoliosis, osteoporosis, sjogren's, abnormal gait, anxiety, leukopenia, grief    Examination of Body Systems: musculoskeletal, activity/participation and communication    Clinical Presentation: stable    Clinical Decision Making: low    Prognosis: good prognosis    Negative Prognosis Rationale: medical status/condition, chronicity of condition and severity of symptoms.      Therapy Goals      Goals:      1. In 12 weeks the patient will demonstrate independent performance of HEP to maintain functional gains. - MET 09/17/22  2. In 12 weeks the patient will report avg pain < or =4/10  for improved quality of life.  - MET 09/17/22  3. In 12 weeks the patient will demonstrate ability to ambulate 200 m continuously for improved community ambulation. (240 ft).  - MET 09/17/22  4. In 12 weeks the patient will score a 10 point change on the ODI to demonstrate the MDC (0-100%; lower score indicates a lesser level of disability) and to indicate improved activity tolerance.   - NOT MET 09/17/22  5. In 12 weeks the patient will complete 5 independent sit to stand transfers in <10 seconds to demonstrate increased functional leg strength.   - NOT MET 09/17/22           Plan    Therapy options: will be seen for skilled physical therapy services    Planned therapy interventions: Aquatic Therapy, Balance Training, Education - Patient, Endurance Activites, Functional Mobility, Gait Training, Home Exercise Program, Manual Therapy, Neuromuscular Re-education, Therapeutic Activities, Therapeutic Exercises, Education - Family/Caregiver, Dry Needling, E-Stim, TENS, Self-Care/Home Training, Civil engineer, contracting and Diaphragmatic/Pursed-lip Breathing    DME Equipment: [employer].      Duration in weeks: discharge    Education provided to: patient.    Education provided: HEP, Treatment options and plan, Symptom management, Safety education, Importance of Therapy, Anatomy, Body mechanics, Role of therapy in Rehabilitation, Posture, Community resources and  Body awareness    Education results: verbalized good understanding, demonstrates understanding and needs reinforcement.    Communication/Consultation: N/A.              SUBJECTIVE   Interpreter Use: Not applicable    History of Present Condition      History of Present Condition/Chief Complaint:       -LBP radiating down L LE x 2.5 months, has persisted since having shingles in May   -Constant Back pain; Leg pain intermittent    Sent to PT for back pain and balance deficits.  Reports constant back pain and intermittent leg pain/tingling since having shingles in May.  They found that there is deterioration in my spine. 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  Now also having shortness of breath, decreased stamina and is scheduled for Pulmonary rehab.  Feeling more off balance again.  Walks with the [facility] now, but had progressed to doing some walking without the walker after last bout of PT.  Now having back pain.    Referred for pulmonary rehab.  Back pain worse in the past few months.      Previous treatment- spinal tap seemed to make it worse  Falls- no falls in the past year- last one Aug 2022; has had home modifications to decrease risk for falls  Home-  Lives alone since her husband passed; Daughter [name] checks in daily  CLOF- ambulates 100 yards at most at a time; took breaks walking from the parking lot; needs to take breaks due to gait issues, back, & fatigue  Any current exercise?- stretching some, moving arms and legs a couple days week,  No regular walking or aerobic exercise currently- has had difficulty due to heat and air quality    Date of Onset:  03/23/2022  Date is an approximation?: yes  Explanation:  Back and L LE pain began after patient diagnosed with shingles in May  Subjective:     I am ready to keep exercising at home. I think I will join the pool.       Pain  Current pain rating: 4  At best pain rating: 4  At worst pain rating: 6  Location: low back and right leg  Quality: aching, sore, numbing, tingling and tightness  Relieving factors: ice and heat (stretching, changing positions)  Aggravating factors: walking, when still, lifting and bending (uses grabber)  Pain Related Behaviors: avoidance (gardening, sits down to vacuum, sits down to mop)  Progression: improved  Red flags: none.    Precautions and Equipment  Precautions: Osteoporosis  Current Braces/Orthoses: None  Equipment Currently Used: Rolling walker, Wheelchair - manual and Single point cane (w/c for long distances; used more after her hip replacement, but  not much now)  Prior Functional Status:     Functional Limitation(s)-balance deficits, limited walking tolerance  Current functional status: limited household activities, limited standing tolerance, limited exercise, limited lifting, unsteady gait, limited recreation, limited walking tolerance and use of assistive device  Social Support  Lives in: [place] house (one step to enter without rail, 1/2 step)  Lives with: alone (daughter, heather checks in daily)  Hand dominance: right  Communication Preference: verbal, written and visual  Barriers to Learning: No Barriers  Work/School: retired    Diagnostic Tests  X-ray: abnormal          Patient Goals  Patient goals for therapy: decreased pain, improved ambulation, improved balance, increased  strength and independence with ADLs/IADLs (able to do more, get stronger, keep the abilities that I have)      OBJECTIVE     Gait:  Ambulates with RW with heavy reliance on UE's, R knee hyperextension, R trendelenburg     Tinel's at tarsal tunnel: + on L     420 ft in 5 minutes with RW, no rest breaks on 09/09/22    Functional outcomes on 08/26/2022    Oswestry Score: 22 / 50 or 44 %     30s STS: 5 reps with one UE assist on RW    5s STS: 27 sec with one UE assist on RW  5s STS: 36 sec with no UE assist but slightly raised table         TREATMENT RENDERED     Aquatic Therapy   Performed with direct PT instruction, demonstration, supervision, and guidance  - Reassessment of symptoms      Ambulation: in chest depth water x 6 laps each with HHA  --> CGA   - forward  - sidestepping    Deep well with noodle and UE support x20 ea   - hip abd  - hip flex  - hip E      Seated LE on pool bench x 2x20 reps bilaterally  - bicycle   - STS  - hamstring curls with 8.8 lbs 2x10     Chest depth with 8.8 lbs ball   - single arm push down x10   - Double arm pass x10 with slight squat       HEP Access Code: [id number]  Next Visit Plan: discharge from pool     Total Treatment Time: 40 Minutes            Therapeutic Interventions Charges  $$ Aquatic Therapy [mins]: 40                 I attest that I have reviewed the above information.  Signed: Barron Alvine, PT, DPT  09/17/2022 12:46 PM        I reviewed the no-show/attendance policy with the patient and caregiver(s). The patient is aware that they must call to cancel appointments more than 24 hours in advance. They are also aware that if they late cancel or no-show three times, we reserve the right to cancel their remaining appointments. This policy is in place to allow [place] to best serve the needs of our caseload.    If patient returns to clinic with variance in plan of care, then it may be attributable to one or more of the following factors: preferred clinician availability, appointment time request availability, therapy pool appointment availability, major holiday with clinic closure, caregiver availability, patient transportation, conflicting medical appointment, inclement weather, and/or patient illness.    If patient does not return for follow up visit(s) related to this episode of care, this note will serve as their discharge note from Physical Therapy.

## 2022-09-22 MED FILL — OMEPRAZOLE 20 MG CAPSULE,DELAYED RELEASE: ORAL | 90 days supply | Qty: 90 | Fill #2

## 2022-09-22 MED FILL — EMPTY CONTAINER: 120 days supply | Qty: 1 | Fill #1

## 2022-09-22 MED FILL — XOLAIR 150 MG/ML SUBCUTANEOUS SYRINGE: SUBCUTANEOUS | 28 days supply | Qty: 2 | Fill #5

## 2022-09-22 MED FILL — SYMBICORT 160 MCG-4.5 MCG/ACTUATION HFA AEROSOL INHALER: RESPIRATORY_TRACT | 30 days supply | Qty: 10.2 | Fill #4

## 2022-09-22 MED FILL — PILOCARPINE 5 MG TABLET: ORAL | 90 days supply | Qty: 270 | Fill #2

## 2022-10-06 DIAGNOSIS — M3501 Sicca syndrome with keratoconjunctivitis: Principal | ICD-10-CM

## 2022-10-06 DIAGNOSIS — M2550 Pain in unspecified joint: Principal | ICD-10-CM

## 2022-10-06 DIAGNOSIS — J4489 Asthma-COPD overlap syndrome: Principal | ICD-10-CM

## 2022-10-06 MED ORDER — HYDROXYCHLOROQUINE 200 MG TABLET
ORAL_TABLET | Freq: Every day | ORAL | 0 refills | 90 days | Status: CP
Start: 2022-10-06 — End: ?

## 2022-10-06 MED ORDER — MONTELUKAST 10 MG TABLET
ORAL_TABLET | Freq: Every day | ORAL | 3 refills | 90 days | Status: CP
Start: 2022-10-06 — End: 2023-10-06
  Filled 2022-10-23: qty 90, 90d supply, fill #0

## 2022-10-06 NOTE — Unmapped (Signed)
HCQ refill  Last Visit Date: 09/26/2021  Next Visit Date: 10/22/2022

## 2022-10-06 NOTE — Unmapped (Signed)
[facility] Clinical Assessment & Refill Coordination Note    Christy Ibarra, DOB: [birth year]  Phone: [phone] (home)     All above HIPAA information was verified with patient.     Was a Nurse, learning disability used for this call? No    Specialty Medication(s):   CF/Pulmonary/Asthma: Xolair 300mg      Current Outpatient Medications   Medication Sig Dispense Refill    abaloparatide 80 mcg (3,120 mcg/1.56 mL) PnIj Inject 80 mcg under the skin daily. 1.56 mL 6    albuterol 2.5 mg /3 mL (0.083 %) nebulizer solution Inhale 3 mL (2.5 mg total) by nebulization every six (6) hours as needed for wheezing or shortness of breath. 360 mL 11    albuterol HFA 90 mcg/actuation inhaler Inhale 1 puff every four (4) hours as needed for wheezing. 8.5 g 11    budesonide-formoteroL (SYMBICORT) 160-4.5 mcg/actuation inhaler Inhale 2 puffs Two (2) times a day. 10.2 g 11    citalopram (CELEXA) 40 MG tablet Take 1 tablet (40 mg total) by mouth daily. 90 tablet 2    diclofenac sodium (VOLTAREN) 1 % gel Apply 2 g topically four (4) times a day. (Patient not taking: Reported on 07/28/2022) 100 g 0    empty container (SHARPS CONTAINER) Misc Use as directed 1 each 2    EPINEPHrine (EPIPEN) 0.3 mg/0.3 mL injection Inject 0.3 mL (0.3 mg total) into the muscle once as needed for anaphylaxis for up to 1 dose. 2 each 1    hydrOXYzine (ATARAX) 25 MG tablet Take 1 tablet (25 mg total) by mouth two (2) times a day as needed. 60 tablet 2    ipratropium (ATROVENT) 0.02 % nebulizer solution Inhale the contents of 1 vial (500 mcg total) by nebulization Four (4) times a day. 62.5 mL 12    lidocaine (XYLOCAINE) 5 % ointment Apply topically daily. 141.76 g 0    montelukast (SINGULAIR) 10 mg tablet Take 1 tablet (10 mg total) by mouth daily. 90 tablet 3    omalizumab (XOLAIR) 150 mg/mL syringe Inject the contents of 2 syringes (300 mg total) under the skin every twenty-eight (28) days. 2 mL 11    omeprazole (PRILOSEC) 20 MG capsule Take 1 capsule (20 mg total) by mouth daily. 90 capsule 3    pen needle, diabetic 31 gauge x 3/16 (5 mm) Ndle Use one new needle each morning for injection 100 each 3    pilocarpine (SALAGEN) 5 MG tablet Take 1 tablet (5 mg total) by mouth Three (3) times a day. 270 tablet 3    pregabalin (LYRICA) 25 MG capsule Take 1 capsule (25 mg total) by mouth Two (2) times a day. 180 capsule 1    tiotropium bromide (SPIRIVA RESPIMAT) 2.5 mcg/actuation inhalation mist Inhale 1 puff daily. 4 g 11    traZODone (DESYREL) 50 MG tablet Take 0.5 tablets (25 mg total) by mouth nightly. 90 tablet 2    triamcinolone (KENALOG) 0.1 % cream Apply topically Two (2) times a day. Apply to back for itching 45 g 0     Current Facility-Administered Medications   Medication Dose Route Frequency Provider Last Rate Last Admin    sodium chloride 3 % nebulizer solution 4 mL  4 mL Nebulization Once Ellin Mayhew, MD            Changes to medications: Deem reports no changes at this time.    Allergies   Allergen Reactions    [facility] Dust  Cough       Changes to allergies: No    SPECIALTY MEDICATION ADHERENCE     Xolair 150 mg/ml: 0 days of medicine on hand     Medication Adherence    Patient reported X missed doses in the last month: 0  Specialty Medication: XOLAIR 150 mg/mL syringe (omalizumab)  Patient is on additional specialty medications: No  Patient is on more than two specialty medications: No  Any gaps in refill history greater than 2 weeks in the last 3 months: no  Demonstrates understanding of importance of adherence: yes  Informant: patient                            Specialty medication(s) dose(s) confirmed: Regimen is correct and unchanged.     Are there any concerns with adherence? No    Adherence counseling provided? Not needed    CLINICAL MANAGEMENT AND INTERVENTION      Clinical Benefit Assessment:    Do you feel the medicine is effective or helping your condition? Yes - takes a few days to notice symptom improvement after receiving injection.     Clinical Benefit counseling provided? consulted provider regarding clinical benefit concerns - May benefit for increased dose of Xolair based on weight.     Adverse Effects Assessment:    Are you experiencing any side effects? No    Are you experiencing difficulty administering your medicine? 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 For example, brushing your teeth or getting up in the morning. Ms. Blackwater reports using oxygen supplementation more frequently and states she limits going outside due to risk of increased SOB.    Have you discussed this with your provider? Yes    Acute Infection Status:    Acute infections noted within Epic:  No active infections  Patient reported infection: None    Therapy Appropriateness:    Is therapy appropriate and patient progressing towards therapeutic goals? Yes, therapy is appropriate and should be continued    DISEASE/MEDICATION-SPECIFIC INFORMATION      For patients on injectable medications: Patient currently has 0 doses left.  Next injection is scheduled for 09/29/22.    Asthma and Allergy: Have you had an asthma exacerbation in the last 30 days? No  Have you needed to use your rescue inhaler more often than usual in the last 30 days? Yes, increased albuterol use to twice daily with allergy season/weather changes  Have you needed to take steroids for your asthma in the last 30 days? No    PATIENT SPECIFIC NEEDS     Does the patient have any physical, cognitive, or cultural barriers? No    Is the patient high risk? No    Did the patient require a clinical intervention? No    Does the patient require physician intervention or other additional services (i.e., nutrition, smoking cessation, social work)? No    SOCIAL DETERMINANTS OF HEALTH     At the [facility], we have learned that life circumstances - like trouble affording food, housing, utilities, or transportation can affect the health of many of our patients.   That is why we wanted to ask: are you currently experiencing any life circumstances  that are negatively impacting your health and/or quality of life? Patient declined to answer    Social Determinants of Health     Financial Resource Strain: High Risk (06/23/2021)    Overall Financial Resource Strain (CARDIA)     Difficulty of Paying Living Expenses: Hard   Internet Connectivity: Not on file   Food Insecurity: Food Insecurity Present (06/23/2021)    Hunger Vital Sign     Worried About Running Out of Food in the Last Year: Sometimes true     Ran Out of Food in the Last Year: Sometimes true   Tobacco Use: Medium Risk (09/17/2022)    Patient History     Smoking Tobacco Use: Former     Smokeless Tobacco Use: Never     Passive Exposure: Never   Housing/Utilities: Low Risk  (06/23/2021)    Housing/Utilities     Within the past 12 months, have you ever stayed: outside, in a car, in a tent, in an overnight shelter, or temporarily in someone else's home (i.e. couch-surfing)?: No     Are you worried about losing your housing?: No     Within the past 12 months, have you been unable to get utilities (heat, electricity) when it was really needed?: No   Alcohol Use: Not At Risk (01/15/2022)    Alcohol Use     How often do you have a drink containing alcohol?: Never     How many drinks containing alcohol do you have on a typical day when you are drinking?: 1 - 2     How often do you have 5 or more drinks on one occasion?: Never   Transportation Needs: No Transportation Needs (06/23/2021)    PRAPARE - Transportation     Lack of Transportation (Medical): No     Lack of Transportation (Non-Medical): No   Substance Use: Low Risk  (01/15/2022)    Substance Use     Taken prescription drugs for non-medical reasons: Never     Taken illegal drugs: Never     Patient indicated they have taken drugs in the past year for non-medical reasons: Yes, [positive answer(s)]: Not on file   Health Literacy: Medium Risk (06/23/2021)    Health Literacy     : Sometimes   Physical Activity: Not on file   Interpersonal Safety: Not on file   Stress: Not on file   Intimate Partner Violence: Not At Risk (06/23/2021)    Humiliation, Afraid, Rape, and Kick questionnaire     Fear of Current or Ex-Partner: No     Emotionally Abused: No     Physically Abused: No     Sexually Abused: No   Depression: At risk (06/23/2021)    PHQ-2     PHQ-2 Score: 3   Social Connections: Not on file       Would you be willing to receive help with any of the needs that you have identified today? Not applicable       SHIPPING     Specialty Medication(s) to be Shipped:   CF/Pulmonary/Asthma: Xolair 150mg /mL    Other medication(s) to be shipped:  citalopram 40mg , hydroxychloroquine 200mg , montelukast 10mg , and ipratropium 0.02% inhaler     Changes to insurance: No    Delivery Scheduled: Yes, Expected medication delivery date: 10/20/22.     Medication will be delivered via UPS to the confirmed prescription address in [facility].    The patient will receive a drug information handout for each medication shipped and additional FDA  Medication Guides as required.  Verified that patient has previously received a Conservation officer, historic buildings and a Surveyor, mining.    The patient or caregiver noted above participated in the development of this care plan and knows that they can request review of or adjustments to the care plan at any time.      All of the patient's questions and concerns have been addressed.    Oliva Bustard, PharmD   [facility] Specialty Pharmacist

## 2022-10-07 ENCOUNTER — Ambulatory Visit: Admit: 2022-10-07 | Discharge: 2022-10-08 | Payer: MEDICAID

## 2022-10-11 DIAGNOSIS — J4489 Asthma-COPD overlap syndrome: Principal | ICD-10-CM

## 2022-10-11 MED ORDER — XOLAIR 150 MG/ML SUBCUTANEOUS SYRINGE
SUBCUTANEOUS | 11 refills | 28 days | Status: CP
Start: 2022-10-11 — End: 2023-10-11

## 2022-10-13 NOTE — Unmapped (Unsigned)
Last clinic visit: 09/26/21    Accompanied by: her daughter, [name], who contributed key portions of the history. ***    Chief complaint: follow-up Sjogren's     History of Present Illness:     HPI:  Christy Ibarra is a 66 y.o. female with a complicated past history including 10 year history of sicca symptoms, arthralgia, and gait abnormalities as well as +SSA/SSB, + ANA, + RF and lip biopsy with focal lymphocytic sialadenitis compatible with Dx Sjogren's. Also with mild leukopenia hematology feels may be due to Sjogren's. Neuro has raised the question of whether worsening of her gait was due to transverse myelitis and questioned whether that could be due to Sjogren's. Arthralgia improved on plaquenil started Feb 2021. Her mobility and strength have also gradually improved.  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     Events since last visit:  - Patient had a UGI endoscopy on 12/11/21  - Followed with pulmonology, neurology, internal medicine, PT, and GI.    Current treatment: ***  - Plaquenil 200 mg daily    Interval History:  - ***    Record review:  I have reviewed the patient's allergies, medications, pertinent past medical, surgical, social and family history and have updated in Epic where appropriate. I have also reviewed the pertinent past medical records including notes, labs, imaging tests in the medical record and in Care Everywhere.     Objective    Physical Exam:  There were no vitals filed for this visit.    There is no height or weight on file to calculate BMI.  GENERAL: The patient is well appearing, in no acute distress.   SKIN: No rash.   EYES: EOMI, PERRL. Sclera anicteric, conjunctiva non- injected.   ENT: mucus membranes moist.   Neck: supple, no cervical lymphadenopathy  Respiratory: Breathing non-labored, CTA bilaterally  CV: Heart rate regular, no murmurs  GI: Abdomen soft, nontender, nondistended, no hepatosplenomegaly  VASCULAR: warm and well perfused extremities, no c/c/e.  NEURO: CN 2-12 grossly intact.   PSYCH: No depression or anxiety. Cooperative. Alert and oriented.   MUSCULOSKELETAL:   Bilateral shoulders, elbows, wrists, hands, fingers:  No deformity, erythema, warmth, swelling, effusion, tenderness, or limited ROM.?? Able to curl all fingers. Prayer sign negative.   Bilateral knees, ankles, feet, toes: No deformity, erythema, warmth, swelling, effusion, tenderness, or limited ROM. MTP squeeze negative.     Assessment/Plan:     Christy Ibarra is a 66 y.o. female with a complicated past history including 10 year history of sicca symptoms, arthralgia, and gait abnormalities as well as +SSA/SSB, + ANA, + RF and lip biopsy with focal lymphocytic sialadenitis compatible with Dx Sjogren's. Neuro has raised the question of whether worsening of her gait was due to transverse myelitis and questioned whether that could be due to Sjogren's. Pt previously reported improvement in arthralgia on plaquenil started Feb 2021. Her mobility and strength have also gradually improved.  Also following with pulm for dyspnea and abnormal PFTs, COPD and possible bronchiolitis.      Sjogren's:  +SSA/SSB, + ANA, + RF and lip biopsy with focal lymphocytic sialadenitis.  Also with some arthralgia that initially improved somewhat with Plaquenil. No new or worsening weakness though difficult to evaluate currently given recent hip fracture. Breathing stable. Chest CT April 2022 with improvement. CBC and CMP from 08/04/2021 stable.   - Will defer updating labs today  -  Continue Plaquenil 200 mg daily.  - Continue Salagen 5 mg BID to TID.      Osteoporosis: Dexa Oct 2022 with lumbar spine T score -4.1, L hip total -4.1, femoral neck -3.5.  - Agree with plan to see endocrine to discuss treatment.     Immunization Counseling:  Covid-19 Vaccine (Moderna): 09/04/20, 12/06/20, 03/24/21, bivalent booster in clinic today (09/26/2021).  Influenza Vaccine: 09/03/21  Pneumococcal: 06/18/20    Long-term Plaquenil use: Patient is currently taking plaquenil which requires intensive monitoring including regular eye exams due to risk for retinal toxicity.    - Plaquenil started: Feb 2021  - Last eye exam was: Dec 2021 [facility] ophtho without signs of plaquenil toxicity.   - Patient has the following risk factors for retinal toxicity from plaquenil: Age >60 yrs    We discussed the above including diagnosis and recommendations, agreed on the above plan, and all questions were answered.    Follow-up: No follow-ups on file.    Danella Maiers, MD, MSCI  Assistant Professor of Medicine  Department of Medicine/Division of Rheumatology  [facility]  [phone] clinic phone  [phone] clinic secure fax      We appreciate the opportunity to participate in the care of this patient. I personally spent *** minutes face-to-face and non-face-to-face in the care of this patient, which includes all pre, intra, and post visit time on the date of service.       There are no diagnoses linked to this encounter.       ***ATTEST

## 2022-10-14 ENCOUNTER — Institutional Professional Consult (permissible substitution): Admit: 2022-10-14 | Discharge: 2022-10-15 | Payer: MEDICAID | Attending: Clinical | Primary: Clinical

## 2022-10-14 DIAGNOSIS — F4321 Adjustment disorder with depressed mood: Principal | ICD-10-CM

## 2022-10-14 DIAGNOSIS — Z72820 Sleep deprivation: Principal | ICD-10-CM

## 2022-10-14 DIAGNOSIS — F418 Other specified anxiety disorders: Principal | ICD-10-CM

## 2022-10-14 NOTE — Unmapped (Signed)
INTERNAL MEDICINE COUNSELING SESSION NOTE    LENGTH OF SESSION:  Psychotherapy Session 30 minutes    TYPE OF PSYCHOTHERAPY: Behavioral, Supportive    REASON FOR TREATMENT: Problem Solving Treatment and Mind Body Stress Reduction skill training for {Blank multiple:29302::depression,anxiety,grief,stress,impact of trauma,***}; link impact of effort to mood.     75688}    {    Coding tips - Do not edit this text, it will delete upon signing of note!    Telephone visits [phone] for Physicians and APPs and [phone] for Non- Physician Clinicians)- Only use minutes on the phone to determine level of service.    Video visits [phone]) - Use either level of medical decision making just as an in-person visit OR time which includes both minutes on video and pre/post minutes to determine the level of service.      :75688}  The patient reports they are physically located in [place] and is currently: at home. I conducted a phone visit.  I spent 30 minutes on the phone call with the patient on the date of service .           REVIEW OF FUNCTIONING SINCE LAST VISIT    MOOD (0-10) =    ***  Rating deferred    SATISFACTION WITH EFFORT IN CARING FOR MOOD (0-10) =    Rating deferred    PAIN:     ***    At last visit was:  ***    PHQ-9 SCORE:     Rating deferred    GAD-7 SCORE:     Rating deferred      CURRENT SUICIDAL/HOMICIDAL IDEATION:  {Blank multiple:29302::None,Endorses passive thoughts of death. No plan or intent to hurt or kill themself. No HI,No HI,Presentation insufficient for IVC. Raised option for hospitalization. Mitigating factors: ***}      PROBLEM LIST:    Patient Active Problem List   Diagnosis    Abnormal gait    Anxiety    Arthralgia of hip    Family history of genetic disorder    Family history of malignant neoplasm of breast    Family history of malignant neoplasm of cervix    Mixed anxiety depressive disorder    Scoliosis    Sjogren's syndrome with keratoconjunctivitis sicca albuterol 2.5 mg /3 mL (0.083 %) nebulizer solution Inhale 3 mL (2.5 mg total) by nebulization every six (6) hours as needed for wheezing or shortness of breath. 360 mL 11    albuterol HFA 90 mcg/actuation inhaler Inhale 1 puff every four (4) hours as needed for wheezing. 8.5 g 11    budesonide-formoteroL (SYMBICORT) 160-4.5 mcg/actuation inhaler Inhale 2 puffs Two (2) times a day. 10.2 g 11    citalopram (CELEXA) 40 MG tablet Take 1 tablet (40 mg total) by mouth daily. 90 tablet 2    diclofenac sodium (VOLTAREN) 1 % gel Apply 2 g topically four (4) times a day. (Patient not taking: Reported on 07/28/2022) 100 g 0    empty container (SHARPS CONTAINER) Misc Use as directed 1 each 2    EPINEPHrine (EPIPEN) 0.3 mg/0.3 mL injection Inject 0.3 mL (0.3 mg total) into the muscle once as needed for anaphylaxis for up to 1 dose. 2 each 1    hydroxychloroquine (PLAQUENIL) 200 mg tablet Take 1 tablet (200 mg total) by mouth daily. 90 tablet 0    hydrOXYzine (ATARAX) 25 MG tablet Take 1 tablet (25 mg total) by mouth two (2) times a day as needed. 60 tablet 2  ipratropium (ATROVENT) 0.02 % nebulizer solution Inhale the contents of 1 vial (500 mcg total) by nebulization Four (4) times a day. 62.5 mL 12    lidocaine (XYLOCAINE) 5 % ointment Apply topically daily. 141.76 g 0    montelukast (SINGULAIR) 10 mg tablet Take 1 tablet (10 mg total) by mouth daily. 90 tablet 3    omalizumab (XOLAIR) 150 mg/mL syringe Inject the contents of 2 syringes (300 mg total) under the skin every fourteen (14) days. 4 mL 11    omeprazole (PRILOSEC) 20 MG capsule Take 1 capsule (20 mg total) by mouth daily. 90 capsule 3    pen needle, diabetic 31 gauge x 3/16 (5 mm) Ndle Use one new needle each morning for injection 100 each 3    pilocarpine (SALAGEN) 5 MG tablet Take 1 tablet (5 mg total) by mouth Three (3) times a day. 270 tablet 3    pregabalin (LYRICA) 25 MG capsule Take 1 capsule (25 mg total) by mouth Two (2) times a day. 180 capsule 1 tiotropium bromide (SPIRIVA RESPIMAT) 2.5 mcg/actuation inhalation mist Inhale 1 puff daily. 4 g 11    traZODone (DESYREL) 50 MG tablet Take 0.5 tablets (25 mg total) by mouth nightly. 90 tablet 2    triamcinolone (KENALOG) 0.1 % cream Apply topically Two (2) times a day. Apply to back for itching 45 g 0     Current Facility-Administered Medications on File Prior to Visit   Medication Dose Route Frequency Provider Last Rate Last Admin    sodium chloride 3 % nebulizer solution 4 mL  4 mL Nebulization Once Ellin Mayhew, MD               SLEEPING:    getting better Getting bed by 12am and up by 8am. Sleep time has stabilized. Not eating late anymore.          APPETITE:    good. Better about cooking. Cooks two meals a week and then has left overs          SUBSTANCE USE:  None        SESSION FOCUS:  Addressed:  1. Grief    2. Mixed anxiety depressive disorder    3. Poor sleep        Discussed efforts since last contact.  Doing PT exercises and stretches. Does a set of exercises for 30 minutes every other day  Sleep schedule has stabilized. Not eating late anymore.  Cooks two meals a week and then has left overs  Ordered some crafts - making stars and other Christmas  Take dogs out and let them play  Went to tree lighting  Made a door wreath and put it up. Went to [employer] for items.  Made another door wreath for [employer] third dog, a 2.5 yr old, dachshund, Dixie. Working on training with Dixie      Today's Problem Solving addressed:    Reviewed patient's efforts and linked impact of effort to   1. Grief    2. Mixed anxiety depressive disorder    3. Poor sleep        Addressed:   Grief, mixed anxiety and depression, poor sleep     Stressors:  Health issues  Limited social support. (Primarily supported by older daughter Christy Ibarra.)  Husband with advanced dementia died in his sleep in the morning of 10/26/21  Patient, and her husband, Christy Ibarra, were together 41 years.  Patient was sole caregiver for her husband presented a  willingness to participate in treatment.  PARTICIPATION QUALITY:    {ddparticipationqualities:22407}  MOOD:  {MOOD:23791:a}     AFFECT:  {RRAFFECT:24213}   MENTAL STATUS:     {Mental status:20198}  MODES OF INTERVENTION used in this session:    {Blank multiple:29302::Advocacy,Assessment,Clarification,Cognitive Behavioral,Education,Exploration,Linkage facilitation,Mind Body Stress Reduction Skill Training,Motivational Interviewing,Problem Solving,Support,Reality Testing,Reframing,Week review,Treatment review}      TREATMENT PLAN    Identified treatment goals:   {Blank multiple:19196::***,Reduce PHQ / GAD by 5 points,Get or keep PHQ / GAD under 10 points,stress management,self-care management}     Patient-stated health goal:  ***    Next visit will be   {NUMBERS 1-10:18281} of up to 12 visits.     Return PHONE visit *** *** at *** Stress and self-care management;   []  Referral for outside treatment  []  Bridge to outside treatment  [x]  Other:   Coping skills, resource information, address anticipatory grief     Patient-stated health goal:   Coping skills to get thru the day when I???m totally frustrated by others and what they say and do. Coping skills to overcome the anxiousness.    Next visit will be   7 of up to 12 visits.     Return PHONE visit Wed 12/20 at 2pm

## 2022-10-17 MED ORDER — XOLAIR 150 MG/ML SUBCUTANEOUS SYRINGE
SUBCUTANEOUS | 11 refills | 28 days | Status: CP
Start: 2022-10-17 — End: 2023-10-17

## 2022-10-17 NOTE — Unmapped (Signed)
Addended by: Hassell Done on: 10/17/2022 10:30 AM     Modules accepted: Orders

## 2022-10-19 DIAGNOSIS — J449 Chronic obstructive pulmonary disease, unspecified: Principal | ICD-10-CM

## 2022-10-19 MED ORDER — IPRATROPIUM BROMIDE 0.02 % SOLUTION FOR INHALATION
Freq: Four times a day (QID) | RESPIRATORY_TRACT | 12 refills | 7 days
Start: 2022-10-19 — End: 2023-10-19

## 2022-10-20 NOTE — Unmapped (Signed)
Christy Ibarra 's Xolair shipment will be delayed as a result of needing prescription clarification.      I have reached out to the patient  at [phone] and communicated the delay. We will call the patient back to reschedule the delivery upon resolution. We have not confirmed the new delivery date.

## 2022-10-21 ENCOUNTER — Ambulatory Visit
Admit: 2022-10-21 | Discharge: 2022-10-22 | Payer: MEDICAID | Attending: Physical Medicine & Rehabilitation | Primary: Physical Medicine & Rehabilitation

## 2022-10-21 DIAGNOSIS — M5410 Radiculopathy, site unspecified: Principal | ICD-10-CM

## 2022-10-21 NOTE — Unmapped (Signed)
Thanks so much for coming to see Dr. Riccardo Dubin today. It was a pleasure to meet you. This summary reviews the goals and plans we discussed at your visit today.     Below, you will see: a) your working diagnosis b) your treatment plan and c) your next steps and followup plan.    We care about your quality of life and are committed to helping optimize your functionality.

## 2022-10-21 NOTE — Unmapped (Signed)
[facility]  Physical Medicine and Rehabilitation     Patient Name:Christy Ibarra  MRN: [id number]  DOB: 1956/08/23  Age: 67 y.o.     ----------------------------------------------------------------------------------------------------------------------  October 20, 2022 8:10 PM. Documentation assistance provided by Christy Ibarra, medical scribe, at the direction of Christy Malm, MD.  ----------------------------------------------------------------------------------------------------------------------    ASSESSMENT & PLAN:     10/21/22     DIAGNOSIS:   Bilateral low back pain with radiculitis left LE. H/o shingles- possible post-shingles radiculitis.     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  Continue with home exercises from PT.    NEXT STEPS/FOLLOW UP: PRN  If the pain ever returns, we can order a Lumbar MRI or EMG in the future.    SUBJECTIVE:     Chief Complaint:    Follow-up, Scoliosis, and gait issues  10/21/22:  Pt presents in f/u after receiving lumbar X-ray and ordering EMG. Today pt reports PT has helped to reduce pain, gait has remained the same. Her PCP changed her from gabapentin to Lyrica 25mg  BID, this is more helpful. Pt states her pain is now controlled. She ambulates with a rollator walker. She is also getting stronger through PT. She states that her hands are occasionally weak.    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    Levoconvex curvature of the lumbar spine.  History of Present Illness:   Christy Ibarra is a 66 y.o. year old female being evaluated in consultation at the request of Christy Cancel, MD for Follow-up, Scoliosis, and gait issues    I have reviewed the referring provider's note. Chronic constant midline low back pain w/ no known triggers. Currently pt endorses a suspected stroke vs TIA 3 yrs ago with persistent upper and lower extremity sx. Reports numbness radiates down arms into hands. Denies neck pain.    She also has low back pain w/ BLE radicular sx L>R into the bottom of the feet. Pain radiates down posterior legs (recent Shingles rash located along posterior thigh). She endorses balance deficits w/ unknown cause, and she ambulates w a walker. She already had the radicular pain, but Shingles worsened the pain.    Symptom Location: midline low back, BLE, BUE  Symptom Character: tingling  Symptom Onset/Mechanism: chronic (>/= 3 months), no dinstinctly identifiable mechanism or activity, and no known trauma or injury  Temporal Pattern: constant  NRS Pain Intensity: 5  Aggravating Factors: sitting, walking, lying down  Alleviating Factors: medications (gabapentin), ice  Night Pain: yes   Unintended Weight Loss: no  Fever/Infection:no  Neuromotor Function: motor weakness - (no),  gait/coordination disturbance - (no), loss of bowel or bladder control - (no), saddle anesthesia - (no)     Prior Interventions/Modalities:  - PT currently, helpful  - lidocaine patches, unhelpful  - tylenol  - naproxen 250mg  BID    Meds:  - cymbalta previously, not tolerated  - gabapentin, 100mg  AM + 200mg  PM (helpful)    Prior Diagnostics:  MRI Cervical Spine 05/14/22  Impression   Multilevel cervical spondylosis and degenerative disc disease most pronounced at C5-C6 and mildly progressed since 11/29/2019.     EMG 10/17/19  Conclusions:  Abnormal study.  These electrodiagnostic findings are more consistent with a chronic left S1 radiculopathy with no active  denervation.  There is also electrodiagnostic evidence suggestive of a chronic left C7 radiculopathy.     There is no electrodiagnostic evidence for large fiber neuropathy, mononeuropathy, or myopathy.    Current Outpatient Medications   Medication Sig Dispense Refill    abaloparatide 80 mcg (3,120 mcg/1.56 mL) PnIj Inject 80 mcg under the skin daily. 1.56 mL 6    albuterol 2.5 mg /3 mL (0.083 %) nebulizer solution Inhale 3 mL (2.5 mg total) by nebulization every six (6) hours as needed for wheezing or shortness of breath. 360 mL 11    albuterol HFA 90 mcg/actuation inhaler Inhale 1 puff every four (4) hours as needed for wheezing. 8.5 g 11    budesonide-formoteroL (SYMBICORT) 160-4.5 mcg/actuation inhaler Inhale 2 puffs Two (2) times a day. 10.2 g 11    citalopram (CELEXA) 40 MG tablet Take 1 tablet (40 mg total) by mouth daily. 90 tablet 2    diclofenac sodium (VOLTAREN) 1 % gel Apply 2 g topically four (4) times a day. (Patient not taking: Reported on 07/28/2022) 100 g 0    empty container (SHARPS CONTAINER) Misc Use as directed 1 each 2    EPINEPHrine (EPIPEN) 0.3 mg/0.3 mL injection Inject 0.3 mL (0.3 mg total) into the muscle once as needed for anaphylaxis for up to 1 dose. 2 each 1    hydroxychloroquine (PLAQUENIL) 200 mg tablet Take 1 tablet (200 mg total) by mouth daily. 90 tablet 0    hydrOXYzine (ATARAX) 25 MG tablet Take 1 tablet (25 mg total) by mouth two (2) times a day as needed. 60 tablet 2    ipratropium (ATROVENT) 0.02 % nebulizer solution Inhale the contents of 1 vial (500 mcg total) by nebulization Four (4) times a day. 62.5 mL 12    lidocaine (XYLOCAINE) 5 % ointment Apply topically daily. 141.76 g 0    montelukast (SINGULAIR) 10 mg tablet Take 1 tablet (10 mg total) by mouth daily. 90 tablet 3    omalizumab (XOLAIR) 150 mg/mL syringe Inject 1.5 mL (225 mg total) under the skin every fourteen (14) days. 3 mL 11    omeprazole (PRILOSEC) 20 MG capsule Take 1 capsule (20 mg total) by mouth daily. 90 capsule 3    pen needle, diabetic 31 gauge x 3/16 (5 mm) Ndle Use one new needle each morning for injection 100 each 3    pilocarpine (SALAGEN) 5 MG tablet Take 1 tablet (5 mg total) by mouth Three (3) times a day. 270 tablet 3    pregabalin (LYRICA) 25 MG capsule Take 1 capsule (25 mg total) by mouth Two (2) times a day. 180 capsule 1    tiotropium bromide (SPIRIVA RESPIMAT) 2.5 mcg/actuation inhalation mist Inhale 1 puff daily. 4 g 11    traZODone (DESYREL) 50 MG tablet Take 0.5 tablets (25 mg total) by mouth nightly. 90 tablet 2    triamcinolone (KENALOG) 0.1 % cream Apply topically Two (2) times a day. Apply to back for itching 45 g 0     Current Facility-Administered Medications   Medication Dose Route Frequency Provider Last Rate Last Admin    sodium chloride 3 % nebulizer solution 4 mL  4 mL Nebulization Once Ellin Mayhew, MD           Allergies:   Center-al house dust    Past Medical / Surgical History:     Past Medical History:   Diagnosis Date    Anxiety     Caregiver stress 06/23/2021  Cataract     COPD (chronic obstructive pulmonary disease) (CMS-HCC)     Depression     Dry eyes     Joint pain     Scoliosis        Past Surgical History:   Procedure Laterality Date    HYSTERECTOMY      PR BRONCHOSCOPY,DIAGNOSTIC W LAVAGE Bilateral 09/20/2020    Procedure: BRONCHOSCOPY, RIGID OR FLEXIBLE, INCLUDE FLUOROSCOPIC GUIDANCE WHEN PERFORMED; W/BRONCHIAL ALVEOLAR LAVAGE WITH MODERATE SEDATION;  Surgeon: Sheran Spine, MD;  Location: BRONCH PROCEDURE LAB [facility];  Service: Pulmonary    PR SUBTOT REMV VITREOUS,MECH VIRECTOMY Right 08/26/2020    Procedure: REMOVAL OF VITREOUS, ANTERIOR APPROACH(OPEN SKY TECHNIQUE OR LIMBAL INCISION); SUBTL REMOVAL W/MECH VITRECT;  Surgeon: Jeannene Patella, MD;  Location[facility];  Service: Ophthalmology    PR UPPER GI ENDOSCOPY,BIOPSY N/A 12/11/2021    Procedure: UGI ENDOSCOPY; WITH BIOPSY, SINGLE OR MULTIPLE;  Surgeon: Marene Lenz, MD;  Location: [facility];  Service: Gastroenterology    PR XCAPSL CTRC RMVL INSJ IO LENS PROSTH W/O ECP Left 08/12/2020    Procedure: EXTRACAPSULAR CATARACT REMOVAL W/INSERTION OF INTRAOCULAR LENS PROSTHESIS, MANUAL OR MECHANICAL TECHNIQUE WITHOUT ENDOSCOPIC CYCLOPHOTOCOAGULATION;  Surgeon: Jeannene Patella, MD;  Location: [facility] OR [facility];  Service: Ophthalmology    PR XCAPSL CTRC RMVL INSJ IO LENS PROSTH W/O ECP Right 08/26/2020    Procedure: EXTRACAPSULAR CATARACT REMOVAL W/INSERTION OF INTRAOCULAR LENS PROSTHESIS, MANUAL OR MECHANICAL TECHNIQUE WITHOUT ENDOSCOPIC CYCLOPHOTOCOAGULATION;  Surgeon: Jeannene Patella, MD;  Location: [facility] OR [facility];  Service: Ophthalmology       Social History     Socioeconomic History    Marital status: Single     Spouse name: Ezequiel Ganser    Number of children: 2    Years of education: 12    Highest education level: High school graduate   Tobacco Use    Smoking status: Former     Packs/day: 2.00     Years: 25.00     Additional pack years: 0.00     Total pack years: 50.00     Types: Cigarettes     Quit date: 2009     Years since quitting: 14.9     Passive exposure: Never    Smokeless tobacco: Never   Vaping Use    Vaping Use: Never used   Substance and Sexual Activity    Alcohol use: Not Currently    Drug use: Never    Sexual activity: Yes     Partners: Male   Social History Narrative    Date information was obtained:  06/23/21        Born in [place]    Raised with both parents     Parents deceased: Mother died age 3 due to ovarian cancer. Father died age 31 due to a stroke.    Siblings - an older brother and older sister        Relocated to [place]: yes   If relocated, came to [place]: year: 76   Reason:  Marriage to husband        Residential:    Who lives in the home currently?: Patient and her husband    Housing: own housing    Risk of losing current housing:  no    Homelessness: yes - 1979 -1980    Eviction: no         Food Insecurity: No currently since getting food stamps as of June '22  Transportation: car driven by family        Marital/partner status: Married to husband, Ezequiel Ganser for 41 years. Husband is 7 yrs old.    Sexually active: no     Related arguments:  no         Children: (number and ages, living with) - Daughter age 9, [name], and another daughter age 34, Tresa Endo. Also has stepchildren    Grandchildren - granddaughter age 52 and grandson age 25        Education    Through what year did you complete in school: high school graduate    History of Learning Disability or Learning Difficulty: yes - difficulty processing and understanding        Work    Income/Employment/Disability: Futures trader. 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 Electrical engineer: no     Vocational Rehabilitation: no         Financial planner:  Has not served in the [employer] to Firearms: None     Social Determinants of Health     Financial Resource Strain: High Risk (06/23/2021)    Overall Financial Resource Strain (CARDIA)     Difficulty of Paying Living Expenses: Hard   Food Insecurity: Food Insecurity Present (06/23/2021)    Hunger Vital Sign     Worried About Programme researcher, broadcasting/film/video in the Last Year: Sometimes true     Ran Out of Food in the Last Year: Sometimes true   Transportation Needs: No Transportation Needs (06/23/2021)    PRAPARE - Therapist, art (Medical): No     Lack of Transportation (Non-Medical): No       Family History   Problem Relation Age of Onset    Cancer Mother     Ovarian cancer Mother     Breast cancer Mother 55    Diabetes Father     Heart disease Father     Hypertension Father     Osteoporosis Sister     No Known Problems Daughter     No Known Problems Daughter     No Known Problems Maternal Grandmother     No Known Problems Maternal Grandfather     No Known Problems Paternal Grandmother     No Known Problems Paternal Grandfather     No Known Problems Brother     No Known Problems Other     Colon cancer Neg Hx     Endometrial cancer Neg Hx     BRCA 1/2 Neg Hx        For any of the above entries which indicate no records on file, the patient reports no relevant history.                 Review of Systems:   Review of Systems was completed through a 10 organ system review and is listed in the chart.  Pertinent positives are noted in HPI or flowsheet and otherwise negative.  Patient has been instructed to followup with PCP or appropriate specialist for symptoms outside the purview of this speciality.    OBJECTIVE:     Vitals:     Temp 36.6 ??C (97.9 ??F) (Temporal)  - Ht 172.7 cm (5' 8)  - Wt 60.1 kg (132 lb 6.4 oz)  -  LMP  (LMP Unknown)  - BMI 20.13 kg/m??     The above medications, allergies, history, and ROS, and vitals have been reviewed.    Physical Exam:   GEN: alert and oriented, no apparent distress  HEENT: normocephalic, atraumatic, anicteric, moist mucous membranes  CV: normal heart rate  PULM: normal work of breathing  GI: nondistended  EXT: no swelling, edema in b/l UE and LE  SKIN: no visible ecchymosis or breakdown  PSYCH: normal mood and affect    NEURO:   Manual Muscle Testing    Left Upper Extremity Right Upper Extremity   Shoulder Abduction 4/5 4/5   Elbow Flexion 4+/5 4+/5   Elbow Extension  5/5 5/5   Wrist Extension  5/5 5/5   Finger Abduction  5/5 5/5        Left Lower Extremity Right Lower Extremity   Knee Extension  5/5 5/5   EHL Extension  5/5 5/5         Reflexes     Left Upper Extremity Right Upper Extremity    Triceps 3+ 3+   Biceps 2+ 2+   Brachioradialis 3+ 3+   Hoffman's  negative negative       Left Lower Extremity Right Lower Extremity    Patellar 3+ 3+   Achilles  0 0   Babinski negative negative       Sensory  Sensation to light touch WNL throughout b/l UE and LE    MSK:  Gait and Station: normal nonantalgic gait with symmetric body posture    Cervical Spine:   Inspection: Normal alignment. No erythema, discoloration, or asymmetry.  Palpation: No paraspinal muscle tendereness.  No vertebral body point tenderness.  ROM: normal flexion, extension, rotation, lateral bend  Spurling's: - (negative) LUE, - (negative) RUE    Lumbar Spine:  Inspection: Normal alignment. No erythema, discoloration, or asymmetry.  Palpation: No paraspinal muscle tenderness.  No vertebral body point tenderness.  ROM: normal flexion, extension, rotation, lateral bend    ----------------------------------------------------------------------------------------------------------------------  October 20, 2022 8:10 PM. Documentation assistance provided by Christy Ibarra, medical scribe, at the direction of Christy Malm, MD.  ----------------------------------------------------------------------------------------------------------------------    Tia Masker, MD  Assistant Professor - PM&R  Musculoskeletal and Spine Specialist - [facility] [employer]

## 2022-10-22 ENCOUNTER — Ambulatory Visit: Admit: 2022-10-22 | Discharge: 2022-10-23 | Payer: MEDICAID

## 2022-10-22 DIAGNOSIS — Z7185 Immunization counseling: Principal | ICD-10-CM

## 2022-10-22 DIAGNOSIS — M3501 Sicca syndrome with keratoconjunctivitis: Principal | ICD-10-CM

## 2022-10-22 DIAGNOSIS — M2550 Pain in unspecified joint: Principal | ICD-10-CM

## 2022-10-22 MED ORDER — HYDROXYCHLOROQUINE 200 MG TABLET
ORAL_TABLET | Freq: Every day | ORAL | 3 refills | 90 days | Status: CP
Start: 2022-10-22 — End: ?
  Filled 2022-10-23: qty 90, 90d supply, fill #0

## 2022-10-22 MED ORDER — OMALIZUMAB 75 MG/0.5 ML SUBCUTANEOUS SYRINGE
SUBCUTANEOUS | 11 refills | 28 days | Status: CP
Start: 2022-10-22 — End: 2023-10-22

## 2022-10-22 MED ORDER — PILOCARPINE 5 MG TABLET
ORAL_TABLET | Freq: Three times a day (TID) | ORAL | 3 refills | 90 days | Status: CP
Start: 2022-10-22 — End: 2023-10-22

## 2022-10-22 MED ORDER — XOLAIR 150 MG/ML SUBCUTANEOUS SYRINGE
SUBCUTANEOUS | 11 refills | 28 days | Status: CP
Start: 2022-10-22 — End: 2023-10-22
  Filled 2022-10-23: qty 2, 28d supply, fill #0

## 2022-10-22 NOTE — Unmapped (Signed)
Addended by: Hassell Done on: 10/22/2022 01:09 PM     Modules accepted: Orders

## 2022-10-22 NOTE — Unmapped (Signed)
KARMA KOPE 's Xolair shipment will be sent out  as a result of prescription now clarified.      I have reached out to the patient  at [phone] and communicated the delivery change. We will reschedule the medication for the delivery date that the patient agreed upon.  We have confirmed the delivery date as 12/01, via same day courier.

## 2022-10-23 MED FILL — XOLAIR 75 MG/0.5 ML SUBCUTANEOUS SYRINGE: SUBCUTANEOUS | 28 days supply | Qty: 1 | Fill #0

## 2022-10-23 MED FILL — CITALOPRAM 40 MG TABLET: ORAL | 90 days supply | Qty: 90 | Fill #2

## 2022-11-09 NOTE — Unmapped (Signed)
Christy Ibarra has been contacted in regards to their refill of XOLAIR 75 mg/0.5 mL syringe (omalizumab) and  Xolair 150mg /ml syringe. At this time, they have declined refill due to patient having 2 months of  doses remaining. Refill assessment call date has been updated per the patient's request. Audie Box stated she was not aware her Xolair doses were changed from 28 days to 14 days each.

## 2022-11-10 MED FILL — SPIRIVA RESPIMAT 2.5 MCG/ACTUATION SOLUTION FOR INHALATION: RESPIRATORY_TRACT | 30 days supply | Qty: 4 | Fill #1

## 2022-11-10 MED FILL — SYMBICORT 160 MCG-4.5 MCG/ACTUATION HFA AEROSOL INHALER: RESPIRATORY_TRACT | 30 days supply | Qty: 10.2 | Fill #5

## 2022-11-11 ENCOUNTER — Institutional Professional Consult (permissible substitution): Admit: 2022-11-11 | Payer: MEDICAID | Attending: Clinical | Primary: Clinical
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
## 2022-11-17 NOTE — Unmapped (Signed)
[facility] Endocrinology at [facility]  [address]  [place], [place] [address]    Clinical Pharmacist Note: Manufacturer Assistance Program (MAP) Application    Patient was last seen by Dr. Tawanna Sat MD for osteoporosis management on 03/02/2022, next appointment is on 12/22/2022. She did not have insurance when she was first enrolled in the [employer] [facility] Patient Assistance Program, but now has Medicaid coverage and its unlikely that her re-enrollment will be approved since she has a Arts development officer. Patient was referred to this CPP to complete manufacturer assistance program (MAP) re-enrollment application(s) for the following medication(s):    Medication(s): TYMLOS 80 MCG.    Manufacturer: [employer].     Medication Shipment Location: PATIENT'S HOME.    Patient Portion of Application: PROOF OF INCOME AND MEDICAID INSURANCE CARD PROVIDED. PATIENT SECTIONS REVIEWED, COMPLETED, AND SIGNED BY PATIENT.    Provider Portion of Application: PRESCRIBER SECTIONS COMPLETED, PENDING SIGNATURE FROM ENDOCRINOLOGY PROVIDER.     Prescriptions: PRESCRIPTIONS ARE ON PATIENT'S MEDICATION LIST AND HAS BEEN SENT TO [facility].    Application Faxed on:  NOT YET FAXED, PENDING SIGNATURE FROM ENDOCRINOLOGY PROVIDER (ANTICIPATED FOR 11/24/2022).      Approval: PENDING.    Refill Process:  AUTO REFILLS    Patient was informed that it may take up to 5 business days for their application(s) to be processed once faxed. If approved, it may take an additional 14 business days for medications to be delivered to patient's home.    Advised patient to call:   [facility] [employer] at [phone], Monday through Friday to check on the status of their application.    Patient understands that it is their responsibility to follow up with CPP/Endocrinology provider if there are any issues with their application. She had no additional questions or concerns.     Saddie Benders?? PharmD, CPP, BCACP, CDCES  Clinical Pharmacist Practitioner - Endocrinology  [facility] [place]  Phone: [phone] - Fax: [phone]

## 2022-11-17 NOTE — Unmapped (Incomplete)
Ms. BERYL HORNBERGER,    I will fax your [employer] application for TYMLOS by close of business today OR *** as soon as I receive your supporting documents (proof of income).     Please allow up to 5 business days for your application to be reviewed once faxed. You should receive a decision letter, e-mail, and/or text message directly from the program.     If your enrollment is approved, please allow an additional 14 business days for medication(s) to be shipped from [facility] to your home on or after 11/23/2022.    Please note that it may take the program longer to process your application and/or receive your shipment based on the time of year (peak season is October through March).     Before contacting the Endocrinology clinic, please contact the program directly if you have questions regarding your enrollment or if you need a refill on your medication(s):  [facility] at [phone], Monday through Friday to check on the status of your application.    Please note that this is an annual application and the open enrollment period typically begins 60 days before your current enrollment ends. Please contact the Endocrinology clinic to submit your application for 2025 before your current enrollment ends to prevent gaps in your medication treatment.      Your next appointment with Dr. Tawanna Sat MD is on 12/22/2022.     Saddie Benders?? PharmD, CPP, BCACP, CDCES  Clinical Pharmacist Practitioner - Endocrinology  [facility] [place]  Phone: [phone] - Fax: [phone]

## 2022-11-18 ENCOUNTER — Ambulatory Visit: Admit: 2022-11-18 | Discharge: 2022-11-19 | Payer: MEDICAID | Attending: Ambulatory Care | Primary: Ambulatory Care

## 2022-11-18 DIAGNOSIS — M8000XD Age-related osteoporosis with current pathological fracture, unspecified site, subsequent encounter for fracture with routine healing: Principal | ICD-10-CM

## 2022-11-18 MED ORDER — PEN NEEDLE, DIABETIC 32 GAUGE X 3/16" (5 MM)
3 refills | 0 days | Status: CP
Start: 2022-11-18 — End: ?

## 2022-11-18 MED ORDER — ABALOPARATIDE 80 MCG/DOSE(3,120 MCG/1.56 ML) SUBCUTANEOUS PEN INJECTOR
Freq: Every day | SUBCUTANEOUS | 6 refills | 28.00000 days | Status: CP
Start: 2022-11-18 — End: 2022-11-18

## 2022-11-18 NOTE — Unmapped (Signed)
Patient has been getting Tymlos via [employer] [facility] MAP program and is due for re-enrollment. At time of original enrollment, she did not have insurance. She now has Medicaid and is no longer eligible for [employer] MAP.     PA for Tymlos submitted in [facility] Tracks  11/18/22:   Confirmation #: [id number]   Prior Approval #: [id number]   Status: APPROVED through 11/18/2023.     Called patient to inform her that PA for Tymlos has been approved. Advised her that we will still move forward with submitting the [employer] [facility] application, but it will likely be denied due to active Medicaid insurance. Prescription for Tymlos 80 mcg and pen needles sent to [facility], to be put on file until patient requests (currently has 1 - 2 pens of Tymlos remaining from manufacturer).     Patient verbalized understanding. She had no additional questions or concerns.    Saddie Benders?? PharmD, CPP, BCACP, CDCES, completed 11/18/22 12:42

## 2022-11-26 NOTE — Unmapped (Signed)
I was the supervising physician in the delivery of the service. Nila Nephew, MD

## 2022-12-02 NOTE — Unmapped (Signed)
[facility] MAP re-enrollment application for Tymlos has been signed by Dr. Koren Shiver and faxed 12/02/22 (uploaded to media tab).    Unlikely that patient will be re-approved, given that she now has insurance coverage through [facility]. Patient is aware and is willing to pay Medicaid copay if she is ineligible for MAP.    Saddie Benders?? PharmD, CPP, BCACP, CDCES, completed 12/02/22 11:43

## 2022-12-07 NOTE — Unmapped (Signed)
[facility] Pharm[facility] Refill Coordination Note    Specialty Medication(s) to be Shipped:   CF/Pulmonary/Asthma: Xolair 150mg /ml  Xolair 75mg /0.34ml    Other medication(s) to be shipped:  Symbicort 160-4.77mcg, Spiriva Respimat 2.42mch     Christy Ibarra, DOB: 08/26/1956  Phone: [phone] (home)       All above HIPAA information was verified with patient.     Was a Nurse, learning disability used for this call? No    Completed refill call assessment today to schedule patient's medication shipment from the [facility] [phone]).  All relevant notes have been reviewed.     Specialty medication(s) and dose(s) confirmed: Regimen is correct and unchanged.   Changes to medications: Natash reports no changes at this time.  Changes to insurance: No  New side effects reported not previously addressed with a pharmacist or physician: None reported  Questions for the pharmacist: No    Confirmed patient received a Conservation officer, historic buildings and a Surveyor, mining with first shipment. The patient will receive a drug information handout for each medication shipped and additional FDA Medication Guides as required.       DISEASE/MEDICATION-SPECIFIC INFORMATION        For patients on injectable medications: Patient currently has 1 doses left.  Next injection is scheduled for 12/11/22.    SPECIALTY MEDICATION ADHERENCE     Medication Adherence    Specialty Medication: XOLAIR 150 mg/mL syringe (omalizumab)  Patient is on additional specialty medications: Yes  Additional Specialty Medications: XOLAIR 75 mg/0.5 mL syringe (omalizumab)                                Were doses missed due to medication being on hold? No    XOLAIR 75 mg/0.5 mL syringe (omalizumab)  : 7 days of medicine on hand   XOLAIR 150 mg/mL syringe (omalizumab)  : 7 days of medicine on hand       REFERRAL TO PHARMACIST     Referral to the pharmacist: Not needed      SHIPPING     Shipping address confirmed in Epic.     Delivery Scheduled: Yes, Expected medication delivery date: 12/21/22.     Medication will be delivered via Same Day Courier to the prescription address in Epic WAM.    Reneisha Stilley' W Danae Chen Shared [facility] Specialty Technician

## 2022-12-09 ENCOUNTER — Institutional Professional Consult (permissible substitution): Admit: 2022-12-09 | Discharge: 2022-12-10 | Payer: MEDICAID | Attending: Clinical | Primary: Clinical

## 2022-12-09 NOTE — Unmapped (Signed)
INTERNAL MEDICINE COUNSELING SESSION NOTE    LENGTH OF SESSION:  Psychotherapy Session 30 minutes    TYPE OF PSYCHOTHERAPY: Behavioral, Supportive    REASON FOR TREATMENT: Problem Solving Treatment and Mind Body Stress Reduction skill training for caregiver stress, mixed anxiety and depression; link impact of effort to mood.     [id number]}      The patient reports they are physically located in [place] and is currently: at home. I conducted a phone visit.  I spent 30 minutes on the phone call with the patient on the date of service .  Spent additional 10 minutes before and after visit.          REVIEW OF FUNCTIONING SINCE LAST VISIT    MOOD (0-10) =    pretty good  Rating deferred    SATISFACTION WITH EFFORT IN CARING FOR MOOD (0-10) =    Rating deferred    PAIN:     5  - It never goes away. I manage it.   At last visit was:  5    PHQ-9 SCORE:     Rating deferred    GAD-7 SCORE:     Rating deferred      CURRENT SUICIDAL/HOMICIDAL IDEATION:  None      PROBLEM LIST:    Patient Active Problem List   Diagnosis    Abnormal gait    Anxiety    Arthralgia of hip    Family history of genetic disorder    Family history of malignant neoplasm of breast    Family history of malignant neoplasm of cervix    Mixed anxiety depressive disorder    Scoliosis    Sjogren's syndrome with keratoconjunctivitis sicca (CMS-HCC)    Leukopenia    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  yes     Any changes in medication since last visit?  no   New allergy medicine to 1 shot every 2 wks instead of 1x a month mentioned 11/11/22 - had a couple days of feeling blah and tired and wondered if it was a reaction to new medication. Thinks medication is opening sinuses and is sniffling more.    Any missed doses? no       Current Outpatient Medications on File Prior to Visit   Medication Sig Dispense Refill    abaloparatide 80 mcg (3,120 mcg/1.56 mL) PnIj Inject 80 mcg under the skin daily. 1.56 mL 6    albuterol 2.5 mg /3 mL (0.083 %) nebulizer solution Inhale 3 mL (2.5 mg total) by nebulization every six (6) hours as needed for wheezing or shortness of breath. 360 mL 11    albuterol HFA 90 mcg/actuation inhaler Inhale 1 puff every four (4) hours as needed for wheezing. 8.5 g 11    budesonide-formoterol (SYMBICORT) 160-4.5 mcg/actuation inhaler Inhale 2 puffs Two (2) times a day. 10.2 g 11    citalopram (CELEXA) 40 MG tablet Take 1 tablet (40 mg total) by mouth daily. 90 tablet 2    diclofenac sodium (VOLTAREN) 1 % gel Apply 2 g topically four (4) times a day. (Patient not taking: Reported on 07/28/2022) 100 g 0    empty container (SHARPS CONTAINER) Misc  Use as directed 1 each 2    EPINEPHrine (EPIPEN) 0.3 mg/0.3 mL injection Inject 0.3 mL (0.3 mg total) into the muscle once as needed for anaphylaxis for up to 1 dose. 2 each 1    hydroxychloroquine (PLAQUENIL) 200 mg tablet Take 1 tablet (200 mg total) by mouth daily. 90 tablet 3    hydrOXYzine (ATARAX) 25 MG tablet Take 1 tablet (25 mg total) by mouth two (2) times a day as needed. 60 tablet 2    ipratropium (ATROVENT) 0.02 % nebulizer solution Inhale the contents of 1 vial (500 mcg total) by nebulization Four (4) times a day. 62.5 mL 12    lidocaine (XYLOCAINE) 5 % ointment Apply topically daily. 141.76 g 0    montelukast (SINGULAIR) 10 mg tablet Take 1 tablet (10 mg total) by mouth daily. 90 tablet 3    omalizumab (XOLAIR) 150 mg/mL syringe Inject the contents of 1 syringe (150 mg total) under the skin every fourteen (14) days. 2 mL 11    omalizumab (XOLAIR) 75 mg/0.5 mL syringe Inject the contents of 1 syringe (75 mg total) under the skin every fourteen (14) days. 1 mL 11    omeprazole (PRILOSEC) 20 MG capsule Take 1 capsule (20 mg total) by mouth daily. 90 capsule 3    pen needle, diabetic 32 gauge x 3/16 (5 mm) Ndle Use one pen needle daily to inject Tymlos. 100 each 3    pilocarpine (SALAGEN) 5 MG tablet Take 1 tablet (5 mg total) by mouth Three (3) times a day. 270 tablet 3    pregabalin (LYRICA) 25 MG capsule Take 1 capsule (25 mg total) by mouth Two (2) times a day. 180 capsule 1    tiotropium bromide (SPIRIVA RESPIMAT) 2.5 mcg/actuation inhalation mist Inhale 1 puff daily. 4 g 11    traZODone (DESYREL) 50 MG tablet Take 0.5 tablets (25 mg total) by mouth nightly. 90 tablet 2    triamcinolone (KENALOG) 0.1 % cream Apply topically Two (2) times a day. Apply to back for itching 45 g 0     Current Facility-Administered Medications on File Prior to Visit   Medication Dose Route Frequency Provider Last Rate Last Admin    sodium chloride 3 % nebulizer solution 4 mL  4 mL Nebulization Once Ellin Mayhew, MD               SLEEPING:    still up and down getting between 5 to 6 hours of sleep.  No napping, may doze unintentionally. No long periods of dozing.          APPETITE:    pretty good   She's cooking some. Buying some ready made meals (salads)  Eating at friend or daughter's          SUBSTANCE USE:  deferred        SESSION FOCUS:  Addressed:  1. Grief    2. Mixed anxiety depressive disorder    3. Poor sleep        Discussed efforts since last contact.  Despite it being cold I'm getting out with my friend.  Putting up with friend's complaining or will try to change the subject.  She's cooking some. Buying some ready made meals (salads)  Eating at friend or daughter's.          Today's Problem Solving addressed:    Reviewed patient's efforts and linked impact of effort to   1. Grief    2. Mixed anxiety depressive disorder  3. Poor sleep        Addressed:   Grief, mixed anxiety and depression, poor sleep     Stressors:  Health issues  Limited social support. (Primarily supported by older daughter [name].)  Husband with advanced dementia died in his sleep in the morning of 10/27/2021  Patient, and her husband, Doreatha Martin, were together 41 years.  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 Some unintentional dozing during the day.      Grief, mixed anxiety and depression   Christmas was challenging.   Second Christmas since husband, Sam, died 2021-10-27  She made efforts not to get caught up in grief.  Daughter, Heather's mother in law died Christmas eve.  Mother in law didn't leave a will.  [name] has been stressed and hasn't been able to express that with her husband, so patient says [name] has been short with her.  Ms. Abello reflected on, and we continued to process, 6 yrs of care giving and 3 yrs of that overlapped with COVID which worsened with isolation.        Discussed issues presented - daughter's additional loss along with death of her father (patient's husband), stressors in her marriage  and legal, financial issues.  Explored meaning and impact.  Clinician validated impacts and challenges for her and her daughter [name].  No indication for concern about daughter's behavior.  Clinician reframed and provided psychoeducation regarding impact of loss for daughter and different grieving by [name] and her husband, challenges [name] is facing and benefit, as her mother, of reflecting and validating Heather. A valuable role patient can take and ways Ms. Pinela can support daughter through this time.            Additional:     Medication adherence and barriers to the treatment plan have been addressed. Opportunities to optimize healthy behaviors have been discussed. Patient / caregiver voiced understanding.                 ASSESSMENT      ENGAGEMENT: Patient presented a willingness to participate in treatment.  PARTICIPATION QUALITY:    Active  MOOD:  PRETTY GOOD     AFFECT:  CONGRUENT   MENTAL STATUS:     alert and oriented  MODES OF INTERVENTION used in this session:    Clarification, Exploration, Problem Solving, Support, Reframing, or Week review      TREATMENT PLAN    Short term goal focused counseling for identified treatment goals:   [x]  Reduce PHQ / GAD by 5 points  []  Get or keep PHQ / GAD under 10 points  [x]  Stress and self-care management;   []  Referral for outside treatment  []  Bridge to outside treatment  [x]  Other:   Coping skills, resource information, address anticipatory grief     Patient-stated health goal:   Coping skills to get thru the day when I???m totally frustrated by others and what they say and do. Coping skills to overcome the anxiousness.    Next visit will be   9 of up to 12 visits.     Return PHONE visit Wed 2/7 at 2pm.

## 2022-12-11 ENCOUNTER — Ambulatory Visit
Admission: EM | Admit: 2022-12-11 | Discharge: 2022-12-11 | Disposition: A | Discharge status: Home / Self Care | Payer: Medicaid Other

## 2022-12-11 DIAGNOSIS — J441 Chronic obstructive pulmonary disease with (acute) exacerbation: Secondary | ICD-10-CM

## 2022-12-11 DIAGNOSIS — J069 Acute upper respiratory infection, unspecified: Secondary | ICD-10-CM | POA: Diagnosis not present

## 2022-12-11 MED ORDER — PREDNISONE 20 MG PO TABS: 60.0000 mg | ORAL_TABLET | Freq: Every day | ORAL | 0 refills | Status: AC

## 2022-12-11 MED ORDER — LEVOFLOXACIN 500 MG PO TABS: 500.0000 mg | ORAL_TABLET | Freq: Every day | ORAL | 0 refills | Status: AC

## 2022-12-11 NOTE — ED Provider Notes (Signed)
Roderic Palau    CSN: [id number] Arrival date & time: 12/11/22  1332      History   Chief Complaint Chief Complaint  Patient presents with   Cough   Shortness of Breath    HPI Shelby Ibarra is a 67 y.o. female.    Cough Associated symptoms: shortness of breath   Shortness of Breath Associated symptoms: cough     Patient presents to urgent care with complaint of nonproductive cough and shortness of breath x 2 days.  Endorses history of COPD and reports increased use of O2, rescue inhaler, nebulization.  Past Medical History:  Diagnosis Date   Anxiety    Asthma    Cancer ([place])    Cervical cancer ([place]) 1999   COPD (chronic obstructive pulmonary disease) ([place])    Depression    History of home oxygen therapy    2l/     Patient Active Problem List   Diagnosis Date Noted   Closed fracture of right hip [facility])    Fall    Surgery, elective    Fever    Protein-calorie malnutrition, severe 06/27/2021   S/P right hip fracture 06/26/2021    Past Surgical History:  Procedure Laterality Date   TOTAL HIP ARTHROPLASTY Right 06/27/2021   Procedure: TOTAL HIP ARTHROPLASTY ANTERIOR APPROACH;  Surgeon: Lovell Sheehan, MD;  Location: [facility] ORS;  Service: Orthopedics;  Laterality: Right;    OB History   No obstetric history on file.      Home Medications    Prior to Admission medications   Medication Sig Start Date End Date Taking? Authorizing Provider  hydroxychloroquine (PLAQUENIL) 200 MG tablet Take 1 tablet by mouth daily. 10/22/22  Yes [provider]  Insulin Pen Needle (EASY TOUCH PEN NEEDLES) 32G X 5 MM MISC Use one pen needle daily to inject Tymlos. 11/18/22  Yes [provider]  ipratropium (ATROVENT) 0.02 % nebulizer solution Inhale into the lungs. 07/06/22 07/06/23 Yes [provider]  pilocarpine (SALAGEN) 5 MG tablet Take by mouth. 10/22/22 10/22/23 Yes [provider]  pregabalin (LYRICA) 25 MG capsule Take by  mouth. 08/17/22 08/17/23 Yes [provider]  Sharps Container (BD SHARPS COLLECTOR) [place] See admin instructions. 02/19/22  Yes [provider]  Abaloparatide 3120 MCG/1.56ML SOPN Inject into the skin. 03/02/22   [provider]  albuterol (VENTOLIN HFA) 108 (90 Base) MCG/ACT inhaler Inhale 2 puffs into the lungs every 6 (six) hours as needed for wheezing or shortness of breath. 05/09/19   Arta Silence, MD  budesonide-formoterol (SYMBICORT) 160-4.5 MCG/ACT inhaler Inhale 2 puffs into the lungs 2 (two) times daily. 03/24/21 03/29/22  [provider]  citalopram (CELEXA) 40 MG tablet Take 40 mg by mouth daily. 03/25/21 03/29/22  [provider]  diclofenac Sodium (VOLTAREN) 1 % GEL Apply topically. 03/20/22 03/20/23  [provider]  EPINEPHrine 0.3 mg/0.3 mL IJ SOAJ injection Inject into the muscle. 03/18/22   [provider]  hydrOXYzine (ATARAX) 25 MG tablet Take by mouth. 02/17/22 02/17/23  [provider]  lidocaine (XYLOCAINE) 5 % ointment Apply topically daily. 03/20/22 03/20/23  [provider]  montelukast (SINGULAIR) 10 MG tablet Take 1 tablet by mouth daily. 09/03/21 09/03/22  [provider]  omalizumab Arvid Right) 150 MG/ML prefilled syringe Inject into the skin. 03/18/22 03/18/23  [provider]  omeprazole (PRILOSEC) 20 MG capsule Take 1 capsule by mouth daily. 03/20/22 03/20/23  [provider]  sodium chloride HYPERTONIC 3 % nebulizer solution  Inhale into the lungs. 03/18/22   [provider]  Tiotropium Bromide Monohydrate 2.5 MCG/ACT AERS Inhale 1 puff into the lungs daily at 12 noon. 01/13/21 03/29/22  [provider]  traZODone (DESYREL) 50 MG tablet Take by mouth. 02/17/22   [provider]  valACYclovir (VALTREX) 1000 MG tablet Take 1 tablet (1,000 mg total) by mouth 3 (three) times daily. 03/29/22   Sharion Balloon, NP    Family History Family History  Problem Relation  Age of Onset   Breast cancer Mother 65       Deceased   Ovarian cancer Mother 74   Breast cancer Maternal Grandmother        approximately 29's    Social History Social History   Tobacco Use   Smoking status: Former    Packs/day: 2.00    Years: 25.00    Total pack years: 50.00    Types: Cigarettes    Quit date: 10/22/2009    Years since quitting: 13.1   Smokeless tobacco: Never  Vaping Use   Vaping Use: Never used  Substance Use Topics   Alcohol use: No    Alcohol/week: 0.0 standard drinks of alcohol   Drug use: No     Allergies   Patient has no known allergies.   Review of Systems Review of Systems  Respiratory:  Positive for cough and shortness of breath.      Physical Exam Triage Vital Signs ED Triage Vitals  Enc Vitals Group     BP 12/11/22 1342 129/78     Pulse Rate 12/11/22 1342 (!) 111     Resp 12/11/22 1342 20     Temp 12/11/22 1342 99.6 F (37.6 C)     Temp Source 12/11/22 1342 Oral     SpO2 12/11/22 1342 90 %     Weight --      Height --      Head Circumference --      Peak Flow --      Pain Score 12/11/22 1343 0     Pain Loc --      Pain Edu? --      Excl. in [place]? --    No data found.  Updated Vital Signs BP 129/78 (BP Location: Right Arm)   Pulse (!) 111   Temp 99.6 F (37.6 C) (Oral)   Resp 20   LMP 10/22/1998   SpO2 90%   Visual Acuity Right Eye Distance:   Left Eye Distance:   Bilateral Distance:    Right Eye Near:   Left Eye Near:    Bilateral Near:     Physical Exam Vitals reviewed.  Constitutional:      Appearance: She is well-developed. She is ill-appearing.  Pulmonary:     Breath sounds: Examination of the right-upper field reveals wheezing. Examination of the left-upper field reveals wheezing. Examination of the right-middle field reveals wheezing. Examination of the left-middle field reveals wheezing. Examination of the right-lower field reveals wheezing. Examination of the left-lower field reveals wheezing.  Decreased breath sounds and wheezing present.  Skin:    General: Skin is warm and dry.  Neurological:     General: No focal deficit present.     Mental Status: She is alert and oriented to person, place, and time.  Psychiatric:        Mood and Affect: Mood normal.        Behavior: Behavior normal.      UC Treatments / Results  Labs (all  labs ordered are listed, but only abnormal results are displayed) Labs Reviewed - No data to display  EKG   Radiology No results found.  Procedures Procedures (including critical care time)  Medications Ordered in UC Medications - No data to display  Initial Impression / Assessment and Plan / UC Course  I have reviewed the triage vital signs and the nursing notes.  Pertinent labs & imaging results that were available during my care of the patient were reviewed by me and considered in my medical decision making (see chart for details).   Patient is afebrile here without recent antipyretics. Satting 90% on room air. Overall is ill appearing, though well hydrated, without respiratory distress. Pulmonary exam is remarkable for audible wheezes.  Patient's symptoms are consistent with an acute viral syndrome with COPD exacerbation.  Will give her levofloxacin per protocol for COPD exacerbation as well as a course of prednisone to try to reduce her acute bronchial inflammation.  Final Clinical Impressions(s) / UC Diagnoses   Final diagnoses:  None   Discharge Instructions   None    ED Prescriptions   None    PDMP not reviewed this encounter.   Rose Phi, [place] 12/11/22 1356

## 2022-12-11 NOTE — Discharge Instructions (Signed)
Follow up here or with your primary care provider if your symptoms are worsening or not improving.     

## 2022-12-11 NOTE — ED Triage Notes (Signed)
Pt. Presents to UC w/ c/o a non-productive cough and SOB for the past 2 days. Pt. Has hx of COPD and states for the past two days she has had to increase her use of O2, rescue inhaler and nebulization treatments. Pt. Also sates she has used a lot of her albuterol treatment today.

## 2022-12-21 MED FILL — SPIRIVA RESPIMAT 2.5 MCG/ACTUATION SOLUTION FOR INHALATION: RESPIRATORY_TRACT | 30 days supply | Qty: 4 | Fill #2

## 2022-12-21 MED FILL — SYMBICORT 160 MCG-4.5 MCG/ACTUATION HFA AEROSOL INHALER: RESPIRATORY_TRACT | 30 days supply | Qty: 10.2 | Fill #6

## 2022-12-21 MED FILL — XOLAIR 150 MG/ML SUBCUTANEOUS SYRINGE: SUBCUTANEOUS | 28 days supply | Qty: 2 | Fill #1

## 2022-12-21 MED FILL — XOLAIR 75 MG/0.5 ML SUBCUTANEOUS SYRINGE: SUBCUTANEOUS | 28 days supply | Qty: 1 | Fill #1

## 2022-12-22 ENCOUNTER — Ambulatory Visit: Admit: 2022-12-22 | Discharge: 2022-12-23 | Payer: MEDICAID

## 2022-12-22 DIAGNOSIS — M8000XD Age-related osteoporosis with current pathological fracture, unspecified site, subsequent encounter for fracture with routine healing: Principal | ICD-10-CM

## 2022-12-22 DIAGNOSIS — E559 Vitamin D deficiency, unspecified: Principal | ICD-10-CM

## 2022-12-22 LAB — BASIC METABOLIC PANEL
ANION GAP: 4 mmol/L — ABNORMAL LOW (ref 5–14)
BLOOD UREA NITROGEN: 9 mg/dL (ref 9–23)
BUN / CREAT RATIO: 14
CALCIUM: 9.7 mg/dL (ref 8.7–10.4)
CHLORIDE: 107 mmol/L (ref 98–107)
CO2: 30.7 mmol/L (ref 20.0–31.0)
CREATININE: 0.64 mg/dL
EGFR CKD-EPI (2021) FEMALE: >90 mL/min/{1.73_m2} (ref >=60)
GLUCOSE RANDOM: 99 mg/dL (ref 70–179)
POTASSIUM: 3.7 mmol/L (ref 3.4–4.8)
SODIUM: 142 mmol/L (ref 135–145)

## 2022-12-22 NOTE — Unmapped (Signed)
Reason for follow up: severe osteoporosis.    Referring Provider: Johnsie Cancel    Primary Care Provider: Johnsie Cancel, MD      Assessment/Plan:       1. Severe Osteoporosis with current pathological fracture, unspecified osteoporosis type, initial encounter  2. Vitamin d deficiency  - With previous fragility fracture, R hip fracture 06/2021 s/p THA, fall from standing height (slipped in wet floor).   - Risk factors for osteoporosis identified: Menopause, Sjogren, Hydroxychloroquine  - additional work up for secondary causes of osteoporosis: normal calcium, vitamin d, phosphorus, alk phos, renal function, TSH. No clinical signs of Cushing's. Patient does not have contraindications for PTH analog, no hypercalcemia, renal stones, cancer, bone mets or radiation. Has normal Alk Phos   - started Tymlos 01/2022, tolerating well without skin side effects and normal calcium.   - on vit d 5000 units every day   Plan:  - c/w Tymlos 80 mg every day, with goal to complete 2 yrs (around 01/2024), after which will give Reclast 5 mg IV for 2-3 doses depending on how much BMD improvement we obtain. We discussed expected BMD gain at spine ~10% and hip ~3% from abaloparatide.   - monitor vitamin d, calcium and renal function while on therapy (check today)  - continue with vitamin d supplementation to keep goal vitamin d > 30 and calcium intake/supplementation equivalent to 1000-1200 mg daily.  - monitor DXA scan every 2 years (due 12/2023)    All questions were answered and patient agrees with plan.     Return in about 6 months (around 06/22/2023) for [employer], In-person.      Thank you for referring your patient to our endocrine clinic for evaluation. Please do not hesitate to contact me with any questions.     Tawanna Sat, MD  [facility] Endocrinology  Phone [phone]  Fax [phone]    Subjective:       Christy Ibarra is a 67 y.o. female with history of Anxiety/Depression, Esophageal dysphagia, Sjogrens Syndrome, Asthma-COPD, Bronchiectasis, who is seen at the request of Johnsie Cancel in here for follow up of severe osteoporosis. Last seen on 03/02/22    Interval hx 12/22/22    Patient has been on Tymlos since ~ 01/2022. Tolerating well   Remains on vit d 5000 units every day    Interval hx 03/02/22    Tymlos/Abaloparatide approved, patient received training by nurse on injections. Has been injecting for about 1 months. Denies skin rash. Recent calcium and renal function normal on 02/17/22. Reports baseline joint pain from her underlying rheumatologic condition, unchanged since tymlos started.     She completed therapy since her hip fractures. Daughter has done home modifications to minimize falls. She ambulates with walker at home. No falls since last visit.     Comes with [name] daughter.     Initial encounter 12/02/21    Pertinent Bone health History:  Fracture: R hip fracture 06/2021 s/p THA, fall from standing height (slipped in wet floor)  Fracture in Family members: Sister has Osteoporosis and shoulder fracture (does not know mechanism)  Previous Medication use:  none  Hyperthyroidism: denied  Gonadal Function:  Menarche: About 67 years old  Menopause: About 16s years old.   Vitamin D: Vitamin D 5000 every day   Calcium: No calcium tablets . Almond milk 2 cups a day, yogurt 2 cups every other day.   Kidney Stones: denied  Hypercalcemia: denies  Steroid use: Reports for 5 months  once, and occasionally needs steroids once every 2 years or so for short periods of time.   Changes in weight: stable  Smoking: for 25 years, stopped 2019  Alcohol:  denied  Malabsorption: no chronic diarrhea  History of early loss of primary teeth: No teeth for years, attributed to Sjogren. Started losing teeth in her 27s.   Anemia: denied  Falls in the past month: denied  Exercise:   Vegan/Vegetarian diet: denies  Rheumatoid Arthritis (or other autoimmune conditions): Sjogrens, taking hydroxychloroquine    Has history of cervical cancer s/p hysterectomy in her 40s, reports completely removed, did not need chemo or radiation.     Mobility was not optimal prior to hip fracture, since fracture she has been requiring walker or cane at home, occasionally wheelchair.     Dental work needed?: No teeth, no dentures or implants.     Family History: Denies history of fractures, hypercalcemia or kidney stones in the family. Denies family history of pituitary, hypothalamic, thyroid, parathyroid, adrenal or pancreatic tumors.    Social history:   She lives by herself, husband passed. She is able to do most ADL independently. Daughter does cleaning and laundry.       Past Medical History:   Diagnosis Date    Anxiety     Caregiver stress 06/23/2021    Cataract     COPD (chronic obstructive pulmonary disease) (CMS-HCC)     Depression     Dry eyes     Joint pain     Scoliosis      Allergies   Allergen Reactions    [facility] Dust Cough     Social History     Socioeconomic History    Marital status: Single     Spouse name: Ezequiel Ganser    Number of children: 2    Years of education: 12    Highest education level: High school graduate   Tobacco Use    Smoking status: Former     Current packs/day: 0.00     Average packs/day: 2.0 packs/day for 25.0 years (50.0 ttl pk-yrs)     Types: Cigarettes     Start date: 62     Quit date: 2009     Years since quitting: 15.0     Passive exposure: Never    Smokeless tobacco: Never   Vaping Use    Vaping Use: Never used   Substance and Sexual Activity    Alcohol use: Not Currently    Drug use: Never    Sexual activity: Yes     Partners: Male   Social History Narrative    Date information was obtained:  06/23/21        Born in [place]    Raised with both parents     Parents deceased: Mother died age 54 due to ovarian cancer. Father died age 50 due to a stroke.    Siblings - an older brother and older sister        Relocated to [place]: yes   If relocated, came to [place]: year: 4   Reason:  Marriage to husband        Residential: Who lives in the home currently?: Patient and her husband    Housing: own housing    Risk of losing current housing:  no    Homelessness: yes - 1979 -1980    Eviction: no         Food Insecurity: No currently since getting food stamps as of June '  22        Transportation: car driven by family        Marital/partner status: Married to husband, Ezequiel Ganser for 41 years. Husband is 27 yrs old.    Sexually active: no     Related arguments:  no         Children: (number and ages, living with) - Daughter age 89, [name], and another daughter age 35, Tresa Endo. Also has stepchildren    Grandchildren - granddaughter age 36 and grandson age 52        Education    Through what year did you complete in school: high school graduate    History of Learning Disability or Learning Difficulty: yes - difficulty processing and understanding        Work    Income/Employment/Disability: Futures trader. 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 Electrical engineer: no     Vocational Rehabilitation: no         Financial planner:  Has not served in the [employer] to Firearms: None     Social Determinants of Health     Financial Resource Strain: Medium Risk (11/04/2022)    Overall Financial Resource Strain (CARDIA)     Difficulty of Paying Living Expenses: Somewhat hard   Food Insecurity: Food Insecurity Present (11/04/2022)    Hunger Vital Sign     Worried About Running Out of Food in the Last Year: Sometimes true     Ran Out of Food in the Last Year: Sometimes true   Transportation Needs: No Transportation Needs (11/04/2022)    PRAPARE - Therapist, art (Medical): No     Lack of Transportation (Non-Medical): No      Past Surgical History:   Procedure Laterality Date    HYSTERECTOMY      PR BRONCHOSCOPY,DIAGNOSTIC W LAVAGE Bilateral 09/20/2020    Procedure: BRONCHOSCOPY, RIGID OR FLEXIBLE, INCLUDE FLUOROSCOPIC GUIDANCE WHEN PERFORMED; W/BRONCHIAL ALVEOLAR LAVAGE WITH MODERATE SEDATION;  Surgeon: Sheran Spine, MD;  Location: BRONCH PROCEDURE LAB [facility];  Service: Pulmonary    PR SUBTOT REMV VITREOUS,MECH VIRECTOMY Right 08/26/2020    Procedure: REMOVAL OF VITREOUS, ANTERIOR APPROACH(OPEN SKY TECHNIQUE OR LIMBAL INCISION); SUBTL REMOVAL W/MECH VITRECT;  Surgeon: Jeannene Patella, MD;  Location[facility] OR [facility];  Service: Ophthalmology    PR UPPER GI ENDOSCOPY,BIOPSY N/A 12/11/2021    Procedure: UGI ENDOSCOPY; WITH BIOPSY, SINGLE OR MULTIPLE;  Surgeon: Marene Lenz, MD;  Location: [facility];  Service: Gastroenterology    PR XCAPSL CTRC RMVL INSJ IO LENS PROSTH W/O ECP Left 08/12/2020    Procedure: EXTRACAPSULAR CATARACT REMOVAL W/INSERTION OF INTRAOCULAR LENS PROSTHESIS, MANUAL OR MECHANICAL TECHNIQUE WITHOUT ENDOSCOPIC CYCLOPHOTOCOAGULATION;  Surgeon: Jeannene Patella, MD;  [facility];  Service: Ophthalmology    PR XCAPSL CTRC RMVL INSJ IO LENS PROSTH W/O ECP Right 08/26/2020    Procedure: EXTRACAPSULAR CATARACT REMOVAL W/INSERTION OF INTRAOCULAR LENS PROSTHESIS, MANUAL OR MECHANICAL TECHNIQUE WITHOUT ENDOSCOPIC CYCLOPHOTOCOAGULATION;  Surgeon: Jeannene Patella, MD;  Location: [facility] OR [facility];  Service: Ophthalmology        Current Outpatient Medications:     abaloparatide 80 mcg (3,120 mcg/1.56 mL) PnIj, Inject 80 mcg under the skin daily., Disp: 1.56 mL, Rfl: 6  albuterol HFA 90 mcg/actuation inhaler, Inhale 1 puff every four (4) hours as needed for wheezing., Disp: 8.5 g, Rfl: 11    budesonide-formoterol (SYMBICORT) 160-4.5 mcg/actuation inhaler, Inhale 2 puffs Two (2) times a day., Disp: 10.2 g, Rfl: 11    citalopram (CELEXA) 40 MG tablet, Take 1 tablet (40 mg total) by mouth daily., Disp: 90 tablet, Rfl: 2    diclofenac sodium (VOLTAREN) 1 % gel, Apply 2 g topically four (4) times a day., Disp: 100 g, Rfl: 0    empty container (SHARPS CONTAINER) Misc, Use as directed, Disp: 1 each, Rfl: 2    EPINEPHrine (EPIPEN) 0.3 mg/0.3 mL injection, Inject 0.3 mL (0.3 mg total) into the muscle once as needed for anaphylaxis for up to 1 dose., Disp: 2 each, Rfl: 1    hydroxychloroquine (PLAQUENIL) 200 mg tablet, Take 1 tablet (200 mg total) by mouth daily., Disp: 90 tablet, Rfl: 3    hydrOXYzine (ATARAX) 25 MG tablet, Take 1 tablet (25 mg total) by mouth two (2) times a day as needed., Disp: 60 tablet, Rfl: 2    ipratropium (ATROVENT) 0.02 % nebulizer solution, Inhale the contents of 1 vial (500 mcg total) by nebulization Four (4) times a day., Disp: 62.5 mL, Rfl: 12    lidocaine (XYLOCAINE) 5 % ointment, Apply topically daily., Disp: 141.76 g, Rfl: 0    montelukast (SINGULAIR) 10 mg tablet, Take 1 tablet (10 mg total) by mouth daily., Disp: 90 tablet, Rfl: 3    omalizumab (XOLAIR) 150 mg/mL syringe, Inject the contents of 1 syringe (150 mg total) under the skin every fourteen (14) days., Disp: 2 mL, Rfl: 11    omalizumab (XOLAIR) 75 mg/0.5 mL syringe, Inject the contents of 1 syringe (75 mg total) under the skin every fourteen (14) days., Disp: 1 mL, Rfl: 11    omeprazole (PRILOSEC) 20 MG capsule, Take 1 capsule (20 mg total) by mouth daily., Disp: 90 capsule, Rfl: 3    pen needle, diabetic 32 gauge x 3/16 (5 mm) Ndle, Use one pen needle daily to inject Tymlos., Disp: 100 each, Rfl: 3    pilocarpine (SALAGEN) 5 MG tablet, Take 1 tablet (5 mg total) by mouth Three (3) times a day., Disp: 270 tablet, Rfl: 3    pregabalin (LYRICA) 25 MG capsule, Take 1 capsule (25 mg total) by mouth Two (2) times a day., Disp: 180 capsule, Rfl: 1    tiotropium bromide (SPIRIVA RESPIMAT) 2.5 mcg/actuation inhalation mist, Inhale 1 puff daily., Disp: 4 g, Rfl: 11    albuterol 2.5 mg /3 mL (0.083 %) nebulizer solution, Inhale 3 mL (2.5 mg total) by nebulization every six (6) hours as needed for wheezing or shortness of breath., Disp: 360 mL, Rfl: 11    traZODone (DESYREL) 50 MG tablet, Take 0.5 tablets (25 mg total) by mouth nightly., Disp: 90 tablet, Rfl: 2    triamcinolone (KENALOG) 0.1 % cream, Apply topically Two (2) times a day. Apply to back for itching, Disp: 45 g, Rfl: 0    Current Facility-Administered Medications:     sodium chloride 3 % nebulizer solution 4 mL, 4 mL, Nebulization, Once, Ellin Mayhew, MD      Review of Systems  A 12 point review of systems was otherwise negative except as noted in the HPI.      Objective:      BP 103/64  - Pulse 89  - Ht 172.7 cm (5' 7.99)  - Wt 56.2 kg (124 lb)  -  LMP  (LMP Unknown)  - BMI 18.86 kg/m??   Wt Readings from Last 3 Encounters:   12/22/22 56.2 kg (124 lb)   10/22/22 59.9 kg (132 lb)   10/21/22 60.1 kg (132 lb 6.4 oz)      BMI Readings from Last 3 Encounters:   12/22/22 18.86 kg/m??   10/22/22 20.07 kg/m??   10/21/22 20.13 kg/m??     GEN: appears well, in NAD  HEENT: sclerae anicteric  NECK:  no visible neck mass or deformity  CHEST: normal breathing chest movements  NEURO: Aox3, following commands. In wheelchair  PSYCH: normal affect.  SKIN: no visible rash    Lab Review: most recent Calcium, Cr, GFR, Alk Phos, Phosphate, PTH and vitamin levels reviewed   Component      Latest Ref Rng & Units 09/24/2020 03/25/2021 11/10/2021   TSH      0.550 - 4.780 uIU/mL  1.434    Vitamin D Total (25OH)      20.0 - 80.0 ng/mL 29.3  25.1   Free T4      0.89 - 1.76 ng/dL        Component      Latest Ref Rng & Units 08/04/2021   Sodium      135 - 145 mmol/L 140   Potassium      3.4 - 4.8 mmol/L 4.1   Chloride      98 - 107 mmol/L 106   CO2      20.0 - 31.0 mmol/L 29.5   Anion Gap      5 - 14 mmol/L 5   Bun      9 - 23 mg/dL 14   Creatinine      1.61 - 0.80 mg/dL 0.96   BUN/Creatinine Ratio       20   eGFR CKD-EPI (2021) Female      >=60 mL/min/1.47m2 >90   Glucose      70 - 99 mg/dL 96   Calcium      8.7 - 10.4 mg/dL 9.7   Albumin      3.4 - 5.0 g/dL 3.8   Total Protein      5.7 - 8.2 g/dL 7.5   Total Bilirubin      0.3 - 1.2 mg/dL 0.5 AST      <=04 U/L 18   ALT      10 - 49 U/L <7 (L)   Alkaline Phosphatase      46 - 116 U/L 93       Radiology: most recent DXA scan reviewed and compared to previous one (if available)     DATE: 09/09/2021 2:34 PM  ACCESSION: [id number]  DICTATED: 09/09/2021 2:37 PM  INTERPRETATION LOCATION: [facility]     CLINICAL INDICATION: 67 years old Female with bone scan for c/f osteoporosis  - S72.001D - Closed fracture of right hip with routine healing, subsequent encounter       COMPARISON: None.     TECHNIQUE: Bone mineral density was assessed using the Horizon A bone densitometer.  The results of the study are expressed in bone mineral density (BMD) and interpreted using World Health Organization [employer]) criteria, per ISCD positions.     FINDINGS     Lumbar Spine       Excluded levels: none.      BMD:   0.595 (g/cm)        T score:  -4.1     Lumbar WHO classification: OSTEOPOROSIS.  Left Hip         Femoral Neck:           BMD:  0.463 (g/cm)           T score:  -3.5        Total Hip           BMD:  0.448 (g/cm)            T score:  -4.1      Hip WHO classification: OSTEOPOROSIS.

## 2022-12-24 MED ORDER — BENZONATATE 100 MG CAPSULE
ORAL_CAPSULE | Freq: Three times a day (TID) | ORAL | 1 refills | 10 days | Status: CP | PRN
Start: 2022-12-24 — End: 2023-12-24
  Filled 2022-12-29: qty 30, 10d supply, fill #0

## 2022-12-29 MED FILL — ULTICARE PEN NEEDLE 32 GAUGE X 5/32" (4 MM): 90 days supply | Qty: 100 | Fill #0

## 2022-12-29 MED FILL — TRAZODONE 50 MG TABLET: ORAL | 90 days supply | Qty: 45 | Fill #1

## 2022-12-30 ENCOUNTER — Institutional Professional Consult (permissible substitution): Admit: 2022-12-30 | Discharge: 2022-12-31 | Payer: MEDICAID | Attending: Clinical | Primary: Clinical

## 2022-12-30 NOTE — Unmapped (Signed)
INTERNAL MEDICINE COUNSELING SESSION NOTE    LENGTH OF SESSION:  Psychotherapy Session 30 minutes    TYPE OF PSYCHOTHERAPY: Behavioral, Supportive    REASON FOR TREATMENT: Problem Solving Treatment and Mind Body Stress Reduction skill training for caregiver stress, mixed anxiety and depression; link impact of effort to mood.    [id number]}      The patient reports they are physically located in [place] and is currently: at home. I conducted a phone visit.  I spent 30 minutes on the phone call with the patient on the date of service .           REVIEW OF FUNCTIONING SINCE LAST VISIT    MOOD (0-10) =    ok  Rating deferred    SATISFACTION WITH EFFORT IN CARING FOR MOOD (0-10) =    Rating deferred    PAIN:     4    At last visit was:  5    PHQ-9 SCORE:     Rating deferred    GAD-7 SCORE:     Rating deferred      CURRENT SUICIDAL/HOMICIDAL IDEATION:  None       PROBLEM LIST:    Patient Active Problem List   Diagnosis    Abnormal gait    Anxiety    Arthralgia of hip    Family history of genetic disorder    Family history of malignant neoplasm of breast    Family history of malignant neoplasm of cervix    Mixed anxiety depressive disorder    Scoliosis    Sjogren's syndrome with keratoconjunctivitis sicca (CMS-HCC)    Leukopenia    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  yes     Any changes in medication since last visit?  no     Any missed doses? no       Current Outpatient Medications on File Prior to Visit   Medication Sig Dispense Refill    abaloparatide 80 mcg (3,120 mcg/1.56 mL) PnIj Inject 80 mcg under the skin daily. 1.56 mL 6    albuterol 2.5 mg /3 mL (0.083 %) nebulizer solution Inhale 3 mL (2.5 mg total) by nebulization every six (6) hours as needed for wheezing or shortness of breath. 360 mL 11    albuterol HFA 90 mcg/actuation inhaler Inhale 1 puff every four (4) hours as needed for wheezing. 8.5 g 11    benzonatate (TESSALON PERLES) 100 MG capsule Take 1 capsule (100 mg total) by mouth Three (3) times a day as needed for cough. 30 capsule 1    budesonide-formoterol (SYMBICORT) 160-4.5 mcg/actuation inhaler Inhale 2 puffs Two (2) times a day. 10.2 g 11    citalopram (CELEXA) 40 MG tablet Take 1 tablet (40 mg total) by mouth daily. 90 tablet 2    diclofenac sodium (VOLTAREN) 1 % gel Apply 2 g topically four (4) times a day. 100 g 0    empty container (SHARPS CONTAINER) Misc Use as directed 1 each 2    EPINEPHrine (EPIPEN) 0.3 mg/0.3 mL injection Inject 0.3 mL (0.3 mg total) into the muscle once as needed for anaphylaxis for up to 1 dose. 2 each 1  hydroxychloroquine (PLAQUENIL) 200 mg tablet Take 1 tablet (200 mg total) by mouth daily. 90 tablet 3    hydrOXYzine (ATARAX) 25 MG tablet Take 1 tablet (25 mg total) by mouth two (2) times a day as needed. 60 tablet 2    ipratropium (ATROVENT) 0.02 % nebulizer solution Inhale the contents of 1 vial (500 mcg total) by nebulization Four (4) times a day. 62.5 mL 12    lidocaine (XYLOCAINE) 5 % ointment Apply topically daily. 141.76 g 0    montelukast (SINGULAIR) 10 mg tablet Take 1 tablet (10 mg total) by mouth daily. 90 tablet 3    omalizumab (XOLAIR) 150 mg/mL syringe Inject the contents of 1 syringe (150 mg total) under the skin every fourteen (14) days. 2 mL 11    omalizumab (XOLAIR) 75 mg/0.5 mL syringe Inject the contents of 1 syringe (75 mg total) under the skin every fourteen (14) days. 1 mL 11    omeprazole (PRILOSEC) 20 MG capsule Take 1 capsule (20 mg total) by mouth daily. 90 capsule 3    pen needle, diabetic 32 gauge x 5/32 (4 mm) Ndle Use as directed to inject Tymlos 100 each 3    pilocarpine (SALAGEN) 5 MG tablet Take 1 tablet (5 mg total) by mouth Three (3) times a day. 270 tablet 3    pregabalin (LYRICA) 25 MG capsule Take 1 capsule (25 mg total) by mouth Two (2) times a day. 180 capsule 1    tiotropium bromide (SPIRIVA RESPIMAT) 2.5 mcg/actuation inhalation mist Inhale 1 puff daily. 4 g 11    traZODone (DESYREL) 50 MG tablet Take 0.5 tablets (25 mg total) by mouth nightly. 90 tablet 2    triamcinolone (KENALOG) 0.1 % cream Apply topically Two (2) times a day. Apply to back for itching 45 g 0     Current Facility-Administered Medications on File Prior to Visit   Medication Dose Route Frequency Provider Last Rate Last Admin    sodium chloride 3 % nebulizer solution 4 mL  4 mL Nebulization Once Ellin Mayhew, MD               SLEEPING:     still up and down and I'm feeling rested after about 5 or 6 hours          APPETITE:    up and down 2 meals a day          SUBSTANCE USE:  deferred        SESSION FOCUS:  Addressed:  1. Grief    2. Mixed anxiety depressive disorder        Discussed efforts since last contact.  Going out more  Ate over at friend's home several times  Going out with friend  Will visit daughter [name] and will stay overnight while Heather's husband is out of town.  Time with her dogs          Today's Problem Solving addressed:    Reviewed patient's efforts and linked impact of effort to   1. Grief    2. Mixed anxiety depressive disorder        Addressed:   Grief, mixed anxiety and depression    Other relevant diagnosis(es):  poor sleep     Stressors:  Health issues  Limited social support. (Primarily supported by older daughter [name].)  Husband with advanced dementia died in his sleep in the morning of 10/26/21  Patient, and her husband, Doreatha Martin, were together 41 years.  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  Clinician reflected Ms. Matheny's sharing that her efforts in care for husband, now give her peace of mind.    Discussed issues presented and options for continued and/or improved self care management       Ms. Ruddy is doing better managing grief and grief intensity is reduced. She is ok with follow up phone contact end of March.        Additional:     Medication adherence and barriers to the treatment plan have been addressed. Opportunities to optimize healthy behaviors have been discussed. Patient / caregiver voiced understanding.                 ASSESSMENT      ENGAGEMENT: Patient presented a willingness to participate in treatment.  PARTICIPATION QUALITY:    Active  MOOD:  Ok   AFFECT:  FULL RANGE and CONGRUENT   MENTAL STATUS:     alert and oriented  MODES OF INTERVENTION used in this session:    Clarification, Exploration, Interim review, Problem Solving Treatment, and Support      TREATMENT PLAN    Short term goal focused counseling for identified treatment goals:   [x]  Reduce PHQ / GAD by 5 points  []  Get or keep PHQ / GAD under 10 points  [x]  Stress and self-care management;   []  Referral for outside treatment  []  Bridge to outside treatment  [x]  Other:   Coping skills, resource information, address anticipatory grief     Patient-stated health goal:   Coping skills to get thru the day when I???m totally frustrated by others and what they say and do. Coping skills to overcome the anxiousness.    Next visit will be   10 of up to 12 visits.     Return PHONE visit Wed 3/27 at 3pm

## 2023-01-01 LAB — VITAMIN D 25 HYDROXY: VITAMIN D, TOTAL (25OH): 20.5 ng/mL (ref 20.0–80.0)

## 2023-01-04 MED ORDER — CHOLECALCIFEROL (VITAMIN D3) 250 MCG (10,000 UNIT) CAPSULE
ORAL_CAPSULE | Freq: Every day | ORAL | 11 refills | 30 days | Status: CP
Start: 2023-01-04 — End: 2024-01-04
  Filled 2023-01-28: qty 30, 30d supply, fill #0

## 2023-01-15 MED ORDER — CITALOPRAM 40 MG TABLET
ORAL_TABLET | Freq: Every day | ORAL | 2 refills | 90 days | Status: CP
Start: 2023-01-15 — End: 2024-01-15
  Filled 2023-01-28: qty 90, 90d supply, fill #0

## 2023-01-15 NOTE — Unmapped (Signed)
[facility] Refill Coordination Note    Specialty Medication(s) to be Shipped:   CF/Pulmonary/Asthma: Xolair 150mg /ml  Xolair 75mg /0.74ml    Other medication(s) to be shipped:  Pregabalin 25, omeprazole 20, vitamin D3, pilocarpine 5, citalopram 40, hydroxychloroquine 200, montelukast 10     Christy Ibarra, DOB: [birth year]  Phone: [phone] (home)       All above HIPAA information was verified with patient.     Was a Nurse, learning disability used for this call? No    Completed refill call assessment today to schedule patient's medication shipment from the [facility] [phone]).  All relevant notes have been reviewed.     Specialty medication(s) and dose(s) confirmed: Regimen is correct and unchanged.   Changes to medications: Christy Ibarra reports no changes at this time.  Changes to insurance: No  New side effects reported not previously addressed with a pharmacist or physician: None reported  Questions for the pharmacist: No    Confirmed patient received a Conservation officer, historic buildings and a Surveyor, mining with first shipment. The patient will receive a drug information handout for each medication shipped and additional FDA Medication Guides as required.       DISEASE/MEDICATION-SPECIFIC INFORMATION        For patients on injectable medications: Patient currently has 1 doses left.  Next injection is scheduled for 01/20/23.    SPECIALTY MEDICATION ADHERENCE     Medication Adherence    Specialty Medication: XOLAIR 150 mg/mL syringe (omalizumab)  Patient is on additional specialty medications: Yes  Additional Specialty Medications: XOLAIR 75 mg/0.5 mL syringe (omalizumab)              Were doses missed due to medication being on hold? No    XOLAIR 150 mg/mL syringe (omalizumab)  : 0 days of medicine on hand   XOLAIR 75 mg/0.5 mL syringe (omalizumab)  : 0 days of medicine on hand       REFERRAL TO PHARMACIST     Referral to the pharmacist: Not needed      [facility]     Shipping address confirmed in Epic.     Patient was notified of new phone menu : No    Delivery Scheduled: Yes, Expected medication delivery date: 01/29/23.     Medication will be delivered via UPS to the prescription address in Epic WAM.    Christy Ibarra' Christy Ibarra

## 2023-01-15 NOTE — Unmapped (Signed)
-----   Message from Johnsie Cancel, MD sent at 01/15/2023  1:31 PM EST -----  Regarding: Follow up appointment needed  Can we schedule patient with me in March or early April? Due for follow up with me and also need to get EKG on her to monitor her QTc on her medications.     Thanks!  Katie

## 2023-01-25 ENCOUNTER — Ambulatory Visit: Admit: 2023-01-25 | Discharge: 2023-01-26 | Payer: MEDICAID

## 2023-01-25 DIAGNOSIS — M3501 Sicca syndrome with keratoconjunctivitis: Principal | ICD-10-CM

## 2023-01-25 DIAGNOSIS — J4489 Asthma-COPD overlap syndrome: Principal | ICD-10-CM

## 2023-01-25 DIAGNOSIS — J479 Bronchiectasis, uncomplicated: Principal | ICD-10-CM

## 2023-01-25 MED ORDER — RESP SYNCYTIAL VIRUS VAC, PREF A AND B (PF) 120 MCG/0.5 ML IM SOLUTION
Freq: Once | INTRAMUSCULAR | 0 refills | 1 days | Status: CP
Start: 2023-01-25 — End: 2023-01-25

## 2023-01-25 MED ORDER — NEILMED SINUS RINSE COMPLETE WITH PACKET
PACK | Freq: Two times a day (BID) | NASAL | 0 refills | 180 days | Status: CP
Start: 2023-01-25 — End: 2023-07-24

## 2023-01-25 MED ADMIN — sodium chloride 3 % NEBULIZER solution 4 mL: 4 mL | RESPIRATORY_TRACT | @ 18:00:00 | Stop: 2023-01-25

## 2023-01-25 MED FILL — ABRYSVO 120 MCG/0.5 ML INTRAMUSCULAR SOLUTION: INTRAMUSCULAR | 1 days supply | Qty: 0.5 | Fill #0
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
## 2023-01-25 NOTE — Unmapped (Signed)
I saw and evaluated the patient, participating in the key portions of the service.  I reviewed the resident’s note.  I agree with the resident’s findings and plan. Jaeven Wanzer N Cheralyn Oliver, MD

## 2023-01-25 NOTE — Unmapped (Signed)
Faxed pulm rehab order to [facility] [phone].  Fax confirmation received.

## 2023-01-25 NOTE — Unmapped (Signed)
Sputum induction performed with Sodium chloride 3%. Treatment was Well tolerated  , and patient was not able to produce a specimen for testing.

## 2023-01-25 NOTE — Unmapped (Addendum)
It was a pleasure seeing you in clinic today! You were seen in clinic today by Dr. Talmadge Coventry & Dr. Doralee Albino    Here's a summary of what we discussed during your visit:  Sputum tests today  RSV vaccine was sent to pharmacy downstairs  Referral placed to pulmonary rehab virtual option  Try sinus rinses for your sinus congestion  Keep up your airway clearance w/ the salty solution and your aerobika 3x per week  Keep up the good work with taking your medications, using your inhalers, exercising, and eating healthily      Follow up with me in 6 months!    Christy Ibarra E. Talmadge Coventry, MD  Pulmonary & Critical Care Fellow  [facility]  Phone: [phone]  Fax: [phone]    To contact your care team, you can either send a message via MyChart or contact the clinic at [phone].    How do I request medication refills?  Request a refill via [facility] (patient portal), call clinic at [phone] or have your pharmacy fax the request to [phone].    [facility]: [phone] *Pharmacy can mail medications to your home. You must call to request the medication be mailed.[facility]: [phone]  [facility]: [phone]    Here are some things you should know about contacting the [facility]:  Please be advised Epic now releases test results to MyChart as soon as they are available which means you will see your test results before I do.    The best way to reach your doctor for non-urgent matters is through MyChart. I can usually respond within two to three business day but I do not check messages after hours (evenings, weekends, and holidays) and often have other duties (inpatient hospital work, Producer, television/film/video activities, teaching) that make me unavailable.    - If you have sent a MyChart message to the clinic and have not received a response after three business days, please call our clinic at [phone] to speak to a nurse.     If you have an urgent issue that you feel needs a response the same day, you should also contact your primary care provider or be evaluated at an Urgent Care clinic.    For urgent lung issues after hours, you can call the hospital operator [phone]) and ask for the Pulmonary Fellow On Call. This doctor can provide some guidance and will send a message to your regular lung doctor the next morning.    If there is an emergency, call 911 or go to your closest emergency room.    I don't have a MyChart. Why should I get one?   - It's encrypted, so your information is secure  - It's a quick, easy way to contact the care team, manage appointments, see test results, and more!    How do I sign-up for MyChart?   - Download the MyChart app from the [employer] or [employer] and sign-up in the app  - Sign-up online at [email or web address]

## 2023-01-27 DIAGNOSIS — J4489 Asthma-COPD overlap syndrome: Principal | ICD-10-CM

## 2023-01-27 MED ORDER — MONTELUKAST 10 MG TABLET
ORAL_TABLET | Freq: Every day | ORAL | 3 refills | 90 days
Start: 2023-01-27 — End: 2024-01-27

## 2023-01-27 MED ORDER — XOLAIR 75 MG/0.5 ML SUBCUTANEOUS SYRINGE
SUBCUTANEOUS | 11 refills | 28 days
Start: 2023-01-27 — End: 2024-01-27

## 2023-01-27 MED ORDER — XOLAIR 150 MG/ML SUBCUTANEOUS SYRINGE
SUBCUTANEOUS | 11 refills | 28 days
Start: 2023-01-27 — End: 2024-01-27

## 2023-01-28 MED ORDER — XOLAIR 150 MG/ML SUBCUTANEOUS SYRINGE
SUBCUTANEOUS | 11 refills | 28 days | Status: CP
Start: 2023-01-28 — End: 2024-01-28
  Filled 2023-01-28: qty 2, 28d supply, fill #0

## 2023-01-28 MED ORDER — MONTELUKAST 10 MG TABLET
ORAL_TABLET | Freq: Every day | ORAL | 3 refills | 90 days | Status: CP
Start: 2023-01-28 — End: 2024-01-28
  Filled 2023-01-28: qty 90, 90d supply, fill #0

## 2023-01-28 MED ORDER — XOLAIR 75 MG/0.5 ML SUBCUTANEOUS SYRINGE
SUBCUTANEOUS | 11 refills | 28 days | Status: CP
Start: 2023-01-28 — End: 2024-01-28
  Filled 2023-01-28: qty 1, 28d supply, fill #0

## 2023-01-28 MED FILL — OMEPRAZOLE 20 MG CAPSULE,DELAYED RELEASE: ORAL | 90 days supply | Qty: 90 | Fill #3

## 2023-01-28 MED FILL — PREGABALIN 25 MG CAPSULE: ORAL | 90 days supply | Qty: 180 | Fill #1

## 2023-01-28 MED FILL — HYDROXYCHLOROQUINE 200 MG TABLET: ORAL | 90 days supply | Qty: 90 | Fill #1

## 2023-01-28 MED FILL — PILOCARPINE 5 MG TABLET: ORAL | 90 days supply | Qty: 270 | Fill #0

## 2023-02-16 NOTE — Unmapped (Signed)
EGD  Procedure #1     Procedure #2   [id number]  MRN   Rolm Baptise, Dellon to do Endoscopist     Is the patient's health insurance [employer], [facility]), or [employer]?     Urgent procedure     Are you pregnant?     Are you in the process of scheduling or awaiting results of a heart ultrasound, stress test, or catheterization to evaluate new or worsening chest pain, dizziness, or shortness of breath?     Do you take: Plavix (clopidogrel), Coumadin (warfarin), Lovenox (enoxaparin), Pradaxa (dabigatran), Effient (prasugrel), Xarelto (rivaroxaban), Eliquis (apixaban), Pletal (cilostazol), or Brilinta (ticagrelor)?          Which of the above medications are you taking?          What is the name of the medical practice that manages this medication?          What is the name of the medical provider who manages this medication?     Do you have hemophilia, von Willebrand disease, or low platelets?     Do you have a pacemaker or implanted cardiac defibrillator?     Has a [place] [facility] provider specified the location(s)?     Which location(s) did the [facility] provider specify?        [facility]        [place]        [facility]of[facility]        [facility]-Mod Sedation     Is procedure indication for variceal banding (this does NOT include variceal screening)?     Have you had a heart attack, stroke or heart stent placement within the past 6 months?     Month of event     Year of event (ONLY ENTER LAST 2 DIGITS)        5  Height (feet)   8  Height (inches)   135  Weight (pounds)   20.5  BMI          Did the ordering provider specify a bowel prep?          What bowel prep was specified?     Do you have chronic kidney disease?     Do you have chronic constipation or have you had poor quality bowel preps for past colonoscopies?     Do you have Crohn's disease or ulcerative colitis?     Have you had weight loss surgery?        TRUE  When you walk around your house or grocery store, do you have to stop and rest due to shortness of breath, chest pain, or light-headedness?     Do you ever use supplemental oxygen?     Have you been hospitalized for cirrhosis of the liver or heart failure in the last 12 months?     Have you been treated for mouth or throat cancer with radiation or surgery?     Have you been told that it is difficult for doctors to insert a breathing tube in you during anesthesia?     Have you had a heart or lung transplant?          Are you on dialysis?     Do you have cirrhosis of the liver?     Do you have myasthenia gravis?     Is the patient a prisoner?          Have you been diagnosed with sleep apnea  or do you wear a CPAP machine at night?     Are you younger than 30?     Have you previously received propofol sedation administered by an anesthesiologist for a GI procedure?     Do you drink an average of more than 3 drinks of alcohol per day?     Do you regularly take suboxone or any prescription medications for chronic pain?     Do you regularly take Ativan, Klonopin, Xanax, Valium, lorazepam, clonazepam, alprazolam, or diazepam?     Have you previously had difficulty with sedation during a GI procedure?     Have you been diagnosed with PTSD?     Are you allergic to fentanyl or midazolam (Versed)?     Do you take medications for HIV?   ################# ## ###################################################################################################################   MRN:          [id number]   Anticoag Review:  No   Nurse Triage:  No   GI Clinic Consult:  No   Procedure(s):  EGD     0   [facility]                  Endoscopist:  Laren Boom, Basil Dess, Dellon to do   Urgent:            No   Prep:                                 ################# ## ###################################################################################################################

## 2023-02-17 ENCOUNTER — Institutional Professional Consult (permissible substitution): Admit: 2023-02-17 | Payer: MEDICAID | Attending: Clinical | Primary: Clinical

## 2023-02-17 NOTE — Unmapped (Signed)
INTERNAL MEDICINE COUNSELING SESSION NOTE    LENGTH OF SESSION:  Psychotherapy Session 35 minutes    TYPE OF PSYCHOTHERAPY: Behavioral, Supportive    REASON FOR TREATMENT: Problem Solving Treatment and Mind Body Stress Reduction skill training for caregiver stress, mixed anxiety and depression; link impact of effort to mood.       [id number]      The patient reports they are physically located in [place] and is currently: at home. I conducted a phone visit.  I spent 35 minutes on the phone call with the patient on the date of service .           REVIEW OF FUNCTIONING SINCE LAST VISIT    MOOD (0-10) =    ok and concerned about billing Rating deferred    SATISFACTION WITH EFFORT IN CARING FOR MOOD (0-10) =    Rating deferred    PAIN:     6 - normal aches and pains worse with the rain.    At last visit was:  4    PHQ-9 SCORE:     Rating deferred    GAD-7 SCORE:     Rating deferred      CURRENT SUICIDAL/HOMICIDAL IDEATION:  None       PROBLEM LIST:    Patient Active Problem List   Diagnosis    Abnormal gait    Anxiety    Arthralgia of hip    Family history of genetic disorder    Family history of malignant neoplasm of breast    Family history of malignant neoplasm of cervix    Mixed anxiety depressive disorder    Scoliosis    Sjogren's syndrome with keratoconjunctivitis sicca (CMS-HCC)    Leukopenia    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  yes     Any changes in medication since last visit?  no     Any missed doses? no - missed Vit D supplement initially after prescription. It was sent to main [facility] rather than local pharmacy      Current Outpatient Medications on File Prior to Visit   Medication Sig Dispense Refill    abaloparatide 80 mcg (3,120 mcg/1.56 mL) PnIj Inject 80 mcg under the skin daily. 1.56 mL 6    albuterol 2.5 mg /3 mL (0.083 %) nebulizer solution Inhale 3 mL (2.5 mg total) by nebulization every six (6) hours as needed for wheezing or shortness of breath. 360 mL 11    albuterol HFA 90 mcg/actuation inhaler Inhale 1 puff every four (4) hours as needed for wheezing. 8.5 g 11    benzonatate (TESSALON PERLES) 100 MG capsule Take 1 capsule (100 mg total) by mouth Three (3) times a day as needed for cough. 30 capsule 1    budesonide-formoterol (SYMBICORT) 160-4.5 mcg/actuation inhaler Inhale 2 puffs Two (2) times a day. 10.2 g 11    cholecalciferol, vitamin D3-250 mcg, 10,000 unit,, 250 mcg (10,000 unit) capsule Take 1 capsule (250 mcg total) by mouth daily. 30 capsule 11    citalopram (CELEXA) 40 MG tablet Take 1 tablet (40 mg total) by mouth daily. 90 tablet 2    diclofenac sodium (VOLTAREN) 1 %  gel Apply 2 g topically four (4) times a day. 100 g 0    empty container (SHARPS CONTAINER) Misc Use as directed 1 each 2    EPINEPHrine (EPIPEN) 0.3 mg/0.3 mL injection Inject 0.3 mL (0.3 mg total) into the muscle once as needed for anaphylaxis for up to 1 dose. 2 each 1    hydroxychloroquine (PLAQUENIL) 200 mg tablet Take 1 tablet (200 mg total) by mouth daily. 90 tablet 3    hydrOXYzine (ATARAX) 25 MG tablet Take 1 tablet (25 mg total) by mouth two (2) times a day as needed. 60 tablet 2    ipratropium (ATROVENT) 0.02 % nebulizer solution Inhale the contents of 1 vial (500 mcg total) by nebulization Four (4) times a day. 62.5 mL 12    lidocaine (XYLOCAINE) 5 % ointment Apply topically daily. 141.76 g 0    montelukast (SINGULAIR) 10 mg tablet Take 1 tablet (10 mg total) by mouth daily. 90 tablet 3    omalizumab (XOLAIR) 150 mg/mL syringe Inject the contents of 1 syringe (150 mg total) under the skin every fourteen (14) days. 2 mL 11    omalizumab (XOLAIR) 75 mg/0.5 mL syringe Inject the contents of 1 syringe (75 mg total) under the skin every fourteen (14) days. 1 mL 11    omeprazole (PRILOSEC) 20 MG capsule Take 1 capsule (20 mg total) by mouth daily. 90 capsule 3    pen needle, diabetic 32 gauge x 5/32 (4 mm) Ndle Use as directed to inject Tymlos 100 each 3    pilocarpine (SALAGEN) 5 MG tablet Take 1 tablet (5 mg total) by mouth Three (3) times a day. 270 tablet 3    pregabalin (LYRICA) 25 MG capsule Take 1 capsule (25 mg total) by mouth Two (2) times a day. 180 capsule 1    [EXPIRED] RSV vaccine (ABRYSVO) 120 mcg/0.5 mL intramuscular injection Inject 0.5 mL into the muscle once for 1 dose. 0.5 mL 0    sod chlor-bicarb-squeez bottle (NEILMED SINUS RINSE COMPLETE) nasal packet 1 packet into each nostril two (2) times a day. 350 packet 0    tiotropium bromide (SPIRIVA RESPIMAT) 2.5 mcg/actuation inhalation mist Inhale 1 puff daily. 4 g 11    traZODone (DESYREL) 50 MG tablet Take 0.5 tablets (25 mg total) by mouth nightly. 90 tablet 2    triamcinolone (KENALOG) 0.1 % cream Apply topically Two (2) times a day. Apply to back for itching 45 g 0     Current Facility-Administered Medications on File Prior to Visit   Medication Dose Route Frequency Provider Last Rate Last Admin    sodium chloride 3 % nebulizer solution 4 mL  4 mL Nebulization Once Ellin Mayhew, MD               SLEEPING:    Getting from 4 to 6 hours sleep.  4 hours of sleep about 3 nights a week          APPETITE:    picking less energy to [employer] on Wheels and snacking          SUBSTANCE USE:  no        SESSION FOCUS:  Addressed:  1. Grief    2. Mixed anxiety depressive disorder        Discussed efforts since last contact.  Crafting for several days - ok but also got bored  Some reading  Play games  Coloring   Some time out with friend - mixed  experience                Today's Problem Solving addressed:    Reviewed patient's efforts and linked impact of effort to   1. Grief    2. Mixed anxiety depressive disorder        Addressed:    Grief, mixed anxiety and depression Other relevant diagnosis(es):  poor sleep     Stressors:  Health issues  Limited social support. (Primarily supported by older daughter [name].)  Husband with advanced dementia died in his sleep in the morning of 10/26/21  Patient, and her husband, Doreatha Martin, were together 41 years.  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 Bellefeuille is concerned about [employer].  Discussed her concerns.  She's had two recent bills over $200  She was concerned charge was related to counseling.  Discussed likely lower charge for phone visits.  She's used to getting hardcopy billing.  Clinician recommended she talk with Financial Navigator, send message by MyChart or see FN at clinic.      Challenges with loneliness, lack of supportive connections other than her oldest daughter, [name] since passing of her husband.  Frustration with other family and friends being focused on themselves.  Discussed Easter and preference for and how to spend the day.  Ms. Fuemmeler feels her daughter [name] and her granddaughter would welcome her to go over/spend the day with them if she asked.      Frustration and upset with younger daughter Tresa Endo.  Tresa Endo has relationship issues, and financial issues.  She doesn't provide support to patient.  Kelly didn't make use of option for 13 months of free counseling through hospice.        Discussed issues presented and options for continued and/or improved self care management   Plan to reach out to older daughter [name] and her granddaughter would welcome her to go over/spend the day with them      Reviewed tx and progress to date and relevance of efforts.  Clinician let Ms. Landry know there are 2 remaining sessions.  She says she's doing ok some days and some days feels stuck.  Clinician raised benefit of continuity supportive counseling and offered two options. See below.  Scheduled next visit and if she's interest in continuity support, recommended she call resources provided to initiate services.        Additional:     Medication adherence and barriers to the treatment plan have been addressed. Opportunities to optimize healthy behaviors have been discussed. Patient / caregiver voiced understanding.                 ASSESSMENT      ENGAGEMENT: Patient presented a willingness to participate in treatment.  PARTICIPATION QUALITY:    Active  MOOD:  ok and concerned about billing      AFFECT:  CONGRUENT   MENTAL STATUS:     alert, oriented, and frustrated  MODES OF INTERVENTION used in this session:    Clarification, Exploration, Interim review, Problem Solving Treatment, Support, and Treatment review      TREATMENT PLAN      For interest in continuity counseling call to initiate services:    Medicaid card - look on back of card for provider or behavioral health    Or [facility]: [phone]        Short term goal focused counseling for identified treatment  goals:   [x]  Reduce PHQ / GAD by 5 points  []  Get or keep PHQ / GAD under 10 points  [x]  Stress and self-care management;   []  Referral for outside treatment  []  Bridge to outside treatment  [x]  Other:   Coping skills, resource information, address anticipatory grief     Patient-stated health goal:   Coping skills to get thru the day when I???m totally frustrated by others and what they say and do. Coping skills to overcome the anxiousness.    Next visit will be    11  of up to 12 visits.     Return PHONE visit Tue 4/30 at 2pm

## 2023-02-17 NOTE — Unmapped (Signed)
[facility] Refill Coordination Note    Specialty Medication(s) to be Shipped:   CF/Pulmonary/Asthma: Xolair 150mg /ml  Xolair 75mg /0.66ml    Other medication(s) to be shipped:  Symbicort 160-4.70mcg, Spiriva Respimat 2.30mcg, cholecalciferol vitamin D3     MALAIKAH CHROBAK, DOB: 1956-06-13  Phone: [phone] (home)       All above HIPAA information was verified with patient.     Was a Nurse, learning disability used for this call? No    Completed refill call assessment today to schedule patient's medication shipment from the [facility] [phone]).  All relevant notes have been reviewed.     Specialty medication(s) and dose(s) confirmed: Regimen is correct and unchanged.   Changes to medications: Anchal reports no changes at this time.  Changes to insurance: No  New side effects reported not previously addressed with a pharmacist or physician: None reported  Questions for the pharmacist: No    Confirmed patient received a Conservation officer, historic buildings and a Surveyor, mining with first shipment. The patient will receive a drug information handout for each medication shipped and additional FDA Medication Guides as required.       DISEASE/MEDICATION-SPECIFIC INFORMATION        For patients on injectable medications: Patient currently has 1 doses left.  Next injection is scheduled for 02/25/23.    SPECIALTY MEDICATION ADHERENCE     Medication Adherence    Specialty Medication: XOLAIR 150 mg/mL syringe (omalizumab)  Patient is on additional specialty medications: Yes  Additional Specialty Medications: XOLAIR 75 mg/0.5 mL syringe (omalizumab)              Were doses missed due to medication being on hold? No    XOLAIR 150 mg/mL syringe (omalizumab)  : 0 days of medicine on hand   XOLAIR 75 mg/0.5 mL syringe (omalizumab)  : 0 days of medicine on hand       REFERRAL TO PHARMACIST     Referral to the pharmacist: Not needed      [facility]     Shipping address confirmed in Epic.     Patient was notified of new phone menu : No    Delivery Scheduled: Yes, Expected medication delivery date: 03/05/23.     Medication will be delivered via UPS to the prescription address in Epic WAM.    R[facility] Specialty Technician

## 2023-02-24 ENCOUNTER — Ambulatory Visit: Admit: 2023-02-24 | Discharge: 2023-02-25 | Payer: MEDICAID | Attending: Internal Medicine | Primary: Internal Medicine

## 2023-02-24 DIAGNOSIS — J4489 Asthma-COPD overlap syndrome (CMS-HCC): Principal | ICD-10-CM

## 2023-02-24 DIAGNOSIS — Z5181 Encounter for therapeutic drug level monitoring: Principal | ICD-10-CM

## 2023-02-24 DIAGNOSIS — M8000XD Age-related osteoporosis with current pathological fracture, unspecified site, subsequent encounter for fracture with routine healing: Principal | ICD-10-CM

## 2023-02-24 DIAGNOSIS — J479 Bronchiectasis, uncomplicated: Principal | ICD-10-CM

## 2023-02-24 DIAGNOSIS — Z13220 Encounter for screening for lipoid disorders: Principal | ICD-10-CM

## 2023-02-24 LAB — HDL CHOLESTEROL: HDL CHOLESTEROL: 48 mg/dL (ref 40–60)

## 2023-02-24 LAB — CHOLESTEROL, TOTAL: CHOLESTEROL: 209 mg/dL — ABNORMAL HIGH (ref <=200)

## 2023-02-24 LAB — LDL CHOLESTEROL, DIRECT: LDL CHOLESTEROL DIRECT: 158 mg/dL

## 2023-02-24 NOTE — Unmapped (Signed)
Talk to Pulmonology about if you need Ct Chest (I have down that since you quit in 2009 you are done)

## 2023-02-24 NOTE — Unmapped (Signed)
Internal Medicine Clinic Visit    Reason for Visit: Follow up     A/P:    1. Chronic midline low back pain with bilateral sciatica  Ongoing for years. H/o hip fracture in 2022. She is going to PT, and has only had the entry visit. She has future visits scheduled.  - Naproxen 250 mg BID  - Consider switching Celexa to SNRI in future (of note, did not tolerate Cymbalta in the past)    2. Herpes Zoster - Subacute Herpetic Neuralgia  Seen in May 2023 and diagnosed with herpes zoster to left posterior thigh. Lesions healed but still having numbness to the area. Improving with Gabapentin 100mg  TID.   - 100mg , 100mg , 200mg . Increase night dose due to worse symptoms at night.     2. Chronic pain of both knees  Not bothersome to her at the moment, states she thinks the shingles exacerbated it.   Knees are okay, not using voltaren gel right now    3. Bilateral Hand Weakness  MRI cervical spine in 2023 with disc bulge at C3-C4, C4-C5, C5-C6, and C6-C7. This may be due to radiculopathy. She reports weakness in both hands that presents mainly as difficulty opening jars. Doing exercises at home    4. Esophageal dysphagia  Referred to GI, appointment in September 2023. EGD without explanation for dysphagia.  - Continue Omeprazole 20mg     Health Maintenance  - FIT test given today  - Shingrix in the future - has recovered from acute herpes zoster     Cancer screening:  Colon: Planning to send in FIT (counseled that if this is not done yearly, not effective; still declines colonoscopy)  Cervical: done on 11/2020, no abnormalities, will repeat in 2027   Breast: Done 09/2022  Lung: will follow-up at next visit (recent CT from Pulm but need to verify smoking history)     Immunizations:  - Flu shot: UTD  - Pneumonia: Pneumovax 23 given 05/2020  - Shingrix: Unable to afford it   - Tdap/Td: Due 2032  - COVID: UTD  - RSV: Done in March      Other:  - HIV screen: Non-reactive in 2022  - DEXA scan: will address at next visit  - HCV: Non-reactive in 2022  - Hemoglobin A1C: 5.4 in 2020  - Lipid panel: Last done 2022    The 10-year ASCVD risk score (Arnett DK, et al., 2019) is: 4%    Values used to calculate the score:      Age: 53 years      Sex: Female      Is Non-Hispanic African American: No      Diabetic: No      Tobacco smoker: No      Systolic Blood Pressure: 112 mmHg      Is BP treated: No      HDL Cholesterol: 60 mg/dL      Total Cholesterol: 230 mg/dL    Note: For patients with SBP <90 or >200, Total Cholesterol <130 or >320, HDL <20 or >100 which are outside of the allowable range, the calculator will use these upper or lower values to calculate the patient???s risk score.      No follow-ups on file.      I personally spent *** minutes face-to-face and non-face-to-face in the care of this patient, which includes all pre, intra, and post visit time on the date of service.  __________________________________________________________    HPI:    67 year old with a history  of Sjogren's syndrome, anxiety/depression/PTSD, leukopenia, gait instability, osteoporosis, bronchiectasis and asthma/COPD overlap who presents today for follow up.     No concerns today.     Planning to start pulmonary rehab. Waiting to see if Medicaid pays for it.     Still following with Dianne. Finds it helpful. 2 sessions left so provided information on [facility]. Planning to reach out to them.    Osteoporosis: Remains on Tymlos. They increased her vitamin D to 10,000 units daily.   Only taking trazoodone 25 nightly as needed. Finds current celexa dose helpful.     Lyrica helping nerve pain.   ____       Saw spine center 09/2023.   ***    _______________________________    Still having left posterior thigh numbness after herpes zoster infection in May 2023. Reports it is improving and the Gabapentin is helping.     Bronchiectasis: Seeing Pulmonology 7/26. Using Symbicort as prescribed, albuterol using 2x a week PRN symptoms. Started Xolair, first dose given in April 2023, no adverse effects, less nasal congestion, breathing better. Less wheezing.     Engaging in PT for her back and gait- just started and has not had a full appointment yet.     Had LP done with Neurology. Results normal.     Seeing [facility] 8/24. Walking normal for her. Not worsening. Uses walker to get around.     Seeing Endocrinology in October.           __________________________________________________________    Problem List:  Patient Active Problem List   Diagnosis    Abnormal gait    Anxiety    Arthralgia of hip    Family history of genetic disorder    Family history of malignant neoplasm of breast    Family history of malignant neoplasm of cervix    Mixed anxiety depressive disorder    Scoliosis    Sjogren's syndrome with keratoconjunctivitis sicca (CMS-HCC)    Leukopenia    Age-related nuclear cataract of both eyes    Poor sleep    Grief    Osteoporosis with current pathological fracture    Bronchiectasis without complication (CMS-HCC)    Asthma-COPD overlap syndrome (CMS-HCC)    Esophageal dysphagia       Medications:  Reviewed in EPIC  __________________________________________________________    Physical Exam:   Vital Signs:  Vitals:    02/24/23 1539   BP: 112/58   BP Site: L Arm   BP Position: Sitting   BP Cuff Size: Medium   Pulse: 78   Resp: 17   Temp: 36.6 ??C (97.8 ??F)   TempSrc: Temporal   SpO2: 98%   Weight: 54.4 kg (120 lb)   Height: 170.2 cm (5' 7)            Body mass index is 18.79 kg/m??.    Gen: Appears at baseline  Cardio: Regular rate and rhythm  Pulm: Clear to all bases, no increased work of breathing  Ext: No LE edema  Neuro: Alert and oriented to situation. kg/m??.    Gen: Appears at baseline  Cardio: Regular rate and rhythm  Pulm: CTAB  Ext: No LE edema  Neuro: Alert and oriented to situation.

## 2023-03-02 DIAGNOSIS — E785 Hyperlipidemia, unspecified: Principal | ICD-10-CM

## 2023-03-02 DIAGNOSIS — Z1211 Encounter for screening for malignant neoplasm of colon: Principal | ICD-10-CM

## 2023-03-02 MED ORDER — ROSUVASTATIN 10 MG TABLET
ORAL_TABLET | Freq: Every evening | ORAL | 3 refills | 90 days | Status: CP
Start: 2023-03-02 — End: 2024-03-01
  Filled 2023-03-04: qty 90, 90d supply, fill #0

## 2023-03-02 NOTE — Unmapped (Signed)
Called patient to discuss cholesterol. Discussed that although ASCVD risk is <5%, she has a persistently elevated LDL, including one LDL reading in the past that was >190.  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  Discussed that it would be very reasonable to start a statin and she was amenable to this.  Crestor 10 mg sent to her pharmacy.  Discussed side effects.

## 2023-03-03 ENCOUNTER — Ambulatory Visit: Admit: 2023-03-03 | Discharge: 2023-03-03 | Payer: MEDICAID

## 2023-03-04 MED FILL — SYMBICORT 160 MCG-4.5 MCG/ACTUATION HFA AEROSOL INHALER: RESPIRATORY_TRACT | 30 days supply | Qty: 10.2 | Fill #7

## 2023-03-04 MED FILL — SPIRIVA RESPIMAT 2.5 MCG/ACTUATION SOLUTION FOR INHALATION: RESPIRATORY_TRACT | 30 days supply | Qty: 4 | Fill #3

## 2023-03-04 MED FILL — XOLAIR 75 MG/0.5 ML SUBCUTANEOUS SYRINGE: SUBCUTANEOUS | 28 days supply | Qty: 1 | Fill #1

## 2023-03-04 MED FILL — XOLAIR 150 MG/ML SUBCUTANEOUS SYRINGE: SUBCUTANEOUS | 28 days supply | Qty: 2 | Fill #1

## 2023-03-04 MED FILL — CHOLECALCIFEROL (VITAMIN D3) 250 MCG (10,000 UNIT) CAPSULE: ORAL | 30 days supply | Qty: 30 | Fill #1

## 2023-03-23 ENCOUNTER — Institutional Professional Consult (permissible substitution): Admit: 2023-03-23 | Discharge: 2023-03-24 | Payer: MEDICAID | Attending: Clinical | Primary: Clinical

## 2023-03-23 DIAGNOSIS — F4321 Adjustment disorder with depressed mood: Principal | ICD-10-CM

## 2023-03-23 DIAGNOSIS — G478 Other sleep disorders: Principal | ICD-10-CM

## 2023-03-23 DIAGNOSIS — F418 Other specified anxiety disorders: Principal | ICD-10-CM

## 2023-03-23 NOTE — Unmapped (Signed)
INTERNAL MEDICINE COUNSELING SESSION NOTE    LENGTH OF SESSION:  Psychotherapy Session 30 minutes    TYPE OF PSYCHOTHERAPY: Behavioral, Supportive    REASON FOR TREATMENT: Problem Solving Treatment and Mind Body Stress Reduction skill training for caregiver stress, mixed anxiety and depression; link impact of effort to mood.       [id number]}      The patient reports they are physically located in [place] and is currently: at home. I conducted a phone visit.  I spent 30 minutes on the phone call with the patient on the date of service . Additional 10 mins pre and post work on notes.          REVIEW OF FUNCTIONING SINCE LAST VISIT    MOOD (0-10) =    Ok Rating deferred    SATISFACTION WITH EFFORT IN CARING FOR MOOD (0-10) =    Rating deferred    PAIN:     A little bit better 4    At last visit was:   6 - normal aches and pains worse with the rain     PHQ-9 SCORE:     Rating deferred    GAD-7 SCORE:     Rating deferred      CURRENT SUICIDAL/HOMICIDAL IDEATION:  None       PROBLEM LIST:    Patient Active Problem List   Diagnosis    Abnormal gait    Anxiety    Arthralgia of hip    Family history of genetic disorder    Family history of malignant neoplasm of breast    Family history of malignant neoplasm of cervix    Mixed anxiety depressive disorder    Scoliosis    Sjogren's syndrome with keratoconjunctivitis sicca (CMS-HCC)    Leukopenia    Age-related nuclear cataract of both eyes    Poor sleep pattern    Grief    Osteoporosis with current pathological fracture    Bronchiectasis without complication (CMS-HCC)    Asthma-COPD overlap syndrome (CMS-HCC)    Esophageal dysphagia         MEDICATION COMPLIANCE:     Taking all medications regularly?  yes     Any changes in medication since last visit?  yes - new cholesterol medication. She is unsure about medication name. Patient still needs to pick up medication.    Any missed doses? no       Current Outpatient Medications on File Prior to Visit   Medication Sig Dispense Refill    abaloparatide 80 mcg (3,120 mcg/1.56 mL) PnIj Inject 80 mcg under the skin daily. 1.56 mL 6    albuterol 2.5 mg /3 mL (0.083 %) nebulizer solution Inhale 3 mL (2.5 mg total) by nebulization every six (6) hours as needed for wheezing or shortness of breath. 360 mL 11    albuterol HFA 90 mcg/actuation inhaler Inhale 1 puff every four (4) hours as needed for wheezing. 8.5 g 11    benzonatate (TESSALON PERLES) 100 MG capsule Take 1 capsule (100 mg total) by mouth Three (3) times a day as needed for cough. 30 capsule 1    budesonide-formoterol (SYMBICORT) 160-4.5 mcg/actuation inhaler Inhale 2 puffs Two (2) times a day. 10.2 g 11    cholecalciferol, vitamin D3-250 mcg, 10,000 unit,, 250 mcg (10,000 unit) capsule Take 1 capsule (250 mcg total) by mouth daily. 30 capsule 11    citalopram (CELEXA) 40 MG tablet Take 1 tablet (40 mg total) by mouth daily. 90 tablet 2    [  EXPIRED] diclofenac sodium (VOLTAREN) 1 % gel Apply 2 g topically four (4) times a day. 100 g 0    empty container (SHARPS CONTAINER) Misc Use as directed 1 each 2    EPINEPHrine (EPIPEN) 0.3 mg/0.3 mL injection Inject 0.3 mL (0.3 mg total) into the muscle once as needed for anaphylaxis for up to 1 dose. 2 each 1    hydroxychloroquine (PLAQUENIL) 200 mg tablet Take 1 tablet (200 mg total) by mouth daily. 90 tablet 3    ipratropium (ATROVENT) 0.02 % nebulizer solution Inhale the contents of 1 vial (500 mcg total) by nebulization Four (4) times a day. 62.5 mL 12    [EXPIRED] lidocaine (XYLOCAINE) 5 % ointment Apply topically daily. 141.76 g 0    montelukast (SINGULAIR) 10 mg tablet Take 1 tablet (10 mg total) by mouth daily. 90 tablet 3    omalizumab (XOLAIR) 150 mg/mL syringe Inject the contents of 1 syringe (150 mg total) under the skin every fourteen (14) days. 2 mL 11    omalizumab (XOLAIR) 75 mg/0.5 mL syringe Inject the contents of 1 syringe (75 mg total) under the skin every fourteen (14) days. 1 mL 11    omeprazole (PRILOSEC) 20 MG capsule Take 1 capsule (20 mg total) by mouth daily. 90 capsule 3    pen needle, diabetic 32 gauge x 5/32 (4 mm) Ndle Use as directed to inject Tymlos 100 each 3    pilocarpine (SALAGEN) 5 MG tablet Take 1 tablet (5 mg total) by mouth Three (3) times a day. 270 tablet 3    pregabalin (LYRICA) 25 MG capsule Take 1 capsule (25 mg total) by mouth Two (2) times a day. 180 capsule 1    rosuvastatin (CRESTOR) 10 MG tablet Take 1 tablet (10 mg total) by mouth nightly. 90 tablet 3    sod chlor-bicarb-squeez bottle (NEILMED SINUS RINSE COMPLETE) nasal packet 1 packet into each nostril two (2) times a day. 350 packet 0    tiotropium bromide (SPIRIVA RESPIMAT) 2.5 mcg/actuation inhalation mist Inhale 1 puff daily. 4 g 11    traZODone (DESYREL) 50 MG tablet Take 0.5 tablets (25 mg total) by mouth nightly. 90 tablet 2    triamcinolone (KENALOG) 0.1 % cream Apply topically Two (2) times a day. Apply to back for itching 45 g 0     Current Facility-Administered Medications on File Prior to Visit   Medication Dose Route Frequency Provider Last Rate Last Admin    sodium chloride 3 % nebulizer solution 4 mL  4 mL Nebulization Once Ellin Mayhew, MD               SLEEPING:    kind of all over the place Had been getting 4 hours a night. Last night got 7 hours sleep.  Staying up past 2am, sometimes until 3 or 4am  Recognizing that going to sleep late is throwing off her concentration.  Last night she went to bed at 12:30 am  Used guided sleep meditation 1 night which she found helpful.        APPETITE:    good cooking meals. Having 3 meals a day. Still getting Meals on Wheels.   Looking into getting chia and flaxseeds for healthy way to help lower cholesterol        SUBSTANCE USE:  deferred        SESSION FOCUS:  Addressed:  1. Mixed anxiety depressive disorder    2. Poor sleep pattern    3. Grief  Discussed efforts since last contact.  Cooking  Doing crafts  Doing low impact exercises 2x a week and yoga 2x a week for the last month.  Exercise is giving her more energy to be out with and play with dogs.  Used guided sleep meditation 1 night which she found helpful.  Time playing with dogs        Today's Problem Solving addressed:    Reviewed patient's efforts and linked impact of effort to   1. Mixed anxiety depressive disorder    2. Poor sleep pattern    3. Grief        Addressed:   mixed anxiety and depression and poor sleep pattern, grief     Other relevant diagnosis(es):       Stressors:  Health issues  Limited social support. (Primarily supported by older daughter [name].)  Husband with advanced dementia died in his sleep in the morning of 10/26/21  Patient, and her husband, Doreatha Martin, were together 41 years.  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 Tackett has desire to get off medications  Clinician recommended continued healthy self-care management efforts, work toward improved sleep and then can ask to review medications with PCP to determine    Grief  I still think about Sam, but it's not a crisis anymore.      Reviewed tx and progress to date and relevance of efforts.  Next phone session will be last visit.  Medicaid was recently recertified.  Clinician reminded her:  For interest in continuity counseling call to initiate services:  Medicaid card - look on back of card for provider or behavioral health or [facility]: [phone]      Discussed issues presented and options for continued and/or improved self care management   Recommendation to keep a regular wake up time  Continue:  Cooking  Doing crafts  Doing low impact exercises 2x a week and yoga 2x a week for the last month.  Time playing with dogs    For interest in continuity counseling call to initiate services:  Medicaid card - look on back of card for provider or behavioral health or [facility]: [phone]  Gave patient [facility] # again verbally which she wrote down.        Additional:     Medication adherence and barriers to the treatment plan have been addressed. Opportunities to optimize healthy behaviors have been discussed. Patient / caregiver voiced understanding.             ASSESSMENT      ENGAGEMENT: Patient presented a willingness to participate in treatment.  PARTICIPATION QUALITY:    Active  MOOD:  OK     AFFECT:  CONGRUENT   MENTAL STATUS:     alert and oriented  MODES OF INTERVENTION used in this session:    Clarification, Exploration, Interim review, Problem Solving Treatment, Support, and Treatment review      TREATMENT PLAN    For interest in continuity counseling call to initiate services:  Medicaid card - look on back of card for provider or behavioral health or [facility]: [phone]      Short term goal focused counseling for identified treatment goals:   [x]  Reduce PHQ / GAD by 5 points  []  Get  or keep PHQ / GAD under 10 points  [x]  Stress and self-care management;   []  Referral for outside treatment  []  Bridge to outside treatment  [x]  Other:   Coping skills, resource information, address anticipatory grief     Patient-stated health goal:   Coping skills to get thru the day when I???m totally frustrated by others and what they say and do. Coping skills to overcome the anxiousness.    Next visit will be    12  of up to 12 visits.     Final PHONE visit Wed  5/29 at 3pm

## 2023-03-26 NOTE — Unmapped (Signed)
[facility] Clinical Assessment & Refill Coordination Note    Christy Ibarra, DOB: [birth year]  Phone: [phone] (home)     All above HIPAA information was verified with patient.     Was a Nurse, learning disability used for this call? No    Specialty Medication(s):   CF/Pulmonary/Asthma: Xolair 75mg  & 150mg      Current Outpatient Medications   Medication Sig Dispense Refill    abaloparatide 80 mcg (3,120 mcg/1.56 mL) PnIj Inject 80 mcg under the skin daily. 1.56 mL 6    albuterol 2.5 mg /3 mL (0.083 %) nebulizer solution Inhale 3 mL (2.5 mg total) by nebulization every six (6) hours as needed for wheezing or shortness of breath. 360 mL 11    albuterol HFA 90 mcg/actuation inhaler Inhale 1 puff every four (4) hours as needed for wheezing. 8.5 g 11    benzonatate (TESSALON PERLES) 100 MG capsule Take 1 capsule (100 mg total) by mouth Three (3) times a day as needed for cough. 30 capsule 1    budesonide-formoterol (SYMBICORT) 160-4.5 mcg/actuation inhaler Inhale 2 puffs Two (2) times a day. 10.2 g 11    cholecalciferol, vitamin D3-250 mcg, 10,000 unit,, 250 mcg (10,000 unit) capsule Take 1 capsule (250 mcg total) by mouth daily. 30 capsule 11    citalopram (CELEXA) 40 MG tablet Take 1 tablet (40 mg total) by mouth daily. 90 tablet 2    empty container (SHARPS CONTAINER) Misc Use as directed 1 each 2    EPINEPHrine (EPIPEN) 0.3 mg/0.3 mL injection Inject 0.3 mL (0.3 mg total) into the muscle once as needed for anaphylaxis for up to 1 dose. 2 each 1    hydroxychloroquine (PLAQUENIL) 200 mg tablet Take 1 tablet (200 mg total) by mouth daily. 90 tablet 3    ipratropium (ATROVENT) 0.02 % nebulizer solution Inhale the contents of 1 vial (500 mcg total) by nebulization Four (4) times a day. 62.5 mL 12    montelukast (SINGULAIR) 10 mg tablet Take 1 tablet (10 mg total) by mouth daily. 90 tablet 3    omalizumab (XOLAIR) 150 mg/mL syringe Inject the contents of 1 syringe (150 mg total) under the skin every fourteen (14) days. 2 mL 11    omalizumab (XOLAIR) 75 mg/0.5 mL syringe Inject the contents of 1 syringe (75 mg total) under the skin every fourteen (14) days. 1 mL 11    omeprazole (PRILOSEC) 20 MG capsule Take 1 capsule (20 mg total) by mouth daily. 90 capsule 3    pen needle, diabetic 32 gauge x 5/32 (4 mm) Ndle Use as directed to inject Tymlos 100 each 3    pilocarpine (SALAGEN) 5 MG tablet Take 1 tablet (5 mg total) by mouth Three (3) times a day. 270 tablet 3    pregabalin (LYRICA) 25 MG capsule Take 1 capsule (25 mg total) by mouth Two (2) times a day. 180 capsule 1    rosuvastatin (CRESTOR) 10 MG tablet Take 1 tablet (10 mg total) by mouth nightly. 90 tablet 3    sod chlor-bicarb-squeez bottle (NEILMED SINUS RINSE COMPLETE) nasal packet Use 1 packet into each nostril two (2) times a day. 350 packet 0    tiotropium bromide (SPIRIVA RESPIMAT) 2.5 mcg/actuation inhalation mist Inhale 1 puff daily. 4 g 11    traZODone (DESYREL) 50 MG tablet Take 0.5 tablets (25 mg total) by mouth nightly. 90 tablet 2    triamcinolone (KENALOG) 0.1 % cream Apply topically Two (2) times a  day. Apply to back for itching 45 g 0     Current Facility-Administered Medications   Medication Dose Route Frequency Provider Last Rate Last Admin    sodium chloride 3 % nebulizer solution 4 mL  4 mL Nebulization Once Ellin Mayhew, MD            Changes to medications: Andreea reports no changes at this time.    Allergies   Allergen Reactions    [facility] Dust Cough       Changes to allergies: No    SPECIALTY MEDICATION ADHERENCE     Xolair 75  mg/0.61mL : 10 days of medicine on hand   Xolair 150 mg: 10 days of medicine on hand     Medication Adherence    Patient reported X missed doses in the last month: 0  Specialty Medication: Xolair 225mg  Q14d  Patient is on additional specialty medications: No  Patient is on more than two specialty medications: No  Any gaps in refill history greater than 2 weeks in the last 3 months: no  Demonstrates understanding of importance of adherence: yes  Informant: patient          Specialty medication(s) dose(s) confirmed: Regimen is correct and unchanged.     Are there any concerns with adherence? No    Adherence counseling provided? Not needed    CLINICAL MANAGEMENT AND INTERVENTION      Clinical Benefit Assessment:    Do you feel the medicine is effective or helping your condition? Yes    Clinical Benefit counseling provided? Progress note from 3/4 shows evidence of clinical benefit    Adverse Effects Assessment:    Are you experiencing any side effects? No    Are you experiencing difficulty administering your medicine? No    Quality of Life Assessment:    Quality of Life    Rheumatology  Oncology  Dermatology  Cystic Fibrosis          How many days over the past month did your asthma  keep you from your normal activities? For example, brushing your teeth or getting up in the morning. Patient declined to answer    Have you discussed this with your provider? Not needed    Acute Infection Status:    Acute infections noted within Epic:  No active infections  Patient reported infection: None    Therapy Appropriateness:    Is therapy appropriate and patient progressing towards therapeutic goals? Yes, therapy is appropriate and should be continued    DISEASE/MEDICATION-SPECIFIC INFORMATION      For patients on injectable medications: Patient currently has 1 doses left.  Next injection is scheduled for 5/12.    Asthma and Allergy: Have you had an asthma exacerbation in the last 30 days? No  Have you needed to use your rescue inhaler more often than usual in the last 30 days? Yes, 1-2x/week with spring pollen season   Have you needed to take steroids for your asthma in the last 30 days? No    PATIENT SPECIFIC NEEDS     Does the patient have any physical, cognitive, or cultural barriers? No    Is the patient high risk? No    Did the patient require a clinical intervention? No    Does the patient require physician intervention or other additional services (i.e., nutrition, smoking cessation, social work)? No    SOCIAL DETERMINANTS OF HEALTH     At the [facility], we have learned that life circumstances -  like trouble affording food, housing, utilities, or transportation can affect the health of many of our patients.   That is why we wanted to ask: are you currently experiencing any life circumstances that are negatively impacting your health and/or quality of life? Patient declined to answer    Social Determinants of Health     Financial Resource Strain: Medium Risk (11/04/2022)    Overall Financial Resource Strain (CARDIA)     Difficulty of Paying Living Expenses: Somewhat hard   Internet Connectivity: Not on file   Food Insecurity: Food Insecurity Present (11/04/2022)    Hunger Vital Sign     Worried About Running Out of Food in the Last Year: Sometimes true     Ran Out of Food in the Last Year: Sometimes true   Tobacco Use: Medium Risk (02/24/2023)    Patient History     Smoking Tobacco Use: Former     Smokeless Tobacco Use: Never     Passive Exposure: Never   Housing/Utilities: Low Risk  (11/04/2022)    Housing/Utilities     Within the past 12 months, have you ever stayed: outside, in a car, in a tent, in an overnight shelter, or temporarily in someone else's home (i.e. couch-surfing)?: No     Are you worried about losing your housing?: No     Within the past 12 months, have you been unable to get utilities (heat, electricity) when it was really needed?: No   Alcohol Use: Not At Risk (01/15/2022)    Alcohol Use     How often do you have a drink containing alcohol?: Never     How many drinks containing alcohol do you have on a typical day when you are drinking?: 1 - 2     How often do you have 5 or more drinks on one occasion?: Never   Transportation Needs: No Transportation Needs (11/04/2022)    PRAPARE - Transportation     Lack of Transportation (Medical): No     Lack of Transportation (Non-Medical): No Substance Use: Low Risk  (01/15/2022)    Substance Use     Taken prescription drugs for non-medical reasons: Never     Taken illegal drugs: Never     Patient indicated they have taken drugs in the past year for non-medical reasons: Yes, [positive answer(s)]: Not on file   Health Literacy: Medium Risk (06/23/2021)    Health Literacy     : Sometimes   Physical Activity: Not on file   Interpersonal Safety: Not on file   Stress: Not on file   Intimate Partner Violence: Not At Risk (06/23/2021)    Humiliation, Afraid, Rape, and Kick questionnaire     Fear of Current or Ex-Partner: No     Emotionally Abused: No     Physically Abused: No     Sexually Abused: No   Depression: At risk (06/23/2021)    PHQ-2     PHQ-2 Score: 3   Social Connections: Not on file       Would you be willing to receive help with any of the needs that you have identified today? Not applicable       SHIPPING     Specialty Medication(s) to be Shipped:   CF/Pulmonary/Asthma: Xolair 75mg  & 150mg     Other medication(s) to be shipped:  cholecalciferol 10000 units, ipratropium 0.02% neb soln, Neilmed sinus rinse, Spiriva respimat 2.5 mcg/act, and symbicort 160-4.11mcg/act     Changes to insurance: No    Delivery Scheduled: Yes,  Expected medication delivery date: 03/29/23.     Medication will be delivered via Same Day Courier to the confirmed prescription address in [facility].    The patient will receive a drug information handout for each medication shipped and additional FDA Medication Guides as required.  Verified that patient has previously received a Conservation officer, historic buildings and a Surveyor, mining.    The patient or caregiver noted above participated in the development of this care plan and knows that they can request review of or adjustments to the care plan at any time.      All of the patient's questions and concerns have been addressed.    Oliva Bustard, PharmD   [facility] Specialty Pharmacist

## 2023-03-26 NOTE — Unmapped (Signed)
[facility]   Patient Onboarding/Medication Counseling    Christy Ibarra is a 67 y.o. female with osteoporosis who I am counseling today on continuation of therapy.  I am speaking to the patient.    Was a Nurse, learning disability used for this call? No    Verified patient's date of birth / HIPAA.    Specialty medication(s) to be sent: General Specialty: Tymlos      Non-specialty medications/supplies to be sent: *see clinical note*      Medications not needed at this time: pen needles       The patient declined counseling on medication administration, missed dose instructions, goals of therapy, side effects and monitoring parameters, warnings and precautions, drug/food interactions, and storage, handling precautions, and disposal because they have taken the medication previously. The information in the declined sections below are for informational purposes only and was not discussed with patient.       Tymlos (Abaloparatide)    Medication & Administration     Dosage: Inject 80 mcg under the skin daily.    **Requires pen needles: Pen needles not provided with Tymlos pen. Use 31 gauge; 5-8 mm pen needles with Tymlos pen.    Administration:   Check Tymlos pen for expiration date and damages  Wash hands soap and warm water  Pull pen cap off Tymlos and check cartridge - liquid should be clear, colorless, and free of particles  Pull of protective paper from the outer needle cap of your pen needle.  Keep the needle straight and screw it onto the pen until fixed; a secure fit is ensured even if there is no noticeable stop. Do not over-tighten  Pull off the OUTER pen needle cap from the pen needle and keep it to use after your injection  Carefully pull off the INNER pen needle cap and dispose of it  Prime the pen for the day 1 first use only:  Turn the dose knob on your TYMLOS pen away from you (clockwise) until it stops. You will see ??? ? 80 ??? lined up in the dose display window  Hold the TYMLOS pen with the pen needle pointing up. Tap lightly with your finger on the cartridge holder to move any air bubbles in the cartridge to the top of the cartridge.  Press the green injection button until it will not go any further (You will see liquid come out of the needle tip and you will see ?0??? lined up in the dose display window) - if you do not see liquid come out then repeat steps a-c.  If still no liquid call pharmacist or use new pen.  Set the dose by turning the dose knob on your pen away from you (clockwise) until the dose knob stops and ????80??? is lined up in the dose display window (If not able to set dose to 80 then either not enough medication in pen or if new pen then use another pen)  Injections should be given in the lower stomach area (abdomen). Avoid the 2-inch area around your belly button (navel).  For each injection, change (rotate) your injection site around your abdomen.  Do not inject into areas where the skin is tender, bruised, red, scaly, or hard. Avoid areas with scars or stretch marks  Wipe the injection site with an alcohol swab and allow it to dry. Do not touch, fan, or blow on the injection site after you have cleaned it.  Hold the pen so you can see the  dose display window during the injection and insert the pen needle straight into your skin.  Press the green injection button until it cannot go any further and ????0??? is in the dose display window.  Do not move the TYMLOS pen after inserting the needle.  Continue to press the green injection button while counting to 10. Counting to 10 will allow the full dose of TYMLOS to be given  After counting to 10, release your finger from the green injection button and slowly remove the pen from the injection site by pulling the pen needle straight out.  Carefully place the outer needle cap back on the pen needle. Press on the outer needle cap until it snaps into place and is secure.  To prevent needle stick injury: Unscrew the capped needle (like unscrewing a cap from a bottle). To unscrew the capped needle you may need to turn it 8 or more turns and then gently pull until the capped needle comes off.   Do not push on the outer needle cap while unscrewing the needle. You should see a gap widening between the outer needle cap and the pen as you unscrew the needle.  Firmly replace the pen cap. Keep the pen cap on your TYMLOS pen between injections  Press a cotton ball or gauze pad over the injection site and hold for 10 seconds. Do not rub the injection site. You may have slight bleeding. This is normal. You may cover the injection site with a small adhesive bandage, if needed  Throw away your TYMLOS pen, in a sharps container, 30 days after first opening it even if it still contains unused medicine. Put your used pen needles in a sharps disposal container right away after use.    Adherence/Missed dose instructions:  Take a missed dose as soon as you think about it on the same day you missed the dose.  If you do not think about the missed dose until the next day, skip the missed dose and go back to your normal time.  Do not take more than 1 dose of this drug in the same day    Goals of Therapy     Osteoporosis: Treatment of postmenopausal females with osteoporosis at high risk for fracture May also be used in patients who have failed or are intolerant to other available osteoporosis therapy    Side Effects & Monitoring Parameters     Common side effects:  Injection site reaction: redness or irritation, swelling or pain  Feeling dizzy, tired, or weak  Headache  Upset stomach or stomach pain    The following side effects should be reported to the provider:  Allergic reaction  Signs of high calcium levels, like weakness, confusion, feeling tired, vomiting, constipation, or bone pain  Very bad dizziness or passing out  Fast or abnormal heartbeat  Muscle weakness  Pain when passing urine  Back pain, belly pain, or blood in urine (may be sings of a kidney stone)    Monitoring Parameters:  Orthostatic hypotension  Serum calcium  Urinary calcium  Bone mineral density (BMD) should be evaluated at baseline and ~1 to 2 years following initiation of therapy     Contraindications, Warnings, & Precautions     Orthostatic hypotension: low blood pressure when you stand up from sitting down may occur within 4 hours of dosing.  Be careful when standing from sitting position  Osteosarcoma: [US Boxed Warning]: In animal studies, abaloparatide has been associated with an increase in osteosarcoma; risk  was dependent on both dose and duration. Avoid use in patients with an increased risk of osteosarcoma (including Paget disease, bone metastases or skeletal malignancies, hereditary disorders predisposing to osteosarcoma, unexplained elevation of alkaline phosphatase, prior external beam or implant radiation therapy involving the skeleton, or in patients with open epiphyses).    Drug/Food Interactions     Medication list reviewed in Epic. The patient was instructed to inform the care team before taking any new medications or supplements. No drug interactions identified.     Storage, Handling Precautions, & Disposal     Before first use, store refrigerated at 2??C to 8??C (36??F to 46??F)  After first use, store for up to 30 days at room temperature (20??C to 25??C or 68??F to 51??F)  Store with pen cap on but do not store with needle on.  Discard Tymlos after 30 days of first opening (write down date of first use on pen/box).      Current Medications (including OTC/herbals), Comorbidities and Allergies     Current Outpatient Medications   Medication Sig Dispense Refill    abaloparatide 80 mcg (3,120 mcg/1.56 mL) PnIj Inject 80 mcg under the skin daily. 1.56 mL 6    albuterol 2.5 mg /3 mL (0.083 %) nebulizer solution Inhale 3 mL (2.5 mg total) by nebulization every six (6) hours as needed for wheezing or shortness of breath. 360 mL 11    albuterol HFA 90 mcg/actuation inhaler Inhale 1 puff every four (4) hours as needed for wheezing. 8.5 g 11    benzonatate (TESSALON PERLES) 100 MG capsule Take 1 capsule (100 mg total) by mouth Three (3) times a day as needed for cough. 30 capsule 1    budesonide-formoterol (SYMBICORT) 160-4.5 mcg/actuation inhaler Inhale 2 puffs Two (2) times a day. 10.2 g 11    cholecalciferol, vitamin D3-250 mcg, 10,000 unit,, 250 mcg (10,000 unit) capsule Take 1 capsule (250 mcg total) by mouth daily. 30 capsule 11    citalopram (CELEXA) 40 MG tablet Take 1 tablet (40 mg total) by mouth daily. 90 tablet 2    empty container (SHARPS CONTAINER) Misc Use as directed 1 each 2    EPINEPHrine (EPIPEN) 0.3 mg/0.3 mL injection Inject 0.3 mL (0.3 mg total) into the muscle once as needed for anaphylaxis for up to 1 dose. 2 each 1    hydroxychloroquine (PLAQUENIL) 200 mg tablet Take 1 tablet (200 mg total) by mouth daily. 90 tablet 3    ipratropium (ATROVENT) 0.02 % nebulizer solution Inhale the contents of 1 vial (500 mcg total) by nebulization Four (4) times a day. 62.5 mL 12    montelukast (SINGULAIR) 10 mg tablet Take 1 tablet (10 mg total) by mouth daily. 90 tablet 3    omalizumab (XOLAIR) 150 mg/mL syringe Inject the contents of 1 syringe (150 mg total) under the skin every fourteen (14) days. 2 mL 11    omalizumab (XOLAIR) 75 mg/0.5 mL syringe Inject the contents of 1 syringe (75 mg total) under the skin every fourteen (14) days. 1 mL 11    omeprazole (PRILOSEC) 20 MG capsule Take 1 capsule (20 mg total) by mouth daily. 90 capsule 3    pen needle, diabetic 32 gauge x 5/32 (4 mm) Ndle Use as directed to inject Tymlos 100 each 3    pilocarpine (SALAGEN) 5 MG tablet Take 1 tablet (5 mg total) by mouth Three (3) times a day. 270 tablet 3    pregabalin (LYRICA) 25 MG capsule Take 1  capsule (25 mg total) by mouth Two (2) times a day. 180 capsule 1    rosuvastatin (CRESTOR) 10 MG tablet Take 1 tablet (10 mg total) by mouth nightly. 90 tablet 3    sod chlor-bicarb-squeez bottle (NEILMED SINUS RINSE COMPLETE) nasal packet 1 packet into each nostril two (2) times a day. 350 packet 0    tiotropium bromide (SPIRIVA RESPIMAT) 2.5 mcg/actuation inhalation mist Inhale 1 puff daily. 4 g 11    traZODone (DESYREL) 50 MG tablet Take 0.5 tablets (25 mg total) by mouth nightly. 90 tablet 2    triamcinolone (KENALOG) 0.1 % cream Apply topically Two (2) times a day. Apply to back for itching 45 g 0     Current Facility-Administered Medications   Medication Dose Route Frequency Provider Last Rate Last Admin    sodium chloride 3 % nebulizer solution 4 mL  4 mL Nebulization Once Ellin Mayhew, MD           Allergies   Allergen Reactions    [facility] Dust Cough       Patient Active Problem List   Diagnosis    Abnormal gait    Anxiety    Arthralgia of hip    Family history of genetic disorder    Family history of malignant neoplasm of breast    Family history of malignant neoplasm of cervix    Mixed anxiety depressive disorder    Scoliosis    Sjogren's syndrome with keratoconjunctivitis sicca (CMS-HCC)    Leukopenia    Age-related nuclear cataract of both eyes    Poor sleep pattern    Grief    Osteoporosis with current pathological fracture    Bronchiectasis without complication (CMS-HCC)    Asthma-COPD overlap syndrome (CMS-HCC)    Esophageal dysphagia       Reviewed and up to date in Epic.    Appropriateness of Therapy     Acute infections noted within Epic:  No active infections  Patient reported infection: None    Is medication and dose appropriate based on diagnosis and infection status? Yes    Prescription has been clinically reviewed: Yes      Baseline Quality of Life Assessment      How many days over the past month did your osteoporosis  keep you from your normal activities? For example, brushing your teeth or getting up in the morning. Patient declined to answer    Financial Information     Medication Assistance provided: Prior Authorization    Anticipated copay of $4 reviewed with patient. Verified delivery address.    Delivery Information     Scheduled delivery date: 03/29/23    Expected start date: Continuation of therapy - Christy Ibarra notes only having a few days of medicine on hand    Medication will be delivered via Same Day Courier to the prescription address in [place].  This shipment will not require a signature.      Explained the services we provide at [facility] and that each month we would call to set up refills.  Stressed importance of returning phone calls so that we could ensure they receive their medications in time each month.  Informed patient that we should be setting up refills 7-10 days prior to when they will run out of medication.  A pharmacist will reach out to perform a clinical assessment periodically.  Informed patient that a welcome packet, containing information about our pharmacy and other support services, a Notice  of Privacy Practices, and a drug information handout will be sent.      The patient or caregiver noted above participated in the development of this care plan and knows that they can request review of or adjustments to the care plan at any time.      Patient or caregiver verbalized understanding of the above information as well as how to contact the pharmacy at [phone] option 4 with any questions/concerns.  The pharmacy is open Monday through Friday 8:30am-4:30pm.  A pharmacist is available 24/7 via pager to answer any clinical questions they may have.    Patient Specific Needs     Does the patient have any physical, cognitive, or cultural barriers? No    Does the patient have adequate living arrangements? (i.e. the ability to store and take their medication appropriately) Yes    Did you identify any home environmental safety or security hazards? No    Patient prefers to have medications discussed with  Patient     Is the patient or caregiver able to read and understand education materials at a high school level or above? Yes    Patient's primary language is  English     Is the patient high risk? No    SOCIAL DETERMINANTS OF HEALTH     At the [facility], we have learned that life circumstances - like trouble affording food, housing, utilities, or transportation can affect the health of many of our patients.   That is why we wanted to ask: are you currently experiencing any life circumstances that are negatively impacting your health and/or quality of life? Patient declined to answer    Social Determinants of Health     Financial Resource Strain: Medium Risk (11/04/2022)    Overall Financial Resource Strain (CARDIA)     Difficulty of Paying Living Expenses: Somewhat hard   Internet Connectivity: Not on file   Food Insecurity: Food Insecurity Present (11/04/2022)    Hunger Vital Sign     Worried About Running Out of Food in the Last Year: Sometimes true     Ran Out of Food in the Last Year: Sometimes true   Tobacco Use: Medium Risk (02/24/2023)    Patient History     Smoking Tobacco Use: Former     Smokeless Tobacco Use: Never     Passive Exposure: Never   Housing/Utilities: Low Risk  (11/04/2022)    Housing/Utilities     Within the past 12 months, have you ever stayed: outside, in a car, in a tent, in an overnight shelter, or temporarily in someone else's home (i.e. couch-surfing)?: No     Are you worried about losing your housing?: No     Within the past 12 months, have you been unable to get utilities (heat, electricity) when it was really needed?: No   Alcohol Use: Not At Risk (01/15/2022)    Alcohol Use     How often do you have a drink containing alcohol?: Never     How many drinks containing alcohol do you have on a typical day when you are drinking?: 1 - 2     How often do you have 5 or more drinks on one occasion?: Never   Transportation Needs: No Transportation Needs (11/04/2022)    PRAPARE - Transportation     Lack of Transportation (Medical): No     Lack of Transportation (Non-Medical): No   Substance Use: Low Risk  (01/15/2022)    Substance Use Taken prescription drugs for  non-medical reasons: Never     Taken illegal drugs: Never     Patient indicated they have taken drugs in the past year for non-medical reasons: Yes, [positive answer(s)]: Not on file   Health Literacy: Medium Risk (06/23/2021)    Health Literacy     : Sometimes   Physical Activity: Not on file   Interpersonal Safety: Not on file   Stress: Not on file   Intimate Partner Violence: Not At Risk (06/23/2021)    Humiliation, Afraid, Rape, and Kick questionnaire     Fear of Current or Ex-Partner: No     Emotionally Abused: No     Physically Abused: No     Sexually Abused: No   Depression: At risk (06/23/2021)    PHQ-2     PHQ-2 Score: 3   Social Connections: Not on file       Would you be willing to receive help with any of the needs that you have identified today? Not applicable       Oliva Bustard, PharmD  [facility] Specialty Pharmacist

## 2023-03-29 MED FILL — XOLAIR 150 MG/ML SUBCUTANEOUS SYRINGE: SUBCUTANEOUS | 28 days supply | Qty: 2 | Fill #2

## 2023-03-29 MED FILL — NEILMED SINUS RINSE COMPLETE WITH PACKET: NASAL | 25 days supply | Qty: 50 | Fill #0

## 2023-03-29 MED FILL — IPRATROPIUM BROMIDE 0.02 % SOLUTION FOR INHALATION: RESPIRATORY_TRACT | 7 days supply | Qty: 62.5 | Fill #1

## 2023-03-29 MED FILL — SYMBICORT 160 MCG-4.5 MCG/ACTUATION HFA AEROSOL INHALER: RESPIRATORY_TRACT | 30 days supply | Qty: 10.2 | Fill #8

## 2023-03-29 MED FILL — CHOLECALCIFEROL (VITAMIN D3) 250 MCG (10,000 UNIT) CAPSULE: ORAL | 30 days supply | Qty: 30 | Fill #2

## 2023-03-29 MED FILL — SPIRIVA RESPIMAT 2.5 MCG/ACTUATION SOLUTION FOR INHALATION: RESPIRATORY_TRACT | 30 days supply | Qty: 4 | Fill #4

## 2023-03-29 MED FILL — TYMLOS 80 MCG/DOSE (3,120 MCG/1.56 ML) SUBCUTANEOUS PEN INJECTOR: SUBCUTANEOUS | 30 days supply | Qty: 1.56 | Fill #0

## 2023-03-29 MED FILL — XOLAIR 75 MG/0.5 ML SUBCUTANEOUS SYRINGE: SUBCUTANEOUS | 28 days supply | Qty: 1 | Fill #2

## 2023-04-07 ENCOUNTER — Encounter: Admit: 2023-04-07 | Discharge: 2023-04-11 | Payer: MEDICAID | Attending: Anesthesiology | Primary: Anesthesiology

## 2023-04-07 ENCOUNTER — Ambulatory Visit: Admit: 2023-04-07 | Discharge: 2023-04-11 | Payer: MEDICAID

## 2023-04-07 MED ADMIN — Propofol (DIPRIVAN) injection: INTRAVENOUS | @ 19:00:00 | Stop: 2023-04-07

## 2023-04-07 MED ADMIN — lidocaine (XYLOCAINE) 20 mg/mL (2 %) injection: INTRAVENOUS | @ 19:00:00 | Stop: 2023-04-07

## 2023-04-07 MED ADMIN — lactated Ringers infusion: 10 mL/h | INTRAVENOUS | @ 19:00:00

## 2023-04-07 MED ADMIN — phenylephrine 0.8 mg/10 mL (80 mcg/mL) injection: INTRAVENOUS | @ 19:00:00 | Stop: 2023-04-07

## 2023-04-12 NOTE — Unmapped (Signed)
I spoke with the patient about her results, cardia IM but no Barrett's. She does not need surveillance (increased risk with cardia IM is minuscule, not treated or surveyed). She is much lower risk for esophageal adenocarcinoma for example than a man with perfectly normal findings in her esophagus and cardia. She has several endoscopic and histologic (the eosinophils) signs that suggest GERD. I would treat this as needed for her symptoms, but not worry about risk for malignancy in this case. It's never zero, but here it is way below thresholds to survey.

## 2023-04-21 ENCOUNTER — Institutional Professional Consult (permissible substitution): Admit: 2023-04-21 | Payer: MEDICAID | Attending: Clinical | Primary: Clinical

## 2023-04-21 NOTE — Unmapped (Signed)
INTERNAL MEDICINE COUNSELING SESSION NOTE    LENGTH OF SESSION:  Psychotherapy Session 45 minutes    TYPE OF PSYCHOTHERAPY: Behavioral, Supportive    REASON FOR TREATMENT: Problem Solving Treatment and Mind Body Stress Reduction skill training for mixed anxiety and depression; link impact of effort to mood.       [id number]}      REVIEW OF FUNCTIONING SINCE LAST VISIT    MOOD (0-10) =    Ok Rating deferred    SATISFACTION WITH EFFORT IN CARING FOR MOOD (0-10) =    Rating deferred    PAIN:     4    At last visit was:  4    PHQ-9 SCORE:     5 of 27    GAD-7 SCORE:     4 of 21      CURRENT SUICIDAL/HOMICIDAL IDEATION:  None       PROBLEM LIST:    Patient Active Problem List   Diagnosis    Abnormal gait    Anxiety    Arthralgia of hip    Family history of genetic disorder    Family history of malignant neoplasm of breast    Family history of malignant neoplasm of cervix    Scoliosis    Sjogren's syndrome with keratoconjunctivitis sicca (CMS-HCC)    Leukopenia    Age-related nuclear cataract of both eyes    Poor sleep pattern    Osteoporosis with current pathological fracture    Bronchiectasis without complication (CMS-HCC)    Asthma-COPD overlap syndrome (CMS-HCC)    Esophageal dysphagia         MEDICATION COMPLIANCE:     Taking all medications regularly?  yes     Any changes in medication since last visit?  no     Any missed doses? no       Current Outpatient Medications on File Prior to Visit   Medication Sig Dispense Refill    abaloparatide 80 mcg (3,120 mcg/1.56 mL) PnIj Inject 80 mcg under the skin daily. 1.56 mL 6    albuterol 2.5 mg /3 mL (0.083 %) nebulizer solution Inhale 3 mL (2.5 mg total) by nebulization every six (6) hours as needed for wheezing or shortness of breath. 360 mL 11    albuterol HFA 90 mcg/actuation inhaler Inhale 1 puff every four (4) hours as needed for wheezing. 8.5 g 11    budesonide-formoterol (SYMBICORT) 160-4.5 mcg/actuation inhaler Inhale 2 puffs Two (2) times a day. 10.2 g 11 cholecalciferol, vitamin D3-250 mcg, 10,000 unit,, 250 mcg (10,000 unit) capsule Take 1 capsule (250 mcg total) by mouth daily. 30 capsule 11    citalopram (CELEXA) 40 MG tablet Take 1 tablet (40 mg total) by mouth daily. 90 tablet 2    empty container (SHARPS CONTAINER) Misc Use as directed 1 each 2    EPINEPHrine (EPIPEN) 0.3 mg/0.3 mL injection Inject 0.3 mL (0.3 mg total) into the muscle once as needed for anaphylaxis for up to 1 dose. 2 each 1    hydroxychloroquine (PLAQUENIL) 200 mg tablet Take 1 tablet (200 mg total) by mouth daily. 90 tablet 3    ipratropium (ATROVENT) 0.02 % nebulizer solution Inhale the contents of 1 vial (500 mcg total) by nebulization Four (4) times a day. 62.5 mL 12    montelukast (SINGULAIR) 10 mg tablet Take 1 tablet (10 mg total) by mouth daily. 90 tablet 3    omalizumab (XOLAIR) 150 mg/mL syringe Inject the contents of 1 syringe (150 mg total)  under the skin every fourteen (14) days. 2 mL 11    omeprazole (PRILOSEC) 20 MG capsule Take 1 capsule (20 mg total) by mouth daily. 90 capsule 3    pen needle, diabetic 32 gauge x 5/32 (4 mm) Ndle Use as directed to inject Tymlos 100 each 3    pilocarpine (SALAGEN) 5 MG tablet Take 1 tablet (5 mg total) by mouth Three (3) times a day. 270 tablet 3    pregabalin (LYRICA) 25 MG capsule Take 1 capsule (25 mg total) by mouth Two (2) times a day. 180 capsule 1    rosuvastatin (CRESTOR) 10 MG tablet Take 1 tablet (10 mg total) by mouth nightly. 90 tablet 3    traZODone (DESYREL) 50 MG tablet Take 0.5 tablets (25 mg total) by mouth nightly. 90 tablet 2     Current Facility-Administered Medications on File Prior to Visit   Medication Dose Route Frequency Provider Last Rate Last Admin    sodium chloride 3 % nebulizer solution 4 mL  4 mL Nebulization Once Ellin Mayhew, MD               SLEEPING:    up and down again  More rumination at night            APPETITE:    good  She is eating less red meat.  She reports less joint pain with primarily gluten free diet.  Making effort to eat super foods  Cooking more. Making new dishes and trying new spices.    SUBSTANCE USE:  deferred        SESSION FOCUS:  Addressed:  1. Poor sleep pattern        Discussed efforts since last contact.  Based on her research she learned gluten free diet can be helpful for people with Sjogrens.  She is eating less red meat.  She reports less joint pain with primarily gluten free diet.  Making effort to eat super foods  Research indicated to her that diet can help reduce depression and anxiety.  More energy which she attributes to her health eating.  Went for a walk in nature and took pictures  Enjoys time with her dogs.  Journaling        Today's Problem Solving addressed:    Reviewed patient's efforts and linked impact of effort to   1. Poor sleep pattern          Addressed:    poor sleep pattern     Other relevant diagnosis(es):       Stressors at assessment  Health issues  Limited social support. (Primarily supported by older daughter Christy Ibarra.)  Husband with advanced dementia died in his sleep in the morning of 10/26/21  Patient, and her husband, Christy Ibarra, were together 41 years.  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  No longer adversely impacted by grief. Continues to miss husband. Spirituality is helpful for her along with her other self-care efforts.  Mixed anxiety and depression - resolved  I've learned through these sessions, it's ok to set limits with others.  I have tools.  I'm kind of loving myself a little bit more.  She reports not getting caught up in excess worry.  Making efforts to limit/prevent depression and anxiety.  I applauded patient's efforts and encouraged continued practice.  Christy Ibarra expressed appreciation for  support and all that she's learned.      Poor sleep pattern  CBT-i  Sleep has been up and down again  More rumination at night  Psychoeducation regarding healthy sleep hygiene and healthy sleep patterns  Discussed benefit of free app CBT-i to track and improve sleep.      Discussed issues presented and options for continued and/or improved self care management   Recommendation:  - Avoid electronics after going to bed  - Go to bed only when you're ready to sleep  - If you can't sleep after 15-20 minutes get out of bed and do a low light/low impact activity. Go back to bed when you're ready to sleep.  - Keep a regular wake up time        Patient is aware they can return for further services in the future if of benefit. If within 6 months / within same calendar year, patient is aware they can schedule a return visit. If beyond this timeframe patient is aware they'll need to talk with their doctor about a new referral.      Additional:     Medication adherence and barriers to the treatment plan have been addressed. Opportunities to optimize healthy behaviors have been discussed. Patient / caregiver voiced understanding.         ASSESSMENT    No longer adversely impacted by grief  Mixed anxiety and depression - resolved  Periodic anxiety can still arise. Diagnosis kept on Problem List.  Continue poor sleep pattern diagnosis.    ENGAGEMENT: Patient presented a willingness to participate in treatment.  PARTICIPATION QUALITY:    Active  MOOD:  OK     AFFECT:  CONGRUENT   MENTAL STATUS:     alert and oriented  MODES OF INTERVENTION used in this session:    Clarification, Cognitive Behavioral Treatment for Insomnia, Education, Exploration, Interim review, Problem Solving Treatment, Support, and Treatment review      TREATMENT PLAN    Treatment series complete.    Christy Ibarra is aware of continuity counseling resources.   Medicaid card - look on back of card for provider or behavioral health or [facility]: [phone]  She plans to try to contact [facility]    Patient is aware they can return for further services in the future if of benefit. If within 6 months / within same calendar year, patient is aware they can schedule a return visit. If beyond this timeframe patient is aware they'll need to talk with their doctor about a new referral.

## 2023-04-22 DIAGNOSIS — J449 Chronic obstructive pulmonary disease, unspecified: Principal | ICD-10-CM

## 2023-04-22 DIAGNOSIS — B0229 Other postherpetic nervous system involvement: Principal | ICD-10-CM

## 2023-04-22 DIAGNOSIS — R1319 Other dysphagia: Principal | ICD-10-CM

## 2023-04-22 MED ORDER — BUDESONIDE-FORMOTEROL HFA 160 MCG-4.5 MCG/ACTUATION AEROSOL INHALER
Freq: Two times a day (BID) | RESPIRATORY_TRACT | 11 refills | 31 days | Status: CP
Start: 2023-04-22 — End: 2024-04-21

## 2023-04-22 MED ORDER — TRAZODONE 50 MG TABLET
ORAL_TABLET | Freq: Every evening | ORAL | 2 refills | 180 days
Start: 2023-04-22 — End: 2023-05-22

## 2023-04-22 MED ORDER — PREGABALIN 25 MG CAPSULE
ORAL_CAPSULE | Freq: Two times a day (BID) | ORAL | 1 refills | 90 days
Start: 2023-04-22 — End: 2024-04-21

## 2023-04-22 MED ORDER — SPIRIVA RESPIMAT 2.5 MCG/ACTUATION SOLUTION FOR INHALATION
Freq: Every day | RESPIRATORY_TRACT | 11 refills | 30 days | Status: CP
Start: 2023-04-22 — End: 2024-04-21
  Filled 2023-04-28: qty 4, 30d supply, fill #0

## 2023-04-22 MED ORDER — OMEPRAZOLE 20 MG CAPSULE,DELAYED RELEASE
ORAL_CAPSULE | Freq: Every day | ORAL | 3 refills | 90 days
Start: 2023-04-22 — End: 2024-04-21

## 2023-04-22 MED ORDER — EMPTY CONTAINER
3 refills | 0 days
Start: 2023-04-22 — End: ?

## 2023-04-22 MED ORDER — ALBUTEROL SULFATE HFA 90 MCG/ACTUATION AEROSOL INHALER
RESPIRATORY_TRACT | 11 refills | 0 days | Status: CP | PRN
Start: 2023-04-22 — End: ?
  Filled 2023-04-28: qty 18, 33d supply, fill #0

## 2023-04-22 NOTE — Unmapped (Signed)
Requested Prescriptions     Pending Prescriptions Disp Refills    budesonide-formoterol (SYMBICORT) 160-4.5 mcg/actuation inhaler 10.2 g 11     Sig: Inhale 2 puffs Two (2) times a day.    tiotropium bromide (SPIRIVA RESPIMAT) 2.5 mcg/actuation inhalation mist 4 g 11     Sig: Inhale 1 puff daily.

## 2023-04-22 NOTE — Unmapped (Signed)
[facility] Clinical Assessment & Refill Coordination Note    SHARESA PALLESCHI, DOB: 04-27-1956  Phone: [phone] (home)     All above HIPAA information was verified with patient.     Was a Nurse, learning disability used for this call? No    Specialty Medication(s):   CF/Pulmonary/Asthma: Xolair 150mg  and General Specialty: Tymlos     Current Outpatient Medications   Medication Sig Dispense Refill    abaloparatide 80 mcg (3,120 mcg/1.56 mL) PnIj Inject 80 mcg under the skin daily. 1.56 mL 6    albuterol 2.5 mg /3 mL (0.083 %) nebulizer solution Inhale 3 mL (2.5 mg total) by nebulization every six (6) hours as needed for wheezing or shortness of breath. 360 mL 11    albuterol HFA 90 mcg/actuation inhaler Inhale 1 puff every four (4) hours as needed for wheezing. 8.5 g 11    budesonide-formoterol (SYMBICORT) 160-4.5 mcg/actuation inhaler Inhale 2 puffs Two (2) times a day. 10.2 g 11    cholecalciferol, vitamin D3-250 mcg, 10,000 unit,, 250 mcg (10,000 unit) capsule Take 1 capsule (250 mcg total) by mouth daily. 30 capsule 11    citalopram (CELEXA) 40 MG tablet Take 1 tablet (40 mg total) by mouth daily. 90 tablet 2    empty container (SHARPS CONTAINER) Misc Use as directed 1 each 2    EPINEPHrine (EPIPEN) 0.3 mg/0.3 mL injection Inject 0.3 mL (0.3 mg total) into the muscle once as needed for anaphylaxis for up to 1 dose. 2 each 1    hydroxychloroquine (PLAQUENIL) 200 mg tablet Take 1 tablet (200 mg total) by mouth daily. 90 tablet 3    ipratropium (ATROVENT) 0.02 % nebulizer solution Inhale the contents of 1 vial (500 mcg total) by nebulization Four (4) times a day. 62.5 mL 12    montelukast (SINGULAIR) 10 mg tablet Take 1 tablet (10 mg total) by mouth daily. 90 tablet 3    omalizumab (XOLAIR) 150 mg/mL syringe Inject the contents of 1 syringe (150 mg total) under the skin every fourteen (14) days. 2 mL 11    omeprazole (PRILOSEC) 20 MG capsule Take 1 capsule (20 mg total) by mouth daily. 90 capsule 3    pen needle, diabetic 32 gauge x 5/32 (4 mm) Ndle Use as directed to inject Tymlos 100 each 3    pilocarpine (SALAGEN) 5 MG tablet Take 1 tablet (5 mg total) by mouth Three (3) times a day. 270 tablet 3    pregabalin (LYRICA) 25 MG capsule Take 1 capsule (25 mg total) by mouth Two (2) times a day. 180 capsule 1    rosuvastatin (CRESTOR) 10 MG tablet Take 1 tablet (10 mg total) by mouth nightly. 90 tablet 3    traZODone (DESYREL) 50 MG tablet Take 0.5 tablets (25 mg total) by mouth nightly. 90 tablet 2     Current Facility-Administered Medications   Medication Dose Route Frequency Provider Last Rate Last Admin    sodium chloride 3 % nebulizer solution 4 mL  4 mL Nebulization Once Ellin Mayhew, MD            Changes to medications: Eloni reports no changes at this time.    Allergies   Allergen Reactions    [facility] Dust Cough       Changes to allergies: No    SPECIALTY MEDICATION ADHERENCE     Xolair 150 mg: 1 day of medicine on hand (2 for today)  Tymlos 80  mcg : 14  days of medicine on hand     Medication Adherence    Patient reported X missed doses in the last month: 0  Specialty Medication: Xolair 150 mg Q14d  Patient is on additional specialty medications: Yes  Additional Specialty Medications: Tymlos 80 mcg daily  Patient Reported Additional Medication X Missed Doses in the Last Month: 0  Patient is on more than two specialty medications: No  Any gaps in refill history greater than 2 weeks in the last 3 months: no  Demonstrates understanding of importance of adherence: yes  Informant: patient          Specialty medication(s) dose(s) confirmed: Regimen is correct and unchanged.     Are there any concerns with adherence? No    Adherence counseling provided? Not needed    CLINICAL MANAGEMENT AND INTERVENTION      Clinical Benefit Assessment:    Do you feel the medicine is effective or helping your condition? Yes    Clinical Benefit counseling provided? Not needed    Adverse Effects Assessment:    Are you experiencing any side effects? No    Are you experiencing difficulty administering your medicine? No    Quality of Life Assessment:    Quality of Life    Rheumatology  Oncology  Dermatology  Cystic Fibrosis          How many days over the past month did your asthma / osteoporosis  keep you from your normal activities? For example, brushing your teeth or getting up in the morning. Patient declined to answer    Have you discussed this with your provider? Not needed    Acute Infection Status:    Acute infections noted within Epic:  No active infections  Patient reported infection: None    Therapy Appropriateness:    Is therapy appropriate and patient progressing towards therapeutic goals? Yes, therapy is appropriate and should be continued    DISEASE/MEDICATION-SPECIFIC INFORMATION      For patients on injectable medications: Patient currently has 2 (Xolair) and at least 6 (Tymlos) doses left.  Next injection is scheduled for today - 5/30 (Xolair).    Asthma and Allergy: Have you had an asthma exacerbation in the last 30 days? No  Have you needed to use your rescue inhaler more often than usual in the last 30 days? Yes, reports increased asthma symptoms during spring allergy season  Have you needed to take steroids for your asthma in the last 30 days? No    PATIENT SPECIFIC NEEDS     Does the patient have any physical, cognitive, or cultural barriers?  No - but was confused with medication list. We reviewed all medications today  and answered all questions    Is the patient high risk? No    Did the patient require a clinical intervention? No    Does the patient require physician intervention or other additional services (i.e., nutrition, smoking cessation, social work)? No    SOCIAL DETERMINANTS OF HEALTH     At the [facility], we have learned that life circumstances - like trouble affording food, housing, utilities, or transportation can affect the health of many of our patients.   That is why we wanted to ask: are you currently experiencing any life circumstances that are negatively impacting your health and/or quality of life? Patient declined to answer    Social Determinants of Health     Financial Resource Strain: Medium Risk (11/04/2022)    Overall Financial Resource Strain (CARDIA)  Difficulty of Paying Living Expenses: Somewhat hard   Internet Connectivity: Not on file   Food Insecurity: Food Insecurity Present (11/04/2022)    Hunger Vital Sign     Worried About Running Out of Food in the Last Year: Sometimes true     Ran Out of Food in the Last Year: Sometimes true   Tobacco Use: Medium Risk (04/07/2023)    Patient History     Smoking Tobacco Use: Former     Smokeless Tobacco Use: Never     Passive Exposure: Never   Housing/Utilities: Low Risk  (11/04/2022)    Housing/Utilities     Within the past 12 months, have you ever stayed: outside, in a car, in a tent, in an overnight shelter, or temporarily in someone else's home (i.e. couch-surfing)?: No     Are you worried about losing your housing?: No     Within the past 12 months, have you been unable to get utilities (heat, electricity) when it was really needed?: No   Alcohol Use: Not At Risk (01/15/2022)    Alcohol Use     How often do you have a drink containing alcohol?: Never     How many drinks containing alcohol do you have on a typical day when you are drinking?: 1 - 2     How often do you have 5 or more drinks on one occasion?: Never   Transportation Needs: No Transportation Needs (11/04/2022)    PRAPARE - Transportation     Lack of Transportation (Medical): No     Lack of Transportation (Non-Medical): No   Substance Use: Low Risk  (01/15/2022)    Substance Use     Taken prescription drugs for non-medical reasons: Never     Taken illegal drugs: Never     Patient indicated they have taken drugs in the past year for non-medical reasons: Yes, [positive answer(s)]: Not on file   Health Literacy: Medium Risk (06/23/2021)    Health Literacy : Sometimes   Physical Activity: Not on file   Interpersonal Safety: Not on file   Stress: Not on file   Intimate Partner Violence: Not At Risk (06/23/2021)    Humiliation, Afraid, Rape, and Kick questionnaire     Fear of Current or Ex-Partner: No     Emotionally Abused: No     Physically Abused: No     Sexually Abused: No   Depression: At risk (06/23/2021)    PHQ-2     PHQ-2 Score: 3   Social Connections: Not on file       Would you be willing to receive help with any of the needs that you have identified today? Not applicable       SHIPPING     Specialty Medication(s) to be Shipped:   CF/Pulmonary/Asthma: Xolair 150 mg and General Specialty: Tymlos    Other medication(s) to be shipped:  cholecalciferol 10,000 units, citalopram 40mg , sharps container, hydroxychloroquine 200mg , montelukast 10mg , omeprazole 20mg , pilocarpine 5mg , pregabalin 25mg , Spiriva respimat 2.5 mcg/act, Symbicort 160-4.5 mcg/act, syringes with needles, and Ventolin inhaler     Changes to insurance: No    Delivery Scheduled: Yes, Expected medication delivery date: 04/28/23.     Medication will be delivered via Same Day Courier to the confirmed prescription address in [facility]    The patient will receive a drug information handout for each medication shipped and additional FDA Medication Guides as required.  Verified that patient has previously received a Conservation officer, historic buildings and a Technical sales engineer  Practices.    The patient or caregiver noted above participated in the development of this care plan and knows that they can request review of or adjustments to the care plan at any time.      All of the patient's questions and concerns have been addressed.    Oliva Bustard, PharmD   [facility] Specialty Pharmacist

## 2023-04-22 NOTE — Unmapped (Signed)
Requested Prescriptions     Pending Prescriptions Disp Refills    albuterol HFA 90 mcg/actuation inhaler 8.5 g 11     Sig: Inhale 1 puff every four (4) hours as needed for wheezing.

## 2023-04-23 MED ORDER — TRAZODONE 50 MG TABLET
ORAL_TABLET | Freq: Every evening | ORAL | 3 refills | 90 days | Status: CP
Start: 2023-04-23 — End: ?

## 2023-04-23 MED ORDER — PREGABALIN 25 MG CAPSULE
ORAL_CAPSULE | Freq: Two times a day (BID) | ORAL | 1 refills | 90 days | Status: CP
Start: 2023-04-23 — End: 2024-04-22
  Filled 2023-04-28: qty 180, 90d supply, fill #0

## 2023-04-23 MED ORDER — OMEPRAZOLE 20 MG CAPSULE,DELAYED RELEASE
ORAL_CAPSULE | Freq: Every day | ORAL | 3 refills | 90 days | Status: CP
Start: 2023-04-23 — End: 2024-04-22
  Filled 2023-04-28: qty 90, 90d supply, fill #0

## 2023-04-28 MED FILL — MONTELUKAST 10 MG TABLET: ORAL | 90 days supply | Qty: 90 | Fill #1

## 2023-04-28 MED FILL — TYMLOS 80 MCG/DOSE (3,120 MCG/1.56 ML) SUBCUTANEOUS PEN INJECTOR: SUBCUTANEOUS | 30 days supply | Qty: 1.56 | Fill #1

## 2023-04-28 MED FILL — ULTICARE PEN NEEDLE 32 GAUGE X 5/32" (4 MM): 90 days supply | Qty: 100 | Fill #1

## 2023-04-28 MED FILL — SYMBICORT 160 MCG-4.5 MCG/ACTUATION HFA AEROSOL INHALER: RESPIRATORY_TRACT | 30 days supply | Qty: 10.2 | Fill #0

## 2023-04-28 MED FILL — XOLAIR 150 MG/ML SUBCUTANEOUS SYRINGE: SUBCUTANEOUS | 28 days supply | Qty: 2 | Fill #3

## 2023-04-28 MED FILL — EMPTY CONTAINER: 90 days supply | Qty: 1 | Fill #0

## 2023-04-28 MED FILL — HYDROXYCHLOROQUINE 200 MG TABLET: ORAL | 90 days supply | Qty: 90 | Fill #2

## 2023-04-28 MED FILL — CHOLECALCIFEROL (VITAMIN D3) 250 MCG (10,000 UNIT) CAPSULE: ORAL | 30 days supply | Qty: 30 | Fill #3

## 2023-04-28 MED FILL — PILOCARPINE 5 MG TABLET: ORAL | 90 days supply | Qty: 270 | Fill #1

## 2023-04-28 MED FILL — CITALOPRAM 40 MG TABLET: ORAL | 90 days supply | Qty: 90 | Fill #1

## 2023-05-24 NOTE — Unmapped (Signed)
[facility] Refill Coordination Note    Specialty Medication(s) to be Shipped:   CF/Pulmonary/Asthma: Xolair 150 mg     Other medication(s) to be shipped:  symbicort 160-4.5mg , spiriva 2.5, ventolin      Christy Ibarra, DOB: July 05, 1956  Phone: [phone] (home)       All above HIPAA information was verified with patient.     Was a Nurse, learning disability used for this call? No    Completed refill call assessment today to schedule patient's medication shipment from the [facility] [phone]).  All relevant notes have been reviewed.     Specialty medication(s) and dose(s) confirmed: Regimen is correct and unchanged.   Changes to medications: Myasia reports no changes at this time.  Changes to insurance: No  New side effects reported not previously addressed with a pharmacist or physician: None reported  Questions for the pharmacist: No    Confirmed patient received a Conservation officer, historic buildings and a Surveyor, mining with first shipment. The patient will receive a drug information handout for each medication shipped and additional FDA Medication Guides as required.       DISEASE/MEDICATION-SPECIFIC INFORMATION        For patients on injectable medications: Patient currently has 1 doses left.  Next injection is scheduled for 07/11//2024,.    SPECIALTY MEDICATION ADHERENCE     Medication Adherence    Patient reported X missed doses in the last month: 0  Specialty Medication: TYMLOS 80 mcg (3,120 mcg/1.56 mL) Pnij (abaloparatide)  Patient is on additional specialty medications: Yes  Additional Specialty Medications: XOLAIR 150 mg/mL syringe (omalizumab)  Patient Reported Additional Medication X Missed Doses in the Last Month: 0              Were doses missed due to medication being on hold? No      TYMLOS 80 mcg (3,120 mcg/1.56 mL) Pnij (abaloparatide): 5 days of medicine on hand     XOLAIR 150 mg/mL syringe (omalizumab): 14 days of medicine on hand       REFERRAL TO PHARMACIST     Referral to the pharmacist: Not needed      [facility]     Shipping address confirmed in Epic.       Delivery Scheduled: Yes, Expected medication delivery date: 06/02/2023.     Medication will be delivered via UPS to the prescription address in Epic [facility].    [name]   [facility] Specialty TechnicianJanet ROSY CURL has been contacted in regards to their refill of TYMLOS 80 mcg (3,120 mcg/1.56 mL) Pnij (abaloparatide). At this time, they have declined refill due to patient having 3 boxes of  doses remaining. Refill assessment call date has been updated per the patient's request.

## 2023-05-28 NOTE — Unmapped (Signed)
[facility] 's Xolair  shipment will be sent out  as a result of pt called back to change delivery date to time she will be home to receive      and communicated the delivery change. We will call the patient back to reschedule the delivery upon resolution. We have confirmed the delivery date as 07/09, via same day courier.

## 2023-06-01 MED FILL — SYMBICORT 160 MCG-4.5 MCG/ACTUATION HFA AEROSOL INHALER: RESPIRATORY_TRACT | 30 days supply | Qty: 10.2 | Fill #1

## 2023-06-01 MED FILL — XOLAIR 150 MG/ML SUBCUTANEOUS SYRINGE: SUBCUTANEOUS | 28 days supply | Qty: 2 | Fill #4

## 2023-06-01 MED FILL — SPIRIVA RESPIMAT 2.5 MCG/ACTUATION SOLUTION FOR INHALATION: RESPIRATORY_TRACT | 60 days supply | Qty: 4 | Fill #1

## 2023-06-01 MED FILL — VENTOLIN HFA 90 MCG/ACTUATION AEROSOL INHALER: RESPIRATORY_TRACT | 33 days supply | Qty: 18 | Fill #1

## 2023-06-01 NOTE — Unmapped (Signed)
[facility]    Initial History of present illness:     Christy Ibarra is a 67 y.o. female , referred by Johnsie Cancel,* for evaluation of Plaquenil.   Ref. Lamont Dowdy, MD for Retina Evaluation / Hx of Sjogren's syndrome with keratoconjunctivitis sicca w/ Long-term use of PlaquenilStable vision, no flashes and stable floaters noticed monthly (both eyes)  Plaquenil 200mg  daily / since 2021    M35.01 (ICD-10-CM) - Sjogren's syndrome with keratoconjunctivitis sicca (CMS-HCC)  Z79.899 (ICD-10-CM) - Long-term use of Plaquenil           has a past medical history of Anxiety, Anxiety (10/09/2019), Bronchiectasis without complication (CMS-HCC) (03/18/2022), Caregiver stress (06/23/2021), Cataract, COPD (chronic obstructive pulmonary disease) (CMS-HCC), Depression, Dry eyes, Grief (11/12/2021), Joint pain, 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       Assessment and plan:     # Plaquenil use   Plaquenil use since 2021  Stable dose of 200 mg qd  Recommend baseline exam and then annual testing after 5 years of use, or sooner if risk factors are present including: Duration of use > 5 years, Cumulative dose > 1000 g, Daily dose > 400 mg/day or >5 mg/kg or short stature, elderly, kidney or liver dysfunction, other retinal disease or maculopathy or tamoxifen use    First visit today KLW.  Patient overdue for testing.  Some mild signs of irregularity but no definite signs of toxicity.  No current signs of toxicity. Ok to continue using plaquenil.     Follow up 6-12 months , OCT, [facility]     # Sjogren's disease  Treated with Plaquenil  Symptoms stable  Continue follow-up with rheumatology      # Pseudophakia both eyes   stable  monitor      Copy to :   Referring provider   Primary Care Provider Johnsie Cancel, MD    INTERPRETATION EXTENDED OPHTHALMOSCOPY MACULA (90 [address]lens)  Macula - right: vitreous syneresis,  Normal  Macula - left:  vitreous syneresis, Normal         06/02/2023   I saw and evaluated the patient, participating in the key portions of the service.  I reviewed the resident???s note.  I agree with the resident???s findings and plan including interpretation of images.

## 2023-06-02 ENCOUNTER — Ambulatory Visit: Admit: 2023-06-02 | Discharge: 2023-06-03 | Payer: MEDICAID

## 2023-06-02 DIAGNOSIS — M3501 Sicca syndrome with keratoconjunctivitis: Principal | ICD-10-CM

## 2023-06-02 DIAGNOSIS — Z79899 Other long term (current) drug therapy: Principal | ICD-10-CM

## 2023-06-02 NOTE — Unmapped (Signed)
Plaquenil Information    What Is Plaquenil?      Plaquenil (Hydroxychloroquine sulfate) is a drug used to treat certain autoimmune diseases. This type of disease happens when the body's immune system attacks its own healthy tissue. Some diseases treated with Plaquenil are:  Lupus, which causes fever, rashes, skin problems, and other symptoms.  Rheumatoid arthritis, a condition that causes pain and swelling in the joints of your hands and feet.  Sj??gren???s syndrome, which causes dry eyes and dry mouth.  Plaquenil lowers your immune system???s ability to cause inflammation. This can help control symptoms like rashes, skin and mouth sores and joint pain.    Plaquenil and your eyes  A rare side effect of Plaquenil is damage to the eye's retina. The retina is the light-sensitive tissue at the back of the eye. Using Plaquenil for a long period of time may harm the retina, causing serious vision loss.  People with retinal damage from Plaquenil are not aware at first that they are losing vision. Unfortunately, vision loss from Plaquenil can be permanent. If you take Plaquenil, it is very important to see an ophthalmologist regularly. Your ophthalmologist will check your retina for problems before serious damage occurs.  Who is at risk for vision loss with Plaquenil?  Some people who take Plaquenil have a higher risk of retinal damage than others. They include:  people taking Plaquenil for 5 or more years  people taking Plaquenil at higher doses than recommended  people who already have retinal disease  people who already have kidney or liver disease  people over 60 years old  people who lose a lot of weight while taking Plaquenil. Tell your doctor if this happens as your dose may need to be lowered.  When should you see an ophthalmologist when taking Plaquenil?  If you take Plaquenil, here is when to see an ophthalmologist:  Before Plaquenil treatment begins. You will have a ???baseline??? eye exam. This measures the health of your eyes and looks for retinal or macular disease.  Once a year while taking Plaquenil.  After five years of Plaquenil treatment, when retina damage is likely to begin, you need to see your ophthalmologist every six months. Special tests can help find early damage to the retina before serious problems develop.  How do ophthalmologists look for Plaquenil damage?  Your ophthalmologist may use these tests to look for retina damage from Plaquenil:  Visual field test: During this test you sit down and look into a bowl-like machine. Small lights flash at different points inside the bowl. When you see the lights, you press a button. This test can help find if blind spots have developed in your field of vision.  OCT imaging: This test makes a detailed, three-dimensional image of your eye. Your ophthalmologist can use this image to look for early retinal damage.  Multifocal ERG looks at cells in the retina called rods and cones. The test measures how well these cells respond to light. It can find retinal damage from Plaquenil.  Photos: A special camera takes pictures of the retina. Photos show damaged areas as small spots of light.        If you take Plaquenil, it is important to see an ophthalmologist while you take this medicine.  What happens if retinal damage is found?  If your ophthalmologist finds any signs of retinal damage, he or she will tell your primary care doctor or inflammatory disease specialist to stop Plaquenil treatment immediately. This will help you avoid   permanent vision loss. Your other doctor will find another treatment for your disease.      For more information see   [email or web address].org/[email or web address]-health/[email or web address]-[email or web address]

## 2023-06-22 NOTE — Unmapped (Signed)
[facility] Refill Coordination Note    Specialty Medication(s) to be Shipped:   CF/Pulmonary/Asthma: XOLAIR 150 mg/mL syringe (omalizumab)    Other medication(s) to be shipped:  Symbicort, Ventolin, cholecalciferol     Christy Ibarra, DOB: 1956-08-28  Phone: [phone] (home)       All above HIPAA information was verified with patient.     Was a Nurse, learning disability used for this call? No    Completed refill call assessment today to schedule patient's medication shipment from the [facility] [phone]).  All relevant notes have been reviewed.     Specialty medication(s) and dose(s) confirmed: Regimen is correct and unchanged.   Changes to medications: Christy Ibarra reports no changes at this time.  Changes to insurance: No  New side effects reported not previously addressed with a pharmacist or physician: None reported  Questions for the pharmacist: No    Confirmed patient received a Conservation officer, historic buildings and a Surveyor, mining with first shipment. The patient will receive a drug information handout for each medication shipped and additional FDA Medication Guides as required.       DISEASE/MEDICATION-SPECIFIC INFORMATION        For patients on injectable medications: Patient currently has 1 doses left.  Next injection is scheduled for 06/24/23.    SPECIALTY MEDICATION ADHERENCE     Medication Adherence    Patient reported X missed doses in the last month: 0  Specialty Medication: XOLAIR 150 mg/mL syringe (omalizumab)  Patient is on additional specialty medications: No  Patient is on more than two specialty medications: No  Any gaps in refill history greater than 2 weeks in the last 3 months: no  Demonstrates understanding of importance of adherence: yes  Informant: patient  Reliability of informant: reliable  Provider-estimated medication adherence level: good  Patient is at risk for Non-Adherence: No  Reasons for non-adherence: no problems identified              Were doses missed due to medication being on hold? No    XOLAIR 150 mg/mL syringe (omalizumab)  : 14 days of medicine on hand   TYMLOS 80 mcg (3,120 mcg/1.56 mL) Pnij (abaloparatide)  :90 days of medicine on hand       REFERRAL TO PHARMACIST     Referral to the pharmacist: Not needed      [facility]     Shipping address confirmed in Epic.       Delivery Scheduled: Yes, Expected medication delivery date: 06/30/23.     Medication will be delivered via Same Day Courier to the prescription address in Epic WAM.    Christy Ibarra' W Christy Ibarra Shared [facility] Specialty Technician

## 2023-06-30 MED FILL — XOLAIR 150 MG/ML SUBCUTANEOUS SYRINGE: SUBCUTANEOUS | 28 days supply | Qty: 2 | Fill #5

## 2023-06-30 MED FILL — SYMBICORT 160 MCG-4.5 MCG/ACTUATION HFA AEROSOL INHALER: RESPIRATORY_TRACT | 30 days supply | Qty: 10.2 | Fill #2

## 2023-06-30 MED FILL — VENTOLIN HFA 90 MCG/ACTUATION AEROSOL INHALER: RESPIRATORY_TRACT | 33 days supply | Qty: 18 | Fill #2

## 2023-06-30 MED FILL — CHOLECALCIFEROL (VITAMIN D3) 250 MCG (10,000 UNIT) CAPSULE: ORAL | 30 days supply | Qty: 30 | Fill #4

## 2023-07-07 ENCOUNTER — Ambulatory Visit: Admit: 2023-07-07 | Discharge: 2023-07-08 | Payer: MEDICAID

## 2023-07-07 DIAGNOSIS — J4489 Asthma-COPD overlap syndrome (CMS-HCC): Principal | ICD-10-CM

## 2023-07-07 DIAGNOSIS — J470 Bronchiectasis with acute lower respiratory infection: Principal | ICD-10-CM

## 2023-07-07 MED ORDER — LEVOFLOXACIN 750 MG TABLET
ORAL_TABLET | Freq: Every day | ORAL | 0 refills | 14 days | Status: CP
Start: 2023-07-07 — End: 2023-07-21

## 2023-07-07 MED ORDER — BREZTRI AEROSPHERE 160 MCG-9MCG-4.8MCG/ACTUATION HFA AEROSOL INHALER
Freq: Two times a day (BID) | RESPIRATORY_TRACT | 3 refills | 0 days | Status: CP
Start: 2023-07-07 — End: 2024-07-06
  Filled 2023-08-25: qty 32.1, 90d supply, fill #0

## 2023-07-07 MED ORDER — PREDNISONE 10 MG TABLET
ORAL_TABLET | ORAL | 0 refills | 14 days | Status: CP
Start: 2023-07-07 — End: 2023-07-21

## 2023-07-07 NOTE — Unmapped (Addendum)
Lovely to see you today!    - Start prednisone taper and levofloxacin for 14 days  - Switch to BREZTRI 2 puffs twice daily from symbicort + spiriva  - Continue airway clearance -- I will work on vest therapy for you!    Christy Ibarra E. Talmadge Coventry, MD  Pulmonary & Critical Care Fellow  [facility]  Phone: [phone]  Fax: [phone]
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
## 2023-07-08 NOTE — Unmapped (Signed)
I saw and evaluated the patient, participating in the key portions of the service.  I reviewed the resident???s note.  I agree with the resident???s findings and plan.        Montine Circle, MD

## 2023-07-23 DIAGNOSIS — J4489 Asthma-COPD overlap syndrome (CMS-HCC): Principal | ICD-10-CM

## 2023-07-23 DIAGNOSIS — J470 Bronchiectasis with acute lower respiratory infection: Principal | ICD-10-CM

## 2023-07-23 NOTE — Unmapped (Signed)
[facility] Refill Coordination Note    Specialty Medication(s) to be Shipped:   CF/Pulmonary/Asthma: XOLAIR 150 mg/mL syringe (omalizumab)    Other medication(s) to be shipped:  pregabalin, citalopram, hydroxychloroquine, montelukast, omeprazole, pilocarpine, rosuvastatin, vitamin D3,      Christy Ibarra, DOB: June 22, 1956  Phone: [phone] (home)       All above HIPAA information was verified with patient.     Was a Nurse, learning disability used for this call? No    Completed refill call assessment today to schedule patient's medication shipment from the [facility] [phone]).  All relevant notes have been reviewed.     Specialty medication(s) and dose(s) confirmed: Regimen is correct and unchanged.   Changes to medications: Lyniah reports no changes at this time.  Changes to insurance: No  New side effects reported not previously addressed with a pharmacist or physician: None reported  Questions for the pharmacist: No    Confirmed patient received a Conservation officer, historic buildings and a Surveyor, mining with first shipment. The patient will receive a drug information handout for each medication shipped and additional FDA Medication Guides as required.       DISEASE/MEDICATION-SPECIFIC INFORMATION        For patients on injectable medications: Patient currently has 1 doses left.  Next injection is scheduled for 07/30/23.    SPECIALTY MEDICATION ADHERENCE     Medication Adherence    Patient reported X missed doses in the last month: 0  Specialty Medication: XOLAIR 150 mg/mL syringe (omalizumab)  Patient is on additional specialty medications: No  Patient is on more than two specialty medications: No  Any gaps in refill history greater than 2 weeks in the last 3 months: no  Demonstrates understanding of importance of adherence: yes  Informant: patient  Reliability of informant: reliable  Provider-estimated medication adherence level: good  Patient is at risk for Non-Adherence: No  Reasons for non-adherence: no problems identified              Were doses missed due to medication being on hold? No    XOLAIR 150 mg/mL syringe (omalizumab)  : 1 doses of medicine on hand       REFERRAL TO PHARMACIST     Referral to the pharmacist: Not needed      SHIPPING     Shipping address confirmed in Epic.       Delivery Scheduled: Yes, Expected medication delivery date: 07/29/23.     Medication will be delivered via Same Day Courier to the prescription address in Epic WAM.    Kahli Fitzgerald[facility] Specialty Technician

## 2023-07-29 MED FILL — CITALOPRAM 40 MG TABLET: ORAL | 90 days supply | Qty: 90 | Fill #2

## 2023-07-29 MED FILL — CHOLECALCIFEROL (VITAMIN D3) 250 MCG (10,000 UNIT) CAPSULE: ORAL | 30 days supply | Qty: 30 | Fill #5

## 2023-07-29 MED FILL — ROSUVASTATIN 10 MG TABLET: ORAL | 90 days supply | Qty: 90 | Fill #1

## 2023-07-29 MED FILL — HYDROXYCHLOROQUINE 200 MG TABLET: ORAL | 90 days supply | Qty: 90 | Fill #3

## 2023-07-29 MED FILL — PILOCARPINE 5 MG TABLET: ORAL | 90 days supply | Qty: 270 | Fill #2

## 2023-07-29 MED FILL — PREGABALIN 25 MG CAPSULE: ORAL | 90 days supply | Qty: 180 | Fill #1

## 2023-07-29 MED FILL — OMEPRAZOLE 20 MG CAPSULE,DELAYED RELEASE: ORAL | 90 days supply | Qty: 90 | Fill #1

## 2023-07-29 MED FILL — XOLAIR 150 MG/ML SUBCUTANEOUS SYRINGE: SUBCUTANEOUS | 28 days supply | Qty: 2 | Fill #6

## 2023-07-29 MED FILL — MONTELUKAST 10 MG TABLET: ORAL | 90 days supply | Qty: 90 | Fill #2

## 2023-08-18 ENCOUNTER — Ambulatory Visit: Admit: 2023-08-18 | Discharge: 2023-08-19 | Payer: MEDICAID

## 2023-08-19 NOTE — Unmapped (Signed)
Patient was notified of operational disruptions. Patient opted to: schedule their refill with understanding of potential delay until 10/1 or later. This was facilitated by pharmacy staff     [facility] Refill Coordination Note    Specialty Medication(s) to be Shipped:   CF/Pulmonary/Asthma: XOLAIR 150 mg/mL syringe (omalizumab)    Other medication(s) to be shipped:  Christy Ibarra, DOB: 1956-11-09  Phone: [phone] (home)       All above HIPAA information was verified with patient.     Was a Nurse, learning disability used for this call? No    Completed refill call assessment today to schedule patient's medication shipment from the [facility]  [phone]).  All relevant notes have been reviewed.     Specialty medication(s) and dose(s) confirmed: Regimen is correct and unchanged.   Changes to medications: Christy Ibarra reports no changes at this time.  Changes to insurance: No  New side effects reported not previously addressed with a pharmacist or physician: None reported  Questions for the pharmacist: No    Confirmed patient received a Conservation officer, historic buildings and a Surveyor, mining with first shipment. The patient will receive a drug information handout for each medication shipped and additional FDA Medication Guides as required.       DISEASE/MEDICATION-SPECIFIC INFORMATION        For patients on injectable medications: Patient currently has 0 doses left.  Next injection is scheduled for 08/25/23.    SPECIALTY MEDICATION ADHERENCE     Medication Adherence    Patient reported X missed doses in the last month: 1  Specialty Medication: XOLAIR 150 mg/mL syringe (omalizumab)  Patient is on additional specialty medications: Yes  Additional Specialty Medications: TYMLOS 80 mcg (3,120 mcg/1.56 mL) Pnij (abaloparatide)  Patient Reported Additional Medication X Missed Doses in the Last Month: 0  Patient is on more than two specialty medications: No  Any gaps in refill history greater than 2 weeks in the last 3 months: no  Demonstrates understanding of importance of adherence: yes  Informant: patient  Reliability of informant: reliable  Provider-estimated medication adherence level: good  Patient is at risk for Non-Adherence: No  Reasons for non-adherence: no problems identified              Were doses missed due to medication being on hold? No    XOLAIR 150 mg/mL syringe (omalizumab)  : 0 doses of medicine on hand   TYMLOS 80 mcg (3,120 mcg/1.56 mL) Pnij (abaloparatide)  : 30 days of medicine on hand       REFERRAL TO PHARMACIST     Referral to the pharmacist: Not needed      [facility]     Shipping address confirmed in Epic.       Delivery Scheduled: Yes, Expected medication delivery date: 08/25/23.     Medication will be delivered via Same Day Courier to the prescription address in Epic WAM.    [facility]y [facility]  Specialty Technician

## 2023-08-25 MED FILL — XOLAIR 150 MG/ML SUBCUTANEOUS SYRINGE: SUBCUTANEOUS | 28 days supply | Qty: 2 | Fill #7

## 2023-08-25 NOTE — Unmapped (Signed)
Lung Cancer Screening Program    12 month Low Dose CT completed 08/18/2023. Sent order to Dr. Doralee Albino and Dr. Talmadge Coventry to review and sign if they feel appropriate and to advise if inappropriate.    Legrand Pitts DNP, RN, CNL  Nurse Navigator  [facility] Multidisciplinary Lung Screening Program  T: [phone]

## 2023-08-26 DIAGNOSIS — R9389 Abnormal findings on diagnostic imaging of other specified body structures: Principal | ICD-10-CM

## 2023-08-26 DIAGNOSIS — J470 Bronchiectasis with acute lower respiratory infection: Principal | ICD-10-CM

## 2023-08-26 MED ORDER — LEVOFLOXACIN 750 MG TABLET
ORAL_TABLET | Freq: Every day | ORAL | 0 refills | 14 days | Status: CP
Start: 2023-08-26 — End: 2023-09-09

## 2023-08-26 NOTE — Unmapped (Signed)
Pulmonology Telephone Note    Spoke with Jan to discuss the results of their imaging & cough. Discussed case with home device coordinator - Jan has her vest but is using it infrequently. Home coordinator also raised concerns about the environment in the home with pets -- Jan has previously expressed that it is difficult to keep up with housework when she is feeling poorly and her daughter often assists. That said, I do also worry that a high level of antigens in the home may be driving pulmonary symptoms.   Jan tells me that the vest is heavy and tires her out, but she does think it helps to bring up the sputum. CT chest showed active infection - persisting after course of levofloxacin given last month. Jan continues to have productive cough, yellowish sputum & fatigue. We discussed the options of trying another round of oral antibiotics with airway clearance (duoneb/hts3%/vest/aerobika) twice daily OR coming into the hospital for IV antibiotics & treatment of bronchiectasis exacerbation with 4x daily airway clearance -- which she has needed in the past. She would like to start with oral antibiotics and will check in if symptoms are not improving & at the end of her abx course. She will need repeat CT to follow up her consolidation & complete lung cancer screening.     Actions:   - levofloxacin 750 x 14 days ordered  - AC bid duoneb/hts3%/vest/aerobika  - ct chest at 3 mo ordered    All questions answered.     Nyaja Dubuque E. Talmadge Coventry, MD  Pulmonary & Critical Care Fellow  Pager: [phone]

## 2023-08-26 NOTE — Unmapped (Signed)
Lung cancer screening program    Left message with Ms. Benassi on 08/26/2023 to state that we scheduled her CT scan ordered by Dr. Talmadge Coventry immediately before her appointment with Dr. Talmadge Coventry on 12/22/2023.    Legrand Pitts DNP, RN, CNL  Nurse Navigator  [facility] Multidisciplinary Lung Screening Program  T: [phone]

## 2023-08-31 ENCOUNTER — Ambulatory Visit: Admit: 2023-08-31 | Discharge: 2023-09-01 | Payer: MEDICAID | Attending: Internal Medicine | Primary: Internal Medicine

## 2023-08-31 DIAGNOSIS — E785 Hyperlipidemia, unspecified: Principal | ICD-10-CM

## 2023-08-31 DIAGNOSIS — F418 Other specified anxiety disorders: Principal | ICD-10-CM

## 2023-08-31 DIAGNOSIS — M8000XD Age-related osteoporosis with current pathological fracture, unspecified site, subsequent encounter for fracture with routine healing: Principal | ICD-10-CM

## 2023-08-31 DIAGNOSIS — J4489 Asthma-COPD overlap syndrome (CMS-HCC): Principal | ICD-10-CM

## 2023-08-31 DIAGNOSIS — J471 Bronchiectasis with (acute) exacerbation: Principal | ICD-10-CM

## 2023-08-31 DIAGNOSIS — R198 Other specified symptoms and signs involving the digestive system and abdomen: Principal | ICD-10-CM

## 2023-08-31 MED ORDER — CITALOPRAM 20 MG TABLET
ORAL_TABLET | Freq: Every day | ORAL | 3 refills | 90 days | Status: CP
Start: 2023-08-31 — End: 2024-08-30
  Filled 2023-10-01: qty 90, 90d supply, fill #0

## 2023-08-31 NOTE — Unmapped (Unsigned)
Internal Medicine Clinic Visit    Reason for Visit: Bronchiectasis, HLD, depression    A/P:    1. Bronchiectasis with exacerbation (CMS-HCC) / 2. Asthma-COPD overlap syndrome (CMS-HCC)  Following with [facility] pulmonology. Treating for exacerbation with Levaquin. Doing great with her airway clearance: duonebs, hypertonic saline, Aerobika.  Repeat CT Chest scheduled for 12/30.     3.  Severe osteoporosis with current pathological fracture with routine healing, subsequent encounter  Following with [facility] endocrinology. With previous fragility fracture (right hip fracture in 2022 s/p THA).  Been on Tymlos since 01/2022.    - Discussed repeat DEXA due 12/2023  - Overdue for follow up; reminded to schedule   - Vitamin D 25 Hydroxy (25OH D2 + D3); Future    4. Mixed anxiety depressive disorder / 5. Grief   Daughter and her note mood is pretty good. PHQ score is 3, GAD score is 7. She is interested in trying a lower dose of Celexa since much of her mood was related to stressors taking care of her now deceased husband. If anxiety worsens, we will increase is back.   - Decrease Celexa from 40 to 20 mg daily   - Continue trazodone 25 mg nightly as needed; she may trial 12.5 as she feels tired the next day     6. Abnormal findings on esophagogastroduodenoscopy (EGD)  Following with [facility].  She previously had dysphagia but this resolved with PPI.  EGD 11/2021 was notable for nodularity at the GE junction and pathology showed intestinal metaplasia.  Follow-up EGD 03/2023 with [facility] and chronic gastritis. Per GI, no EoE, no Barrett's, a few signs that suggest real GERD but no need to survey or treat.     7. Hyperlipidemia  Started on Crestor 10 mg daily.   - Lipid Panel; Future    Not discussed:     Sjogren's syndrome with keratoconjunctivitis sicca (CMS-HCC)  Follows with [facility] rheumatology.  Treated with Plaquenil and pilocarpine. UTD with eye exam (last 05/2023).     Chronic midline low back pain / Gait abnormality   Long-standing and not worsening.  From chart review, she reportedly experienced rapid decline in 07/2019 and was diagnosed with Sjogren's. 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 She also worked with PT from 09/2019 through 11/2019. It is unclear if the precipitating event was due to transverse myelitis that occurred earlier. Of note, she had an episode of back pain in 02/2018 which correlates with the onset of her symptoms. Followed with Neurology since then. LP done 2023 (delay in getting this done due to follow up issues) without evidence of oligoclonal bands (if present they were planning to talk to Rheumatology about switch from plaquenil to cellcept). Then [facility], They felt she had bilateral low back pain with radiculitis left LE. H/o shingles- possible post-shingles radiculitis. Bilateral cervical radiculopathy vs myopathy (Sjogrens myopathy or associated [employer]). She declined lumbar MRI and EMG given symptoms were stable. Encouraged to continue home exercises. Lyrica started for shingles pain but she finds helpful for back pain/radiculitis so prefers to continue.   - Continue Lyrica 25 BID    Cancer screening:  Colon: Negative 02/2023  Cervical: done on 11/2020, no abnormalities, will repeat in 2027   Breast: Done 09/2022  Lung: Quit in 2009     Immunizations:  - Flu shot: Given today  - Pneumonia: Pneumovax 23 given 05/2020  - Shingrix: Unable to afford it   -  Tdap/Td: Due 2032  - COVID: Given today  - RSV: Done in March      Other:  - HIV screen: Non-reactive in 2022  - DEXA scan: Per Endocrinology   - HCV: Non-reactive in 2022  - Hemoglobin A1C: 5.4 in 2020  - Lipid panel: Ordered    Return in about 6 months (around 02/29/2024).     I personally spent 34 minutes face-to-face and non-face-to-face in the care of this patient, which includes all pre, intra, and post visit time on the date of service.  __________________________________________________________    HPI:    66 year old with a history of Sjogren's syndrome, anxiety/depression/PTSD, leukopenia, gait instability, osteoporosis, bronchiectasis and asthma/COPD overlap who presents today for follow up.     Taking Vitamin D.  Unsure of dose.    Pulmonology started her on Levaquin, 14 day course. Wearing her vest twice daily. She does feel like she is improving. Still gets out of breath and deals with losing energy. Repeat CT scheduled for 12/30. No hemoptysis. No fevers. Doing nebs/HTS twice daily as well. Breztri working well for her.     GERD: She has been on omeprazole.     Started on Crestor at the last visit. No symptoms.   __________________________________________________________    Problem List:  Patient Active Problem List   Diagnosis    Abnormal gait    Anxiety    Arthralgia of hip    Family history of genetic disorder    Family history of malignant neoplasm of breast    Family history of malignant neoplasm of cervix    Scoliosis    Sjogren's syndrome with keratoconjunctivitis sicca (CMS-HCC)    Leukopenia    Age-related nuclear cataract of both eyes    Poor sleep pattern    Osteoporosis with current pathological fracture    Bronchiectasis without complication (CMS-HCC)    Asthma-COPD overlap syndrome (CMS-HCC)    Esophageal dysphagia       Medications:  Reviewed in EPIC  __________________________________________________________    Physical Exam:   Vital Signs:  Vitals:    08/31/23 1120 08/31/23 1126   BP: 90/53 113/64   BP Site: L Arm L Arm   BP Position: Sitting Sitting   BP Cuff Size: Medium Medium   Pulse: 91 90   Resp: 17    Temp: 36.4 ??C (97.5 ??F)    TempSrc: Temporal    SpO2: 97%    Weight: 55.3 kg (122 lb)    Height: 170.2 cm (5' 7)         Body mass index is 19.11 kg/m??.    Gen: Appears at baseline  Cardio: Regular rate and rhythm  Pulm: CTAB Abnormal gait    Anxiety    Arthralgia of hip    Family history of genetic disorder    Family history of malignant neoplasm of breast    Family history of malignant neoplasm of cervix    Scoliosis    Sjogren's syndrome with keratoconjunctivitis sicca (CMS-HCC)    Leukopenia    Age-related nuclear cataract of both eyes    Poor sleep pattern    Osteoporosis with current pathological fracture    Bronchiectasis without complication (CMS-HCC)    Asthma-COPD overlap syndrome (CMS-HCC)    Esophageal dysphagia       Medications:  Reviewed in EPIC  __________________________________________________________    Physical Exam:   Vital Signs:  Vitals:    08/31/23 1120 08/31/23 1126   BP: 90/53 113/64   BP Site: L  Arm L Arm   BP Position: Sitting Sitting   BP Cuff Size: Medium Medium   Pulse: 91 90   Resp: 17    Temp: 36.4 ??C (97.5 ??F)    TempSrc: Temporal    SpO2: 97%    Weight: 55.3 kg (122 lb)    Height: 170.2 cm (5' 7)         Body mass index is 19.11 kg/m??.    Gen: Appears at baseline  Cardio: Regular rate and rhythm  Pulm: CTAB  Ext: No LE edema  Neuro: Alert and oriented to situation.

## 2023-08-31 NOTE — Unmapped (Addendum)
Reminder to schedule follow up with Endocrinology (supposed to be seen again in 05/2023)    Go down on Celexa to 20 mg daily. If anxiety or depression worsen prior to next appointment, schedule a visit with me. Virtual is fine.     Ask if Rheumatology can get the lipid panel and vitamin D level I ordered during your visit in November.

## 2023-09-22 NOTE — Unmapped (Signed)
Patient was notified of operational disruptions. Patient opted to: schedule their refill with understanding of potential delay until 10/1 or later. This was facilitated by pharmacy staff     [facility] Refill Coordination Note    Specialty Medication(s) to be Shipped:   CF/Pulmonary/Asthma: XOLAIR 150 mg/mL syringe (omalizumab)    Other medication(s) to be shipped:  citalopram      Andres Shad, DOB: [birth year]  Phone: [phone] (home)       All above HIPAA information was verified with patient.     Was a Nurse, learning disability used for this call? No    Completed refill call assessment today to schedule patient's medication shipment from the [facility]  [phone]).  All relevant notes have been reviewed.     Specialty medication(s) and dose(s) confirmed: Regimen is correct and unchanged.   Changes to medications: Miliani reports no changes at this time.  Changes to insurance: No  New side effects reported not previously addressed with a pharmacist or physician: None reported  Questions for the pharmacist: No    Confirmed patient received a Conservation officer, historic buildings and a Surveyor, mining with first shipment. The patient will receive a drug information handout for each medication shipped and additional FDA Medication Guides as required.       DISEASE/MEDICATION-SPECIFIC INFORMATION        For patients on injectable medications: Patient currently has 1 doses left.  Next injection is scheduled for 10/06/23.    SPECIALTY MEDICATION ADHERENCE     Medication Adherence    Patient reported X missed doses in the last month: 0  Specialty Medication: XOLAIR 150 mg/mL syringe (omalizumab)  Patient is on additional specialty medications: No  Patient is on more than two specialty medications: No  Any gaps in refill history greater than 2 weeks in the last 3 months: no  Demonstrates understanding of importance of adherence: yes              Were doses missed due to medication being on hold? No    XOLAIR 150 mg/mL syringe (omalizumab)  : 1 doses of medicine on hand          REFERRAL TO PHARMACIST     Referral to the pharmacist: Not needed      SHIPPING     Shipping address confirmed in Epic.       Delivery Scheduled: Yes, Expected medication delivery date: 10/01/23.     Medication will be delivered via Same Day Courier to the prescription address in Epic WAM.    Ricci Barker   [facility]  Specialty Technician

## 2023-10-01 MED FILL — XOLAIR 150 MG/ML SUBCUTANEOUS SYRINGE: SUBCUTANEOUS | 28 days supply | Qty: 2 | Fill #8

## 2023-10-19 ENCOUNTER — Ambulatory Visit: Admit: 2023-10-19 | Discharge: 2023-10-20 | Payer: MEDICAID

## 2023-10-19 DIAGNOSIS — M2550 Pain in unspecified joint: Principal | ICD-10-CM

## 2023-10-19 DIAGNOSIS — M3501 Sicca syndrome with keratoconjunctivitis: Principal | ICD-10-CM

## 2023-10-19 MED ORDER — HYDROXYCHLOROQUINE 200 MG TABLET
ORAL_TABLET | Freq: Every day | ORAL | 3 refills | 90 days | Status: CP
Start: 2023-10-19 — End: ?

## 2023-10-19 MED ORDER — PILOCARPINE 5 MG TABLET
ORAL_TABLET | Freq: Three times a day (TID) | ORAL | 3 refills | 90 days | Status: CP
Start: 2023-10-19 — End: 2024-10-18

## 2023-10-19 NOTE — Unmapped (Signed)
Last clinic visit: Nov 2023     Accompanied by: her daughter, [name], who contributed key portions of the history.     Chief complaint: follow-up Sjogren's     History of Present Illness:     HPI:  Christy Ibarra is a 67 y.o. female with a complicated past history including 10 year history of sicca symptoms, arthralgia, and gait abnormalities as well as +SSA/SSB, + ANA, + RF and lip biopsy with focal lymphocytic sialadenitis compatible with Dx Sjogren's. Also with mild leukopenia hematology feels may be due to Sjogren's. Neuro has raised the question of whether worsening of her gait was due to transverse myelitis and questioned whether that could be due to Sjogren's. Arthralgia improved on plaquenil started Feb 2021. Her mobility and strength have also gradually improved.  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     Current treatment:   - Plaquenil 200 mg daily  - Salagen 5 mg PRN    Interval History:  - Sjogren's is doing OK.   - She tells me she is having pain run from her lower back into her legs, feels like nerve pain.   - Her feet are numb, long-standing. Sometimes feet burn and tingle.   - Now on gluten free diet which has helped. Eating sugar also causes pain and inflammation from her Sjogren's. Tries to eat clean.   - Remains on Plaquenil.  - Uses Salagen as needed estimates a few times a week, also hydrates more. If does not take Salagen gets swelling in her parotid glands.  - She feels she gets a lot of fatigue from Sjogren's.   - Only has two teeth so does not see dentist.   - Has eye exam coming up in Feb.   - AM stiffness for up to an hour.   - No fevers, night sweats, LAD, or weight loss.   - Breathing now doing better on Breztri inhaler. Also taking Xolair shots for allergies at home that she reports helps her breathing.     Objective    Physical Exam:  Vitals:    10/19/23 1517   BP: 98/69 BP Position: Sitting   Pulse: 88   Temp: 36.4 ??C (97.5 ??F)   TempSrc: Temporal   Weight: 56.2 kg (124 lb)   Body mass index is 19.42 kg/m??.  GENERAL: The patient is well appearing, in no acute distress. Using cane in clinic. Mild unsteady gait in exam room.   SKIN: No rash.   EYES: PERRL. Sclera anicteric, conjunctiva non- injected.   ENT: mucus membranes moist. Edentulous except two teeth  Neck: supple, no cervical lymphadenopathy  Respiratory: Breathing non-labored, CTA bilaterally  CV: Heart rate regular, no murmurs  GI: Abdomen soft, nontender, nondistended, no hepatosplenomegaly  VASCULAR: warm and well perfused extremities, no c/c/e.  NEURO: CN 2-12 grossly intact.   PSYCH: No depression or anxiety. Cooperative. Alert and oriented.   MUSCULOSKELETAL:   Bilateral shoulders, elbows, wrists, hands, fingers:  No deformity, erythema, warmth, swelling, effusion, tenderness, or limited ROM.?? Able to curl all fingers. Prayer sign negative.   Bilateral knees, ankles, feet, toes: No deformity, erythema, warmth, swelling, effusion, tenderness, or limited ROM. MTP squeeze negative.     Assessment/Plan:     Christy Ibarra is a 67 y.o. female with a complicated past history including 10 year history of sicca symptoms, arthralgia, and gait abnormalities as well as +SSA/SSB, + ANA, +  RF and lip biopsy with focal lymphocytic sialadenitis compatible with Dx Sjogren's. Neuro has raised the question of whether worsening of her gait was due to transverse myelitis and questioned whether that could be due to Sjogren's. Pt previously reported improvement in arthralgia on plaquenil started Feb 2021. Her mobility and strength have also gradually improved.  Also following with pulm for dyspnea and abnormal PFTs, COPD and possible bronchiolitis.      Sjogren's:  +SSA/SSB, + ANA, + RF and lip biopsy with focal lymphocytic sialadenitis. Also with some arthralgia that improved somewhat after started Plaquenil. Has dyspnea, now followed by pulm for COPD and possible bronchiolitis.   - Will defer updating labs today.  - Continue Plaquenil 200 mg daily (refilled today).  - Continue Salagen 5 mg TID as needed (refilled today).   - Encouraged regular dental and ophtho appts.   - Asked her to let us or PCP know if develops B symptoms (LAD, fevers, night sweats, weight loss, etc).   - She will continue to follow with pulm.      Osteoporosis: Dexa Oct 2022 with lumbar spine T score -4.1, L hip total -4.1, femoral neck -3.5.  - Now following with endocrinology.   - Vit D 19 on 03/02/2022, pt reports now on Vitamin D.     Immunization Counseling:  Influenza Vaccine: 08/31/2023  Pneumococcal Prevnar PCV-20: 03/18/22  Covid-19 Vaccine: 08/31/2023    Long-term Plaquenil use: Patient is currently taking plaquenil which requires intensive monitoring including regular eye exams due to risk for retinal toxicity.    - Plaquenil started: Feb 2021  - Last eye exam was: July 2024 [facility] ophtho Dr. Beckey Downing without signs of plaquenil toxicity.   - Patient has the following risk factors for retinal toxicity from plaquenil: Age >60 yrs    We discussed the above including diagnosis and recommendations, agreed on the above plan, and all questions were answered.    Follow-up: Return for Follow-up Sjogren's in 12 months .    Danella Maiers, MD, MSCI  Assistant Professor of Medicine  Department of Medicine/Division of Rheumatology  [place] [facility]  [phone] clinic phone  [phone] clinic secure fax    I personally spent 20 minutes face-to-face and non-face-to-face in the care of this patient, which includes all pre, intra, and post visit time on the date of service.  All documented time was specific to the E/M visit and does not include any procedures that may have been performed.     Diagnoses and all orders for this visit:    Sjogren's syndrome with keratoconjunctivitis sicca (CMS-HCC)  -     hydroxychloroquine (PLAQUENIL) 200 mg tablet; Take 1 tablet (200 mg total) by mouth daily.  -     pilocarpine (SALAGEN) 5 MG tablet; Take 1 tablet (5 mg total) by mouth Three (3) times a day.    Polyarthralgia  -     hydroxychloroquine (PLAQUENIL) 200 mg tablet; Take 1 tablet (200 mg total) by mouth daily.

## 2023-10-20 DIAGNOSIS — B0229 Other postherpetic nervous system involvement: Principal | ICD-10-CM

## 2023-10-20 MED ORDER — PREGABALIN 25 MG CAPSULE
ORAL_CAPSULE | Freq: Two times a day (BID) | ORAL | 0 refills | 90 days | Status: CP
Start: 2023-10-20 — End: 2024-10-19
  Filled 2023-11-02: qty 180, 90d supply, fill #0

## 2023-10-20 NOTE — Unmapped (Signed)
[facility] Clinical Assessment & Refill Coordination Note    Christy Ibarra, DOB: 08/26/1956  Phone: [phone] (home)     All above HIPAA information was verified with patient.     Was a Nurse, learning disability used for this call? No    Specialty Medication(s):   CF/Pulmonary/Asthma: Xolair 150mg      Current Outpatient Medications   Medication Sig Dispense Refill    abaloparatide 80 mcg (3,120 mcg/1.56 mL) PnIj Inject 80 mcg under the skin daily. 1.56 mL 6    albuterol 2.5 mg /3 mL (0.083 %) nebulizer solution Inhale 3 mL (2.5 mg total) by nebulization every six (6) hours as needed for wheezing or shortness of breath. 360 mL 11    albuterol HFA 90 mcg/actuation inhaler Inhale 1 puff every four (4) hours as needed for wheezing. 8.5 g 11    budesonide-glycopyr-formoterol (BREZTRI AEROSPHERE) 160-9-4.8 mcg/actuation HFAA Inhale 2 puffs two (2) times a day. 32.1 g 3    cholecalciferol, vitamin D3-250 mcg, 10,000 unit,, 250 mcg (10,000 unit) capsule Take 1 capsule (250 mcg total) by mouth daily. 30 capsule 11    citalopram (CELEXA) 20 MG tablet Take 1 tablet (20 mg total) by mouth daily. 90 tablet 3    empty container (SHARPS CONTAINER) Misc Use as directed 1 each 2    empty container Misc Use as directed 1 each 3    EPINEPHrine (EPIPEN) 0.3 mg/0.3 mL injection Inject 0.3 mL (0.3 mg total) into the muscle once as needed for anaphylaxis for up to 1 dose. 2 each 1    hydroxychloroquine (PLAQUENIL) 200 mg tablet Take 1 tablet (200 mg total) by mouth daily. 90 tablet 3    ipratropium (ATROVENT) 0.02 % nebulizer solution Inhale the contents of 1 vial (500 mcg total) by nebulization Four (4) times a day. 62.5 mL 12    montelukast (SINGULAIR) 10 mg tablet Take 1 tablet (10 mg total) by mouth daily. 90 tablet 3    omalizumab (XOLAIR) 150 mg/mL syringe Inject the contents of 1 syringe (150 mg total) under the skin every fourteen (14) days. 2 mL 11    omeprazole (PRILOSEC) 20 MG capsule Take 1 capsule (20 mg total) by mouth daily. 90 capsule 3    pen needle, diabetic 32 gauge x 5/32 (4 mm) Ndle Use as directed to inject Tymlos 100 each 3    pilocarpine (SALAGEN) 5 MG tablet Take 1 tablet (5 mg total) by mouth Three (3) times a day. 270 tablet 3    pregabalin (LYRICA) 25 MG capsule Take 1 capsule (25 mg total) by mouth Two (2) times a day. 180 capsule 1    rosuvastatin (CRESTOR) 10 MG tablet Take 1 tablet (10 mg total) by mouth nightly. 90 tablet 3    traZODone (DESYREL) 50 MG tablet Take 0.5 tablets (25 mg total) by mouth nightly. 45 tablet 3     Current Facility-Administered Medications   Medication Dose Route Frequency Provider Last Rate Last Admin    sodium chloride 3 % nebulizer solution 4 mL  4 mL Nebulization Once Ellin Mayhew, MD            Changes to medications: Christy Ibarra reports no changes at this time.    Allergies   Allergen Reactions    [facility] Dust Cough       Changes to allergies: No    SPECIALTY MEDICATION ADHERENCE     Xolair 150 mg/ml: 0 doses of medicine on hand  Medication Adherence    Patient reported X missed doses in the last month: 1  Specialty Medication: Xolair 150 mg/mL Q14d  Patient is on additional specialty medications: No  Patient is on more than two specialty medications: No  Any gaps in refill history greater than 2 weeks in the last 3 months: no  Demonstrates understanding of importance of adherence: yes  Informant: patient          Specialty medication(s) dose(s) confirmed: Regimen is correct and unchanged.     Are there any concerns with adherence? No - Took one dose late when sick in early October but is back on track with dosing.     Adherence counseling provided? Not needed    CLINICAL MANAGEMENT AND INTERVENTION      Clinical Benefit Assessment:    Do you feel the medicine is effective or helping your condition? Yes    Clinical Benefit counseling provided? Progress note from 8/14 shows evidence of clinical benefit    Adverse Effects Assessment:    Are you experiencing any side effects? No    Are you experiencing difficulty administering your medicine? No    Quality of Life Assessment:    Quality of Life    Rheumatology  Oncology  Dermatology  Cystic Fibrosis          How many days over the past month did your asthma  keep you from your normal activities? For example, brushing your teeth or getting up in the morning. Christy Ibarra states she had an exacerbation 3 weeks ago but since then, doing well.     Have you discussed this with your provider? Not needed    Acute Infection Status:    Acute infections noted within Epic:  No active infections  Patient reported infection: None    Therapy Appropriateness:    Is therapy appropriate based on current medication list, adverse reactions, adherence, clinical benefit and progress toward achieving therapeutic goals? Yes, therapy is appropriate and should be continued     DISEASE/MEDICATION-SPECIFIC INFORMATION      For patients on injectable medications: Patient currently has 0 doses left.  Next injection is scheduled for 12/7.    Asthma and Allergy: Have you had an asthma exacerbation in the last 30 days? Yes, reported exacerbation on 10/8  Have you needed to use your rescue inhaler more often than usual in the last 30 days? Yes, during exacerbation.   Have you needed to take steroids for your asthma in the last 30 days? Yes, took prednisone taper in October    PATIENT SPECIFIC NEEDS     Does the patient have any physical, cognitive, or cultural barriers? No    Is the patient high risk? No    Did the patient require a clinical intervention? No    Does the patient require physician intervention or other additional services (i.e., nutrition, smoking cessation, social work)? No    SOCIAL DETERMINANTS OF HEALTH     At the [facility], we have learned that life circumstances - like trouble affording food, housing, utilities, or transportation can affect the health of many of our patients.   That is why we wanted to ask: are you currently experiencing any life circumstances that are negatively impacting your health and/or quality of life? Patient declined to answer    Social Drivers of Health     Food Insecurity: Food Insecurity Present (11/04/2022)    Hunger Vital Sign     Worried About Running Out of Food  in the Last Year: Sometimes true     Ran Out of Food in the Last Year: Sometimes true   Internet Connectivity: Not on file   Housing/Utilities: Low Risk  (11/04/2022)    Housing/Utilities     Within the past 12 months, have you ever stayed: outside, in a car, in a tent, in an overnight shelter, or temporarily in someone else's home (i.e. couch-surfing)?: No     Are you worried about losing your housing?: No     Within the past 12 months, have you been unable to get utilities (heat, electricity) when it was really needed?: No   Tobacco Use: Medium Risk (08/31/2023)    Patient History     Smoking Tobacco Use: Former     Smokeless Tobacco Use: Never     Passive Exposure: Never   Transportation Needs: No Transportation Needs (11/04/2022)    PRAPARE - Therapist, art (Medical): No     Lack of Transportation (Non-Medical): No   Alcohol Use: Not At Risk (01/15/2022)    Alcohol Use     How often do you have a drink containing alcohol?: Never     How many drinks containing alcohol do you have on a typical day when you are drinking?: 1 - 2     How often do you have 5 or more drinks on one occasion?: Never   Interpersonal Safety: Not on file   Physical Activity: Not on file   Intimate Partner Violence: Not At Risk (06/23/2021)    Humiliation, Afraid, Rape, and Kick questionnaire     Fear of Current or Ex-Partner: No     Emotionally Abused: No     Physically Abused: No     Sexually Abused: No   Stress: Not on file   Substance Use: Not on file (09/28/2023)   Social Connections: Not on file   Financial Resource Strain: Medium Risk (11/04/2022)    Overall Financial Resource Strain (CARDIA)     Difficulty of Paying Living Expenses: Somewhat hard   Depression: At risk (06/23/2021)    PHQ-2     PHQ-2 Score: 3   Health Literacy: Medium Risk (06/23/2021)    Health Literacy     : Sometimes       Would you be willing to receive help with any of the needs that you have identified today? Not applicable       SHIPPING     Specialty Medication(s) to be Shipped:   CF/Pulmonary/Asthma: Xolair 150 mg/mL    Other medication(s) to be shipped:  cholecalciferol 10,000 units, montelukast 10mg , omeprazole 20mg , rosuvastatin 10mg , pregabalin 25mg      Changes to insurance: No    Delivery Scheduled: Yes, Expected medication delivery date: 10/26/23.     Medication will be delivered via Same Day Courier to the confirmed prescription address in [facility].    The patient will receive a drug information handout for each medication shipped and additional FDA Medication Guides as required.  Verified that patient has previously received a Conservation officer, historic buildings and a Surveyor, mining.    The patient or caregiver noted above participated in the development of this care plan and knows that they can request review of or adjustments to the care plan at any time.      All of the patient's questions and concerns have been addressed.    Oliva Bustard, PharmD   [facility] Specialty Pharmacist

## 2023-10-20 NOTE — Unmapped (Signed)
Reviewed last note. Stable on this medication.  Reviewed PDMP and appropriate  Sent refill

## 2023-11-02 MED FILL — XOLAIR 150 MG/ML SUBCUTANEOUS SYRINGE: SUBCUTANEOUS | 28 days supply | Qty: 2 | Fill #9

## 2023-11-02 MED FILL — OMEPRAZOLE 20 MG CAPSULE,DELAYED RELEASE: ORAL | 90 days supply | Qty: 90 | Fill #2

## 2023-11-02 MED FILL — CHOLECALCIFEROL (VITAMIN D3) 250 MCG (10,000 UNIT) CAPSULE: ORAL | 30 days supply | Qty: 30 | Fill #6

## 2023-11-02 MED FILL — MONTELUKAST 10 MG TABLET: ORAL | 90 days supply | Qty: 90 | Fill #3

## 2023-11-02 MED FILL — ROSUVASTATIN 10 MG TABLET: ORAL | 90 days supply | Qty: 90 | Fill #2

## 2023-11-10 NOTE — Unmapped (Signed)
Internal Medicine Clinic Visit    Reason for visit: Cough    A/P:         1. Bronchiectasis with acute exacerbation (CMS-HCC)    2. Asthma-COPD overlap syndrome (CMS-HCC)        COPD-Asthma overlap   Bronchiectasis with hx of acute exacerbations  Currently established with [facility] pulmonology. Current airway clearance: Albuterol inhaler PRN, hypertonic saline, Waymon Budge, Breztri, vest physiotherapy.  Patient has been treated for 2 bronchiectasis exacerbations with Levaquin and prednisone in the past several months.  Most recently after CT chest 07/2023 with findings consistent with active infection. She reports several weeks of improvement with her most recent course of antibiotics.  However about 1 month ago had several days of sore throat and has since had persistent cough with occasional yellow sputum production in general malaise/fatigue.  Feels breathing is stable, no increased shortness of breath, chest tightness, or wheezing that she had with prior bronchiectasis exacerbations.  Has not been using her airway clearance tools over the past several days due to fatigue.  Overall symptoms concerning for potential CAP/COPD exacerbation. Will treat with 14-day course of levofloxacin and 5-day course of prednisone.  She already has repeat chest CT scheduled for 12/30, no further imaging indicated  -Start levofloxacin 750 mg daily for 14 days  -Start prednisone 40 mg daily for 5 days  - Proceed with CT chest on 12/30  -Discussed importance of consistent airway clearance and vest PT twice daily  -Advised to proceed to ED if symptoms worsen, has worsening shortness of breath or chest tightness  -Will notify her primary pulmonologist    Return if symptoms worsen or fail to improve.    Staffed with Dr. Guinevere Ferrari, seen and discussed    __________________________________________________________    HPI:  Patient coming in today with concerns for persistent cough with occasional yellow sputum production.  She has a history of bronchiectasis, COPD/asthma.  She has had 2 courses of levofloxacin and prednisone over the past several months.  After her last course in October she reported several weeks of improvement with her symptoms but then returned about a month ago.  Symptoms started off with several days of sore throat and has since had persistent cough.  No worsening shortness of breath, chest tightness, wheezing but she has had with prior bronchiectasis exacerbations.  She is using her albuterol inhaler daily, doing the Breztri twice a day but reports inconsistent usage of her other airway clearance therapies.  Has not used her vest physiotherapy in the past week secondary to fatigue.  Feels like she is able to cough up her sputum.  No fevers chills, recent sick contacts.      __________________________________________________________        Medications:  Reviewed in EPIC  __________________________________________________________    Physical Exam:   Vital Signs:  Vitals:    11/11/23 0957   BP: 126/62   BP Site: L Arm   BP Position: Sitting   BP Cuff Size: Medium   Pulse: 69   Resp: 12   Temp: 36.6 ??C (97.8 ??F)   TempSrc: Temporal   SpO2: 97%   Weight: 56.3 kg (124 lb 3.2 oz)   Height: 170.2 cm (5' 7)          PTHomeBP    The patient???s Average Home Blood Pressure during the last two weeks is :   /   based on  readings            Gen: Well appearing, NAD  CV: RRR, no murmurs  Pulm: Reduced lung sounds bilaterally, limited by poor inspiratory effort and cough.  No appreciable wheezes or crackles  Abd: Soft, NTND, normal BS.   Ext: No edema      PHQ-9 Score:  PHQ-9 TOTAL SCORE: 2  GAD-7 Score:       Medication adherence and barriers to the treatment plan have been addressed. Opportunities to optimize healthy behaviors have been discussed. Patient / caregiver voiced understanding.    Bonney Roussel, MD  Internal Medicine, PGY-1

## 2023-11-11 ENCOUNTER — Ambulatory Visit: Admit: 2023-11-11 | Discharge: 2023-11-12 | Payer: MEDICARE

## 2023-11-11 DIAGNOSIS — J471 Bronchiectasis with (acute) exacerbation: Principal | ICD-10-CM

## 2023-11-11 DIAGNOSIS — J4489 Asthma-COPD overlap syndrome (CMS-HCC): Principal | ICD-10-CM

## 2023-11-11 MED ORDER — PREDNISONE 10 MG TABLET
ORAL_TABLET | ORAL | 0 refills | 14.00 days | Status: CN
Start: 2023-11-11 — End: 2023-11-25

## 2023-11-11 MED ORDER — LEVOFLOXACIN 750 MG TABLET
ORAL_TABLET | Freq: Every day | ORAL | 0 refills | 14.00 days | Status: CP
Start: 2023-11-11 — End: 2023-11-25

## 2023-11-11 MED ORDER — PREDNISONE 20 MG TABLET
ORAL_TABLET | Freq: Every day | ORAL | 0 refills | 5.00 days | Status: CP
Start: 2023-11-11 — End: 2023-11-16

## 2023-11-11 NOTE — Unmapped (Signed)
[place] [facility] at [facility]     Reason for visit:  Cold like symptoms     Questions / Concerns that need to be addressed: has been having a bad cold since a month ago, has also been having congestion and yellowish mucus.     Screening BP- 126/62 P:69    Omron BPs (complete if screening BP has a systolic  > 130 or diastolic > 80)  BP#1 -   BP#2 -  BP#3 -    Average BP -  (please note this as a comment in vitals)     PTHomeBP     Diabetes:  Regularly checking blood sugars?: no  If yes, when? Complete log for past 7 days  Date Before Breakfast After Breakfast Before Lunch After Lunch Before Dinner After Dinner Before Bed                                                                                                                                   HCDM reviewed and updated in Epic:    We are working to make sure all of our patients??? wishes are updated in Epic and part of that is documenting a Environmental health practitioner for each patient  A Health Care Decision Maker is someone you choose who can make health care decisions for you if you are not able - who would you most want to do this for you????  is already up to date.    HCDM, First AlternateELKE, [name] - Daughter [phone]    BPAs completed:  PHQ9    Annual Screenings:   N/A  __________________________________________________________________________________________    SCREENINGS COMPLETED IN FLOWSHEETS      AUDIT       PHQ2       PHQ9  Thoughts that you would be better off dead, or of hurting yourself in some way: Not at all  PHQ-9 TOTAL SCORE: 2    GAD7       COPD Assessment       Falls Risk

## 2023-11-11 NOTE — Unmapped (Addendum)
It was a pleasure meeting you today!    I will send in a prescription for Levofloxacin for 14 days and Prednisone for 5 days.    Discussed importance of doing your airway clearance and vest therapy.    I will reach out to your lung doctors

## 2023-11-12 NOTE — Unmapped (Signed)
 I saw and evaluated the patient, participating in the key portions of the service.  I reviewed the resident???s note.  I agree with the resident???s findings and plan. Arren Laminack Vivien Presto, MD

## 2023-11-22 ENCOUNTER — Inpatient Hospital Stay: Admit: 2023-11-22 | Discharge: 2023-11-23 | Payer: MEDICARE

## 2023-11-23 NOTE — Unmapped (Signed)
Patient was notified of operational disruptions. Patient opted to: schedule their refill with understanding of potential delay until 10/1 or later. This was facilitated by pharmacy staff     [facility] Delivery Pharm[facility] Refill Coordination Note    Specialty Medication(s) to be Shipped:   CF/Pulmonary/Asthma: XOLAIR 150 mg/mL syringe (omalizumab)    Other medication(s) to be shipped:  Christy Ibarra, DOB: 1956-03-12  Phone: [phone] (home)       All above HIPAA information was verified with patient.     Was a Nurse, learning disability used for this call? No    Completed refill call assessment today to schedule patient's medication shipment from the [facility] Delivery Pharm[facility]  [phone]).  All relevant notes have been reviewed.     Specialty medication(s) and dose(s) confirmed: Regimen is correct and unchanged.   Changes to medications: Alainah reports no changes at this time.  Changes to insurance: No  New side effects reported not previously addressed with a pharmacist or physician: None reported  Questions for the pharmacist: No    Confirmed patient received a Conservation officer, historic buildings and a Surveyor, mining with first shipment. The patient will receive a drug information handout for each medication shipped and additional FDA Medication Guides as required.       DISEASE/MEDICATION-SPECIFIC INFORMATION        For patients on injectable medications: Patient currently has 0 doses left.  Next injection is scheduled for 12/02/23.    SPECIALTY MEDICATION ADHERENCE     Medication Adherence    Patient reported X missed doses in the last month: 0  Specialty Medication: XOLAIR 150 mg/mL syringe (omalizumab)  Patient is on additional specialty medications: No  Informant: patient              Were doses missed due to medication being on hold? No    XOLAIR 150 mg/mL syringe (omalizumab)  : 0 doses of medicine on hand          REFERRAL TO PHARMACIST     Referral to the pharmacist: Not needed      SHIPPING     Shipping address confirmed in Epic.       Delivery Scheduled: Yes, Expected medication delivery date: 11/29/23.     Medication will be delivered via Same Day Courier to the prescription address in Epic W[facility].    Dan Europe   [facility] Pharm[facility]  Specialty Technician

## 2023-11-23 NOTE — Unmapped (Addendum)
Pulmonology Telephone Note    Spoke with Christy Ibarra to discuss the results of their imaging. Continued small airways mucus impaction and areas of consolidation. She is feeling improved on antibiotics started 12/19, nearing the end of her course. Vest is not working - she thinks a problem with the battery. She does not recall how to reach her device nurse. Using [place] on lowest setting because higher settings make her chest feel tight. Not bringing up much sputum. Is doing nebulizer treatments.   I would advocate for inpatient admission with aggressive airway clearance and IV antibiotics if recurrent exacerbation in the next few months. I worry greatly that Christy Ibarra is not able to complete airway clearance at home and this has increased the frequency  of her exacerbations in the last 6 months.     Actions:   - will ask clinic RN/RT team to assist with her with reaching device RN for vest trouble shooting  - continue levaquin  - continue AT LEAST twice daily airway clearance, encouraged to step up [place] as tolerated    All questions answered.     Christy Stansbery E. Talmadge Coventry, MD  Pulmonary & Critical Care Fellow  Pager: [phone]

## 2023-11-29 MED FILL — XOLAIR 150 MG/ML SUBCUTANEOUS SYRINGE: SUBCUTANEOUS | 28 days supply | Qty: 2 | Fill #10

## 2023-11-29 MED FILL — BREZTRI AEROSPHERE 160 MCG-9MCG-4.8MCG/ACTUATION HFA AEROSOL INHALER: RESPIRATORY_TRACT | 90 days supply | Qty: 32.1 | Fill #1

## 2023-12-01 NOTE — Unmapped (Signed)
Called patient to see if they were able to get assistance with their [employer]est.  Patient states they called [facility] on Monday and was told that someone would get back to her.  She says that no one has reached out to her yet.  Called [employer] and spoke with Trinna Post.  Trinna Post states that they have availability next Tuesday for patient to bring in vest for service.  Asked Trinna Post if he could contact patient with appointment day and time.  Alex verbalized understanding.
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
## 2023-12-22 ENCOUNTER — Ambulatory Visit: Admit: 2023-12-22 | Discharge: 2023-12-22 | Payer: MEDICARE

## 2023-12-22 ENCOUNTER — Inpatient Hospital Stay: Admit: 2023-12-22 | Discharge: 2023-12-22 | Payer: MEDICARE

## 2024-01-03 ENCOUNTER — Ambulatory Visit: Admit: 2024-01-03 | Discharge: 2024-01-04 | Payer: MEDICARE

## 2024-01-03 DIAGNOSIS — Z79899 Other long term (current) drug therapy: Principal | ICD-10-CM

## 2024-01-03 DIAGNOSIS — M3501 Sicca syndrome with keratoconjunctivitis: Principal | ICD-10-CM

## 2024-01-03 DIAGNOSIS — H26492 Other secondary cataract, left eye: Principal | ICD-10-CM

## 2024-01-03 NOTE — Unmapped (Signed)
Ophthalmology Retina [facility]    Initial History of present illness:     Christy Ibarra is a 68 y.o. female , referred by Johnsie Cancel,* for evaluation of Plaquenil.   Ref. Lamont Dowdy, MD for Retina Evaluation / Hx of Sjogren's syndrome with keratoconjunctivitis sicca w/ Long-term use of PlaquenilStable vision, no flashes and stable floaters noticed monthly (both eyes)  Plaquenil 200mg  daily / since 2021    M35.01 (ICD-10-CM) - Sjogren's syndrome with keratoconjunctivitis sicca (CMS-HCC)  Z79.899 (ICD-10-CM) - Long-term use of Plaquenil      has a past medical history of Anxiety, Anxiety (10/09/2019), Bronchiectasis without complication (CMS-HCC) (03/18/2022), Caregiver stress (06/23/2021), Cataract, COPD (chronic obstructive pulmonary disease) (CMS-HCC), Depression, Dry eyes, Grief (11/12/2021), Joint pain, 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       Assessment and plan:     # Plaquenil use   Plaquenil use since 2021  Stable dose of 200 mg qd  Recommend baseline exam and then annual testing after 5 years of use, or sooner if risk factors are present including: Duration of use > 5 years, Cumulative dose > 1000 g, Daily dose > 400 mg/day or >5 mg/kg or short stature, elderly, kidney or liver dysfunction, other retinal disease or maculopathy or tamoxifen use    Fu today. Doing well.  No definite signs of retinopathy, retina does measure thinner than prior, could be daily variability.  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      1) Follow up 12 months , OCT, FAF   2) cornea attending or CAP for YAG left eye     # Sjogren's disease  Treated with Plaquenil  Symptoms stable  Continue follow-up with rheumatology      # Pseudophakia both eyes   stable  Monitor    # PCO both eyes  Left seems significant  Plan as above      Copy to :   Referring provider   Primary Care Provider Johnsie Cancel, MD    INTERPRETATION EXTENDED OPHTHALMOSCOPY MACULA ([address])  Macula - right: vitreous syneresis,  Normal  Macula - left:  vitreous syneresis, Normal         01/03/2024   I saw and evaluated the patient, participating in the key portions of the service.  I reviewed the resident???s note.  I agree with the resident???s findings and plan including interpretation of images.

## 2024-01-04 NOTE — Unmapped (Addendum)
Patient was notified of operational disruptions. Patient opted to: schedule their refill with understanding of potential delay until 10/1 or later. This was facilitated by pharmacy staff     [facility] Refill Coordination Note    Specialty Medication(s) to be Shipped:   CF/Pulmonary/Asthma: XOLAIR 150 mg/mL syringe (omalizumab)    Other medication(s) to be shipped: No additional medications requested for fill at this time     Christy Ibarra, DOB: 18-Feb-1956  Phone: [phone] (home)       All above HIPAA information was verified with patient.     Was a Nurse, learning disability used for this call? No    Completed refill call assessment today to schedule patient's medication shipment from the [facility]  [phone]).  All relevant notes have been reviewed.     Specialty medication(s) and dose(s) confirmed: Regimen is correct and unchanged.   Changes to medications: [name] reports no changes at this time.  Changes to insurance: No  New side effects reported not previously addressed with a pharmacist or physician: None reported  Questions for the pharmacist: No    Confirmed patient received a Conservation officer, historic buildings and a Surveyor, mining with first shipment. The patient will receive a drug information handout for each medication shipped and additional FDA Medication Guides as required.       DISEASE/MEDICATION-SPECIFIC INFORMATION        For patients on injectable medications: Patient currently has 0 doses left.  Next injection is scheduled for 01/08/24.    SPECIALTY MEDICATION ADHERENCE     Medication Adherence    Specialty Medication: XOLAIR 150 mg/mL syringe (omalizumab)  Patient is on additional specialty medications: No              Were doses missed due to medication being on hold? No    XOLAIR 150 mg/mL syringe (omalizumab)  : 0 doses of medicine on hand          REFERRAL TO PHARMACIST     Referral to the pharmacist: Not needed      SHIPPING     Shipping address confirmed in Epic.       Delivery Scheduled: Yes, Expected medication delivery date: 01/07/24.     Medication will be delivered via Same Day Courier to the prescription address in Epic WAM.    Christy Ibarra   [facility]  Specialty Technician

## 2024-01-07 DIAGNOSIS — J4489 Asthma-COPD overlap syndrome (CMS-HCC): Principal | ICD-10-CM

## 2024-01-07 MED FILL — XOLAIR 150 MG/ML SUBCUTANEOUS SYRINGE: SUBCUTANEOUS | 28 days supply | Qty: 2 | Fill #11
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
## 2024-01-10 ENCOUNTER — Ambulatory Visit: Admit: 2024-01-10 | Discharge: 2024-01-11 | Payer: MEDICARE

## 2024-01-10 ENCOUNTER — Inpatient Hospital Stay: Admit: 2024-01-10 | Discharge: 2024-01-11 | Payer: MEDICARE

## 2024-01-10 DIAGNOSIS — J470 Bronchiectasis with acute lower respiratory infection: Principal | ICD-10-CM

## 2024-01-10 MED ORDER — PREDNISONE 10 MG TABLET
ORAL_TABLET | ORAL | 0 refills | 10.00 days | Status: CP
Start: 2024-01-10 — End: 2024-01-20

## 2024-01-10 MED ADMIN — sodium chloride 3 % NEBULIZER solution 4 mL: 4 mL | RESPIRATORY_TRACT | @ 18:00:00 | Stop: 2024-01-10

## 2024-01-10 NOTE — Unmapped (Signed)
 It was a pleasure seeing you in clinic today! You were seen in clinic today by Dr. Talmadge Coventry & Dr. Glenard Haring      Here's a summary of what we discussed during your visit:  Start prednisone for flare up of your asthma-copd - 10 day course  Try shortening airway clearance with aerobika and adding another session -- pause when you feel chest tightness and do pursed lip breathing!  Aim to use vest 3x per week!  Sputum induction today  Keep up the good work with taking your medications, using your inhalers, exercising, and eating healthily      Follow up with me in 4 months!    Floyce Bujak E. Talmadge Coventry, MD  Pulmonary & Critical Care Fellow  [facility]  Phone: [phone]  Fax: [phone]    To contact your care team, you can either send a message via MyChart or contact the clinic at [phone].    How do I request medication refills?  Request a refill via [facility] (patient portal), call clinic at [phone] or have your pharmacy fax the request to [phone].    [facility]: [phone] *Pharmacy can mail medications to your home. You must call to request the medication be mailed.[facility]: [phone]  [facility]: [phone]    Here are some things you should know about contacting the [facility]:  Please be advised Epic now releases test results to MyChart as soon as they are available which means you will see your test results before I do.    The best way to reach your doctor for non-urgent matters is through MyChart. I can usually respond within two to three business day but I do not check messages after hours (evenings, weekends, and holidays) and often have other duties (inpatient hospital work, Producer, television/film/video activities, teaching) that make me unavailable.    - If you have sent a MyChart message to the clinic and have not received a response after three business days, please call our clinic at [phone] to speak to a nurse.     If you have an urgent issue that you feel needs a response the same day, you should also contact your primary care provider or be evaluated at an Urgent Care clinic.    For urgent lung issues after hours, you can call the hospital operator [phone]) and ask for the Pulmonary Fellow On Call. This doctor can provide some guidance and will send a message to your regular lung doctor the next morning.    If there is an emergency, call 911 or go to your closest emergency room.    I don't have a MyChart. Why should I get one?   - It's encrypted, so your information is secure  - It's a quick, easy way to contact the care team, manage appointments, see test results, and more!    How do I sign-up for MyChart?   - Download the MyChart app from the [employer] or [employer] and sign-up in the app  - Sign-up online at [email or web address]

## 2024-02-01 DIAGNOSIS — J4489 Asthma-COPD overlap syndrome (CMS-HCC): Principal | ICD-10-CM

## 2024-02-01 MED ORDER — XOLAIR 150 MG/ML SUBCUTANEOUS SYRINGE
SUBCUTANEOUS | 3 refills | 84.00 days | Status: CP
Start: 2024-02-01 — End: 2025-01-31
  Filled 2024-02-15: qty 2, 28d supply, fill #0

## 2024-02-01 NOTE — Unmapped (Signed)
 Pharmacy requesting refill.  Last appointment on 01/10/2024.  Next appointment not yet scheduled.  Requested Prescriptions     Pending Prescriptions Disp Refills    omalizumab (XOLAIR) 150 mg/mL syringe 2 mL 11     Sig: Inject the contents of 1 syringe (150 mg total) under the skin every fourteen (14) days.     RX routed to provider for review.

## 2024-02-02 DIAGNOSIS — J4489 Asthma-COPD overlap syndrome (CMS-HCC): Principal | ICD-10-CM

## 2024-02-08 DIAGNOSIS — B0229 Other postherpetic nervous system involvement: Principal | ICD-10-CM

## 2024-02-08 DIAGNOSIS — J4489 Asthma-COPD overlap syndrome (CMS-HCC): Principal | ICD-10-CM

## 2024-02-08 MED ORDER — PREGABALIN 25 MG CAPSULE
ORAL_CAPSULE | Freq: Two times a day (BID) | ORAL | 0 refills | 90.00 days | Status: CP
Start: 2024-02-08 — End: 2025-02-07
  Filled 2024-02-15: qty 180, 90d supply, fill #0

## 2024-02-08 MED ORDER — MONTELUKAST 10 MG TABLET
ORAL_TABLET | Freq: Every day | ORAL | 3 refills | 90.00 days | Status: CP
Start: 2024-02-08 — End: 2025-02-07
  Filled 2024-02-15: qty 90, 90d supply, fill #0

## 2024-02-08 NOTE — Unmapped (Signed)
 Pharmacy requesting refill.  Last appointment on 01/10/2024.  Next appointment not yet scheduled.  Requested Prescriptions     Pending Prescriptions Disp Refills    montelukast (SINGULAIR) 10 mg tablet 90 tablet 3     Sig: Take 1 tablet (10 mg total) by mouth daily.     RX routed to provider for review.

## 2024-02-10 NOTE — Unmapped (Signed)
 [facility] Refill Coordination Note    Specialty Medication(s) to be Shipped:   CF/Pulmonary/Asthma: Xolair    Other medication(s) to be shipped:  breztri, plaquenil, singulair, lyrica, crestor     Christy Ibarra, DOB: Apr 09, 1956  Phone: [phone] (home)       All above HIPAA information was verified with patient.     Was a Nurse, learning disability used for this call? No    Completed refill call assessment today to schedule patient's medication shipment from the [facility]  [phone]).  All relevant notes have been reviewed.     Specialty medication(s) and dose(s) confirmed: Regimen is correct and unchanged.   Changes to medications: [name] reports no changes at this time.  Changes to insurance: No  New side effects reported not previously addressed with a pharmacist or physician: None reported  Questions for the pharmacist: No    Confirmed patient received a Conservation officer, historic buildings and a Surveyor, mining with first shipment. The patient will receive a drug information handout for each medication shipped and additional FDA Medication Guides as required.       DISEASE/MEDICATION-SPECIFIC INFORMATION        For patients on injectable medications: Patient currently has 0 doses left.  Next injection is scheduled for 04/01.    SPECIALTY MEDICATION ADHERENCE     Medication Adherence    Patient reported X missed doses in the last month: 0  Specialty Medication: XOLAIR 150 mg/mL syringe (omalizumab)  Patient is on additional specialty medications: No              Were doses missed due to medication being on hold? No    XOLAIR 150 mg/mL syringe (omalizumab): 0 days of medicine on hand       REFERRAL TO PHARMACIST     Referral to the pharmacist: Not needed      [facility]     Shipping address confirmed in Epic.     Cost and Payment: Patient has a $0 copay, payment information is not required.    Delivery Scheduled: Yes, Expected medication delivery date: 02/15/24. Medication will be delivered via Same Day Courier to the prescription address in Epic WAM.    Dan Europe   [facility]  Specialty Technician

## 2024-02-15 MED FILL — BREZTRI AEROSPHERE 160 MCG-9MCG-4.8MCG/ACTUATION HFA AEROSOL INHALER: RESPIRATORY_TRACT | 90 days supply | Qty: 32.1 | Fill #2

## 2024-02-15 MED FILL — HYDROXYCHLOROQUINE 200 MG TABLET: ORAL | 90 days supply | Qty: 90 | Fill #0

## 2024-02-15 MED FILL — ROSUVASTATIN 10 MG TABLET: ORAL | 90 days supply | Qty: 90 | Fill #3

## 2024-03-08 ENCOUNTER — Ambulatory Visit
Admit: 2024-03-08 | Discharge: 2024-03-09 | Payer: Medicare (Managed Care) | Attending: Internal Medicine | Primary: Internal Medicine

## 2024-03-08 DIAGNOSIS — E785 Hyperlipidemia, unspecified: Principal | ICD-10-CM

## 2024-03-08 DIAGNOSIS — M3501 Sicca syndrome with keratoconjunctivitis: Principal | ICD-10-CM

## 2024-03-08 DIAGNOSIS — Z1231 Encounter for screening mammogram for malignant neoplasm of breast: Principal | ICD-10-CM

## 2024-03-08 DIAGNOSIS — J479 Bronchiectasis, uncomplicated: Principal | ICD-10-CM

## 2024-03-08 DIAGNOSIS — J4489 Asthma-COPD overlap syndrome: Principal | ICD-10-CM

## 2024-03-08 DIAGNOSIS — M8000XD Age-related osteoporosis with current pathological fracture, unspecified site, subsequent encounter for fracture with routine healing: Principal | ICD-10-CM

## 2024-03-08 DIAGNOSIS — Z1211 Encounter for screening for malignant neoplasm of colon: Principal | ICD-10-CM

## 2024-03-08 DIAGNOSIS — Z0000 Encounter for general adult medical examination without abnormal findings: Principal | ICD-10-CM

## 2024-03-08 DIAGNOSIS — F418 Other specified anxiety disorders: Principal | ICD-10-CM

## 2024-03-08 LAB — LIPID PANEL
CHOLESTEROL/HDL RATIO SCREEN: 3.8 (ref 1.0–4.5)
CHOLESTEROL: 178 mg/dL (ref <=200)
HDL CHOLESTEROL: 47 mg/dL (ref 40–60)
LDL CHOLESTEROL CALCULATED: 115 mg/dL — ABNORMAL HIGH (ref 40–99)
NON-HDL CHOLESTEROL: 131 mg/dL — ABNORMAL HIGH (ref 70–130)
TRIGLYCERIDES: 79 mg/dL (ref 0–150)
VLDL CHOLESTEROL CAL: 15.8 mg/dL (ref 11–41)

## 2024-03-08 NOTE — Unmapped (Addendum)
 Follows with [facility] rheumatology.  Treated with Plaquenil  and pilocarpine . UTD with eye exam (last 12/2023).

## 2024-03-08 NOTE — Unmapped (Addendum)
 Doing great with her airway clearance: duonebs, hypertonic saline, Aerobika. Vest therapy 2-3 times a week.

## 2024-03-08 NOTE — Unmapped (Addendum)
 Following with [facility] endocrinology. With previous fragility fracture (right hip fracture in 2022 s/p THA).  Been on Tymlos  since 01/2022 but stopped in December (supposed to be March 2025 per Endo notes and then have Reclast).   - Overdue for follow up; reminded to schedule   - Vitamin D  25 Hydroxy (25OH D2 + D3); Future  - Message sent to Endocrinology to facilitate follow up and restarting Tymlos   Orders:    Vitamin D  25 Hydroxy (25OH D2 + D3)

## 2024-03-08 NOTE — Unmapped (Addendum)
 Started on Crestor  10 mg daily. Prior LDL 191 mg/dL. She discontinued Crestor  two weeks ago due to heart health concerns. Explained statins' benefits in preventing cardiovascular events, especially given smoking history and inflammatory conditions.    - Restart Crestor  10 mg daily.  - Lipid Panel; Future    Orders:    Lipid Panel

## 2024-03-08 NOTE — Unmapped (Addendum)
 Call Endocrinology to set up an appointment and let them know you have been out of Tymlos  since December and were supposed to take until March but you are not sure on that.   Christy Bounds, MD  [facility] Endocrinology  Phone [phone]      Let me know if you do not hear back from me by 1 week about bridging therapy from Endocrinology ( I sent them a message today).     Restart the Crestor . This is helpful in preventing heart attacks.

## 2024-03-08 NOTE — Unmapped (Addendum)
 Treated with prednisone  in February by Pulmonology for exacerbation. Using albuterol  frequently with changes in season/pollen, but no evidence of acute exacerbation (good air movement on exam for her and does not feel as she did in February). Continue albuterol  as needed and Breztri . Also on omalizumab .

## 2024-03-08 NOTE — Unmapped (Deleted)
 Internal Medicine Clinic Visit    Reason for Visit:  Anxiety/depression, HLD  Assessment & Plan  Mixed anxiety depressive disorder         Osteoporosis with current pathological fracture with routine healing, unspecified osteoporosis type, subsequent encounter         Hyperlipidemia, unspecified hyperlipidemia type         Bronchiectasis without complication             Asthma-COPD overlap syndrome             Sjogren's syndrome with keratoconjunctivitis sicca                   Lipid panel and vitamin D  ***        Cancer screening:  Colon: Negative FIT 02/2023 ***  Cervical: done on 11/2020, no abnormalities, will repeat in 2027   Breast: Done 09/2022 ***  Lung: Quit in 2009; gets CT via Pulm     Immunizations:  - Flu shot: Given today  - Pneumonia: Pneumovax 23 given 05/2020  - Shingrix : Unable to afford it   - Tdap/Td: Due 2032  - COVID: Given today  - RSV: Done in March      Other:  - HIV screen: Non-reactive in 2022  - DEXA scan: Per Endocrinology   - HCV: Non-reactive in 2022  - Hemoglobin A1C: 5.4 in 2020  - Lipid panel: Ordered    Assessment & Plan        No follow-ups on file.    I personally spent *** minutes face-to-face and non-face-to-face in the care of this patient, which includes all pre, intra, and post visit time on the date of service.  __________________________________________________________    HPI:    68 year old with a history of Sjogren's syndrome, anxiety/depression/PTSD, leukopenia, gait instability, osteoporosis, bronchiectasis and asthma/COPD overlap who presents today for follow up.    What is she taking for osteoporosis? No endocrine appointment since 11/2022. Tymlos ..         History of Present Illness        ***  __________________________________________________________    Problem List:  Patient Active Problem List   Diagnosis    Abnormal gait    Anxiety    Arthralgia of hip    Family history of genetic disorder    Family history of malignant neoplasm of breast    Family history of malignant neoplasm of cervix    Mixed anxiety depressive disorder    Abnormal findings on esophagogastroduodenoscopy (EGD)    Scoliosis    Sjogren's syndrome with keratoconjunctivitis sicca    Leukopenia    Age-related nuclear cataract of both eyes    Osteoporosis with current pathological fracture    Bronchiectasis without complication    Asthma-COPD overlap syndrome    Esophageal dysphagia    Hyperlipidemia       Medications:  Reviewed in EPIC  __________________________________________________________    Physical Exam:   Vital Signs:  Vitals:    03/08/24 1247   BP: 90/56   BP Site: L Arm   BP Position: Sitting   BP Cuff Size: Medium   Pulse: 74   Temp: 36.7 ??C (98.1 ??F)   TempSrc: Oral   SpO2: 97%   Weight: 54.9 kg (121 lb)      Body mass index is 18.78 kg/m??.    Gen: Well appearing, NAD  CV: RRR, no murmurs  Pulm: CTA bilaterally, no crackles or wheezes  Abd: Soft, NTND, normal BS.  Ext: No  edema    Physical Exam

## 2024-03-08 NOTE — Unmapped (Addendum)
 Decreased Celexa  at last appointment as much of her mood was related to stressors taking care of her now deceased husband. Doing well on Celexa  20 mg daily with GAD 3 and PHQ 3 and wishes to continue current dose. No longer feeling like she needs trazodone  so will discontinue.

## 2024-03-08 NOTE — Unmapped (Signed)
 Internal Medicine Clinic Visit    Reason for Visit:  Physical; Anxiety/depression, HLD  Assessment & Plan  Routine general medical examination at a health care facility  Cancer screening:  Colon: Negative FIT 02/2023; ordered   Cervical: done on 11/2020, no abnormalities, will repeat in 2027   Breast: Done 09/2022; ordered  Lung: Quit in 2009; gets CT via Pulm     Immunizations:  - Flu shot: UTD  - Pneumonia: Pneumovax 23 given 05/2020  - Shingrix : Unable to afford it   - Tdap/Td: Due 2032  - COVID: Given today   - RSV: Done in March      Other:  - HIV screen: Non-reactive in 2022  - DEXA scan: Per Endocrinology   - HCV: Non-reactive in 2022  - Hemoglobin A1C: 5.4 in 2020  - Lipid panel: Ordered       Mixed anxiety depressive disorder  Decreased Celexa  at last appointment as much of her mood was related to stressors taking care of her now deceased husband. Doing well on Celexa  20 mg daily with GAD 3 and PHQ 3 and wishes to continue current dose. No longer feeling like she needs trazodone  so will discontinue.        Osteoporosis with current pathological fracture with routine healing, unspecified osteoporosis type, subsequent encounter  Following with [facility] endocrinology. With previous fragility fracture (right hip fracture in 2022 s/p THA).  Been on Tymlos  since 01/2022 but stopped in December (supposed to be March 2025 per Endo notes and then have Reclast).   - Overdue for follow up; reminded to schedule   - Vitamin D  25 Hydroxy (25OH D2 + D3); Future  - Message sent to Endocrinology to facilitate follow up and restarting Tymlos   Orders:    Vitamin D  25 Hydroxy (25OH D2 + D3)    Hyperlipidemia, unspecified hyperlipidemia type  Started on Crestor  10 mg daily. Prior LDL 191 mg/dL. She discontinued Crestor  two weeks ago due to heart health concerns. Explained statins' benefits in preventing cardiovascular events, especially given smoking history and inflammatory conditions.    - Restart Crestor  10 mg daily.  - Lipid Panel; Future    Orders:    Lipid Panel    Bronchiectasis without complication  Doing great with her airway clearance: duonebs, hypertonic saline, Aerobika. Vest therapy 2-3 times a week.        Asthma-COPD overlap syndrome  Treated with prednisone  in February by Pulmonology for exacerbation. Using albuterol  frequently with changes in season/pollen, but no evidence of acute exacerbation (good air movement on exam for her and does not feel as she did in February). Continue albuterol  as needed and Breztri . Also on omalizumab .        Sjogren's syndrome with keratoconjunctivitis sicca  Follows with [facility] rheumatology.  Treated with Plaquenil  and pilocarpine . UTD with eye exam (last 12/2023).        Screening for colon cancer  Due.   Orders:    Immunochemical Fecal Occult Blood Test (FIT), automated; Future    Encounter for screening mammogram for malignant neoplasm of breast  Overdue.   Orders:    Mammo Digital Screening W Tomo Bilateral; Future      Assessment & Plan  Follow-up  Emphasized importance of endocrinology follow-up for osteoporosis management and monitoring other conditions. Encouraged to contact me if no endocrinology response within one week.      Return in about 6 months (around 09/07/2024).    I personally spent 39 minutes face-to-face and non-face-to-face in the  care of this patient, which includes all pre, intra, and post visit time on the date of service.  __________________________________________________________    History of Present Illness  68 year old with a history of Sjogren's syndrome, anxiety/depression/PTSD, leukopenia, gait instability, osteoporosis, bronchiectasis and asthma/COPD overlap who presents today for follow up.    She recently completed a course of prednisone  for a COPD and asthma exacerbation and reports feeling better. She continues to use Breztri  twice daily and albuterol  approximately three times a day due to high pollen counts. She experiences increased shortness of breath during activities such as cleaning her shed, which requires gradual effort and assistance for heavy lifting. However, she feels way better than she did when treated for exacerbation in February.     She had an eye exam in February for her Sjogren's syndrome and reports scar tissue from previous cataract surgery, which may require laser treatment. She is not currently experiencing issues related to Plaquenil  use.    She has a history of high cholesterol with previous LDL levels of 190 mg/dL. She was on Crestor  10 mg daily but stopped taking it two weeks ago due to concerns about side effects (specifically cardiac issues). She has not experienced muscle pain but reports feeling tired, which she wonders if this is from the statin.     She stopped taking Tymlos  in December after two years of treatment for osteoporosis and has not been on any osteoporosis medication since. She was supposed to continue until March (per Endo note) and then switch to Reclast, but she has not followed up with endocrinology.    She is currently on Celexa  20 mg daily for anxiety and depression, with recent scores indicating improvement. She has reduced her trazodone  use and no longer feels the need for it. She continues to take Lyrica  25 mg twice daily for pain management, which she finds helpful in combination with physical activity and stretching. She has been enjoying herself more, getting out more to do things like thrifting.     She has received her RSV vaccine and is considering a COVID booster due to her lung disease.    Reviewed social and family history.     __________________________________________________________    Problem List:  Patient Active Problem List   Diagnosis    Abnormal gait    Anxiety    Arthralgia of hip    Family history of genetic disorder    Family history of malignant neoplasm of breast    Family history of malignant neoplasm of cervix    Mixed anxiety depressive disorder    Abnormal findings on esophagogastroduodenoscopy (EGD)    Scoliosis    Sjogren's syndrome with keratoconjunctivitis sicca    Leukopenia    Age-related nuclear cataract of both eyes    Osteoporosis with current pathological fracture    Bronchiectasis without complication    Asthma-COPD overlap syndrome    Esophageal dysphagia    Hyperlipidemia       Medications:  Reviewed in EPIC  __________________________________________________________    Physical Exam:   Vital Signs:  Vitals:    03/08/24 1247   BP: 90/56   BP Site: L Arm   BP Position: Sitting   BP Cuff Size: Medium   Pulse: 74   Temp: 36.7 ??C (98.1 ??F)   TempSrc: Oral   SpO2: 97%   Weight: 54.9 kg (121 lb)      Body mass index is 18.78 kg/m??.    Physical Exam  GENERAL: Well-appearing   CHEST: Lungs  moving air well for her. Bilateral scattered expiratory wheeze  CARDIOVASCULAR: Regular rate and rhythm, no murmurs.  EXTREMITIES: No LE edema

## 2024-03-11 LAB — VITAMIN D 25 HYDROXY: VITAMIN D, TOTAL (25OH): 29.8 ng/mL (ref 20.0–80.0)

## 2024-03-14 ENCOUNTER — Ambulatory Visit: Admit: 2024-03-14 | Discharge: 2024-03-15 | Payer: Medicare (Managed Care)

## 2024-03-14 NOTE — Unmapped (Signed)
 [facility]    Subjective   Patient ID: Christy Ibarra is a 68 y.o. female.      HPI    A 68 y.o. female referred by Christy Ibarra for YAG due to PCO both eyes  Pt c/o blurry vision left eye that comes and goes, mostly in the morning   Patient uses Systane prn both eyes   Last edited by Christy Ibarra on 03/14/2024  2:05 PM.        No current outpatient medications on file. (ANTI-ULCER PREPARATIONS)       Current Outpatient Medications (Other)   Medication Sig Dispense Refill    abaloparatide 80 mcg (3,120 mcg/1.56 mL) PnIj Inject 80 mcg under the skin daily. 1.56 mL 6    albuterol HFA 90 mcg/actuation inhaler Inhale 1 puff every four (4) hours as needed for wheezing. 8.5 g 11    budesonide-glycopyr-formoterol (BREZTRI AEROSPHERE) 160-9-4.8 mcg/actuation inhaler Inhale 2 puffs two (2) times a day. 32.1 g 3    citalopram (CELEXA) 20 MG tablet Take 1 tablet (20 mg total) by mouth daily. 90 tablet 3    empty container (SHARPS CONTAINER) Misc Use as directed 1 each 2    empty container Misc Use as directed 1 each 3    EPINEPHrine (EPIPEN) 0.3 mg/0.3 mL injection Inject 0.3 mL (0.3 mg total) into the muscle once as needed for anaphylaxis for up to 1 dose. 2 each 1    hydroxychloroquine (PLAQUENIL) 200 mg tablet Take 1 tablet (200 mg total) by mouth daily. 90 tablet 3    montelukast (SINGULAIR) 10 mg tablet Take 1 tablet (10 mg total) by mouth daily. 90 tablet 3    omalizumab (XOLAIR) 150 mg/mL syringe Inject the contents of 1 syringe (150 mg total) under the skin every fourteen (14) days. 6 mL 3    omeprazole (PRILOSEC) 20 MG capsule Take 1 capsule (20 mg total) by mouth daily. 90 capsule 3    pen needle, diabetic 32 gauge x 5/32 (4 mm) Ndle Use as directed to inject Tymlos 100 each 3    pilocarpine (SALAGEN) 5 MG tablet Take 1 tablet (5 mg total) by mouth Three (3) times a day. 270 tablet 3    pregabalin (LYRICA) 25 MG capsule Take 1 capsule (25 mg total) by mouth Two (2) times a day. 180 capsule 0    rosuvastatin (CRESTOR) 10 MG tablet Take 1 tablet (10 mg total) by mouth nightly. 90 tablet 3    albuterol 2.5 mg /3 mL (0.083 %) nebulizer solution Inhale 3 mL (2.5 mg total) by nebulization every six (6) hours as needed for wheezing or shortness of breath. 360 mL 11    ipratropium (ATROVENT) 0.02 % nebulizer solution Inhale the contents of 1 vial (500 mcg total) by nebulization Four (4) times a day. 62.5 mL 12       Objective   Base Eye Exam       Visual Acuity (Snellen - Linear)         Right Left    Dist [place] 20/25 -2 20/200    Dist ph [place]  20/100 -2              Tonometry (Icare, 2:12 PM)         Right Left    Pressure 9 10              Pupils         Shape React APD    Right Round  Brisk None    Left Round Brisk None              Neuro/Psych       Oriented x3: Yes    Mood/Affect: Normal              Dilation       Both eyes: 1% Tropicamide, 2.5% Phenylephrine  @ 2:09 PM                  Slit Lamp and Fundus Exam       External Exam         Right Left    External Normal Normal              Slit Lamp Exam         Right Left    Lids/Lashes Blepharoptosis Blepharoptosis    Conjunctiva/Sclera White and quiet, mild mucin  White and quiet, mild mucin     Cornea Clear, CCIs Clear, CCIs    Anterior Chamber Deep and quiet Deep and quiet, Deep    Iris Round and reactive Round and reactive    Lens PCIOL centered, few striae on PC PCIOL,dense PCO    Anterior Vitreous Vitreous syneresis Vitreous syneresis              Fundus Exam         Right Left    Disc Normal Normal    C/D Ratio 0.2 0.2    Macula Normal Normal    Vessels Normal Normal    Periphery Attached Attached                        No orders of the defined types were placed in this encounter.      Assessment/Plan:   #Dense PCO left eye   Yag capsulotomy    # Plaquenil  use   Had a recent evaluation in February    # Sjogren's disease  Treated with Plaquenil   Symptoms stable  Continue follow-up with rheumatology      # Pseudophakia both eyes   stable  monitor      Copy to : Referring provider   Primary Care Provider Christy Shih, MD    I discussed the above assessment and plan with the patient. She had the opportunity to ask questions, and her questions and concerns were addressed. She was reminded to call if there is any significant change or worsening in vision, or to get an evaluation, urgently if appropriate.    Donnamarie Gables MD,FICO

## 2024-03-23 NOTE — Unmapped (Signed)
 [facility] Refill Coordination Note    Specialty Medication(s) to be Shipped:   CF/Pulmonary/Asthma: Xolair    Other medication(s) to be shipped: No additional medications requested for fill at this time     Christy Ibarra, DOB: [birth year]  Phone: [phone] (home)       All above HIPAA information was verified with patient.     Was a Nurse, learning disability used for this call? No    Completed refill call assessment today to schedule patient's medication shipment from the [facility]  [phone]).  All relevant notes have been reviewed.     Specialty medication(s) and dose(s) confirmed: Regimen is correct and unchanged.   Changes to medications: Christy Ibarra reports no changes at this time.  Changes to insurance: No  New side effects reported not previously addressed with a pharmacist or physician: None reported  Questions for the pharmacist: No    Confirmed patient received a Conservation officer, historic buildings and a Surveyor, mining with first shipment. The patient will receive a drug information handout for each medication shipped and additional FDA Medication Guides as required.       DISEASE/MEDICATION-SPECIFIC INFORMATION        For patients on injectable medications: Patient currently has 0 doses left.  Next injection is scheduled for 04/05/2024.    SPECIALTY MEDICATION ADHERENCE     Medication Adherence    Patient reported X missed doses in the last month: 0  Specialty Medication: XOLAIR 150 mg/mL syringe (omalizumab)  Patient is on additional specialty medications: No              Were doses missed due to medication being on hold? No    XOLAIR 150 mg/mL syringe (omalizumab): 0 doses of medicine on hand       REFERRAL TO PHARMACIST     Referral to the pharmacist: Not needed      [facility]     Shipping address confirmed in Epic.     Cost and Payment: Patient has a $0 copay, payment information is not required.    Delivery Scheduled: Yes, Expected medication delivery date: 04/03/2024.     Medication will be delivered via Same Day Courier to the prescription address in Epic WAM.    [name]   [facility]  Specialty Technician

## 2024-04-03 MED FILL — XOLAIR 150 MG/ML SUBCUTANEOUS SYRINGE: SUBCUTANEOUS | 28 days supply | Qty: 2 | Fill #1

## 2024-04-28 DIAGNOSIS — B0229 Other postherpetic nervous system involvement: Principal | ICD-10-CM

## 2024-04-28 DIAGNOSIS — E785 Hyperlipidemia, unspecified: Principal | ICD-10-CM

## 2024-04-28 MED ORDER — ROSUVASTATIN 10 MG TABLET
ORAL_TABLET | Freq: Every evening | ORAL | 3 refills | 100.00000 days | Status: CP
Start: 2024-04-28 — End: 2025-04-28
  Filled 2024-05-02: qty 100, 100d supply, fill #0

## 2024-04-28 MED ORDER — PREGABALIN 25 MG CAPSULE
ORAL_CAPSULE | Freq: Two times a day (BID) | ORAL | 0 refills | 90.00000 days | Status: CP
Start: 2024-04-28 — End: 2025-04-28
  Filled 2024-05-10: qty 180, 90d supply, fill #0

## 2024-04-28 NOTE — Unmapped (Signed)
 [facility] Clinical Assessment & Refill Coordination Note    MONIGUE Ibarra, DOB: [birth year]  Phone: [phone] (home)     All above HIPAA information was verified with patient.     Was a Nurse, learning disability used for this call? No    Specialty Medication(s):   CF/Pulmonary/Asthma: Xolair     Current Outpatient Medications   Medication Sig Dispense Refill    abaloparatide 80 mcg (3,120 mcg/1.56 mL) PnIj Inject 80 mcg under the skin daily. 1.56 mL 6    albuterol 2.5 mg /3 mL (0.083 %) nebulizer solution Inhale 3 mL (2.5 mg total) by nebulization every six (6) hours as needed for wheezing or shortness of breath. 360 mL 11    albuterol HFA 90 mcg/actuation inhaler Inhale 1 puff every four (4) hours as needed for wheezing. 8.5 g 11    budesonide-glycopyr-formoterol (BREZTRI AEROSPHERE) 160-9-4.8 mcg/actuation inhaler Inhale 2 puffs two (2) times a day. 32.1 g 3    citalopram (CELEXA) 20 MG tablet Take 1 tablet (20 mg total) by mouth daily. 90 tablet 3    empty container (SHARPS CONTAINER) Misc Use as directed 1 each 2    empty container Misc Use as directed 1 each 3    EPINEPHrine (EPIPEN) 0.3 mg/0.3 mL injection Inject 0.3 mL (0.3 mg total) into the muscle once as needed for anaphylaxis for up to 1 dose. 2 each 1    hydroxychloroquine (PLAQUENIL) 200 mg tablet Take 1 tablet (200 mg total) by mouth daily. 90 tablet 3    ipratropium (ATROVENT) 0.02 % nebulizer solution Inhale the contents of 1 vial (500 mcg total) by nebulization Four (4) times a day. 62.5 mL 12    montelukast (SINGULAIR) 10 mg tablet Take 1 tablet (10 mg total) by mouth daily. 90 tablet 3    omalizumab (XOLAIR) 150 mg/mL syringe Inject the contents of 1 syringe (150 mg total) under the skin every fourteen (14) days. 6 mL 3    pen needle, diabetic 32 gauge x 5/32 (4 mm) Ndle Use as directed to inject Tymlos 100 each 3    pilocarpine (SALAGEN) 5 MG tablet Take 1 tablet (5 mg total) by mouth Three (3) times a day. 270 tablet 3 pregabalin (LYRICA) 25 MG capsule Take 1 capsule (25 mg total) by mouth Two (2) times a day. 180 capsule 0    rosuvastatin (CRESTOR) 10 MG tablet Take 1 tablet (10 mg total) by mouth nightly. 90 tablet 3     Current Facility-Administered Medications   Medication Dose Route Frequency Provider Last Rate Last Admin    sodium chloride 3 % nebulizer solution 4 mL  4 mL Nebulization Once Thornton, Maura E, MD            Changes to medications: Akari reports no changes at this time.    Medication list has been reviewed and updated in Epic: Yes    Allergies   Allergen Reactions    [facility] Dust Cough       Changes to allergies: No    Allergies have been reviewed and updated in Epic: Yes    SPECIALTY MEDICATION ADHERENCE     Xolair 150 mg/ml: 0 doses of medicine on hand     Medication Adherence    Patient reported X missed doses in the last month: 0  Specialty Medication: Xolair 150 mg/mL  Patient is on additional specialty medications: No  Patient is on more than two specialty medications: No  Any gaps  in refill history greater than 2 weeks in the last 3 months: no  Demonstrates understanding of importance of adherence: yes  Informant: patient          Specialty medication(s) dose(s) confirmed: Regimen is correct and unchanged.     Are there any concerns with adherence? No    Adherence counseling provided? Not needed    CLINICAL MANAGEMENT AND INTERVENTION      Clinical Benefit Assessment:    Do you feel the medicine is effective or helping your condition? Yes    Clinical Benefit counseling provided? Progress note from 2/17 shows evidence of clinical benefit    Adverse Effects Assessment:    Are you experiencing any side effects? No    Are you experiencing difficulty administering your medicine? 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 For example, brushing your teeth or getting up in the morning. Christy Ibarra states she requires a rescue inhaler when she over-exerts herself. Otherwise, symptoms remain stable.     Have you discussed this with your provider? Not needed    Acute Infection Status:    Acute infections noted within Epic:  No active infections    Patient reported infection: None    Therapy Appropriateness:    Is therapy appropriate based on current medication list, adverse reactions, adherence, clinical benefit and progress toward achieving therapeutic goals? Yes, therapy is appropriate and should be continued     Clinical Intervention:    Was an intervention completed as part of this clinical assessment? No    DISEASE/MEDICATION-SPECIFIC INFORMATION      For patients on injectable medications: Patient currently has 0 doses left.  Next injection is scheduled for 05/05/24.    Asthma/COPD: Have you had an asthma exacerbation in the last 30 days? No  Have you needed to use your rescue inhaler more often than usual in the last 30 days? Yes, but only when she over-exerts herself  Have you needed to take steroids for your asthma in the last 30 days? No    PATIENT SPECIFIC NEEDS     Does the patient have any physical, cognitive, or cultural barriers? No    Is the patient high risk? No    Does the patient require physician intervention or other additional services (i.e., nutrition, smoking cessation, social work)? No    Does the patient have an additional or emergency contact listed in their chart? Yes    SOCIAL DETERMINANTS OF HEALTH     At the [facility], we have learned that life circumstances - like trouble affording food, housing, utilities, or transportation can affect the health of many of our patients.   That is why we wanted to ask: are you currently experiencing any life circumstances that are negatively impacting your health and/or quality of life? Patient declined to answer    Social Drivers of Health     Food Insecurity: No Food Insecurity (03/08/2024)    Hunger Vital Sign     Worried About Running Out of Food in the Last Year: Never true     Ran Out of Food in the Last Year: Never true   Tobacco Use: Medium Risk (03/14/2024)    Patient History     Smoking Tobacco Use: Former  Smokeless Tobacco Use: Never     Passive Exposure: Never   Transportation Needs: No Transportation Needs (03/08/2024)    PRAPARE - Therapist, art (Medical): No     Lack of Transportation (Non-Medical): No   Alcohol Use: Not At Risk (01/15/2022)    Alcohol Use     How often do you have a drink containing alcohol?: Never     How many drinks containing alcohol do you have on a typical day when you are drinking?: 1 - 2     How often do you have 5 or more drinks on one occasion?: Never   Housing: Low Risk  (03/08/2024)    Housing     Within the past 12 months, have you ever stayed: outside, in a car, in a tent, in an overnight shelter, or temporarily in someone else's home (i.e. couch-surfing)?: No     Are you worried about losing your housing?: No   Physical Activity: Not on file   Utilities: Low Risk  (03/08/2024)    Utilities     Within the past 12 months, have you been unable to get utilities (heat, electricity) when it was really needed?: No   Stress: Not on file   Interpersonal Safety: At Risk (03/08/2024)    Interpersonal Safety     Unsafe Where You Currently Live: Yes     Physically Hurt by Anyone: No     Abused by Anyone: No   Substance Use: Not on file (09/28/2023)   Intimate Partner Violence: Not At Risk (06/23/2021)    Humiliation, Afraid, Rape, and Kick questionnaire     Fear of Current or Ex-Partner: No     Emotionally Abused: No     Physically Abused: No     Sexually Abused: No   Social Connections: Not on file   Financial Resource Strain: Medium Risk (11/04/2022)    Overall Financial Resource Strain (CARDIA)     Difficulty of Paying Living Expenses: Somewhat hard   Health Literacy: Medium Risk (06/23/2021)    Health Literacy     : Sometimes Internet Connectivity: Not on file       Would you be willing to receive help with any of the needs that you have identified today? Not applicable       SHIPPING     Specialty Medication(s) to be Shipped:   CF/Pulmonary/Asthma: Xolair    Other medication(s) to be shipped: Breztri, hydroxychloroquine, montelukast, pregabalin and rosuvastatin     Changes to insurance: No    Cost and Payment: Patient has a $0 copay, payment information is not required.    Delivery Scheduled: Yes, Expected medication delivery date: 05/02/24.     Medication will be delivered via Same Day Courier to the confirmed prescription address in [facility].    The patient will receive a drug information handout for each medication shipped and additional FDA Medication Guides as required.  Verified that patient has previously received a Conservation officer, historic buildings and a Surveyor, mining.    The patient or caregiver noted above participated in the development of this care plan and knows that they can request review of or adjustments to the care plan at any time.      All of the patient's questions and concerns have been addressed.    Joseph Nickel, PharmD   [facility] Specialty Pharmacist

## 2024-05-01 NOTE — Unmapped (Signed)
 Dear Finas Huger  Because your provider cares about your health, we're reaching out to let you know that you had a FIT  test mailed to you on 03/06/24 for colorectal cancer screening.      We realize you may have some questions, so, here is some key information about colorectal cancer screenings:  Colon cancer is a leading cause of death, that it can be treated if it is detected early.   After age 66, regular screenings are an essential part of your health care.  These screenings not only save lives, but they can also prevent colon cancer from developing in the first place.  The FOBT test does not diagnose cancer.  It tests for hidden blood in the stool.     Have you completed this test and mailed it in yet? If you have returned the test, your provider will receive the results. If you have not, please complete and return at your convenience.     If you did not receive or have misplaced the test, or if you are interested in alternative screening options, please let me know.    Thank you,    Khilee Hendricksen B Teiana Hajduk, MA   [facility]   On behalf of Buster Cash, Leighton Punches, MD  [facility]

## 2024-05-02 MED FILL — XOLAIR 150 MG/ML SUBCUTANEOUS SYRINGE: SUBCUTANEOUS | 28 days supply | Qty: 2 | Fill #2

## 2024-05-02 MED FILL — MONTELUKAST 10 MG TABLET: ORAL | 90 days supply | Qty: 90 | Fill #1

## 2024-05-02 MED FILL — BREZTRI AEROSPHERE 160 MCG-9MCG-4.8MCG/ACTUATION HFA AEROSOL INHALER: RESPIRATORY_TRACT | 90 days supply | Qty: 32.1 | Fill #3

## 2024-05-02 MED FILL — HYDROXYCHLOROQUINE 200 MG TABLET: ORAL | 90 days supply | Qty: 90 | Fill #1

## 2024-05-17 ENCOUNTER — Encounter: Admit: 2024-05-17 | Discharge: 2024-05-18 | Payer: Medicare (Managed Care)

## 2024-05-17 ENCOUNTER — Ambulatory Visit: Admit: 2024-05-17 | Discharge: 2024-05-18 | Payer: Medicare (Managed Care)

## 2024-05-17 DIAGNOSIS — M8000XD Age-related osteoporosis with current pathological fracture, unspecified site, subsequent encounter for fracture with routine healing: Principal | ICD-10-CM

## 2024-05-17 DIAGNOSIS — M810 Age-related osteoporosis without current pathological fracture: Principal | ICD-10-CM

## 2024-05-17 DIAGNOSIS — E559 Vitamin D deficiency, unspecified: Principal | ICD-10-CM

## 2024-05-17 LAB — BASIC METABOLIC PANEL
ANION GAP: 9 mmol/L (ref 5–14)
BLOOD UREA NITROGEN: 14 mg/dL (ref 9–23)
BUN / CREAT RATIO: 19
CALCIUM: 9.3 mg/dL (ref 8.7–10.4)
CHLORIDE: 104 mmol/L (ref 98–107)
CO2: 30.1 mmol/L (ref 20.0–31.0)
CREATININE: 0.72 mg/dL (ref 0.55–1.02)
EGFR CKD-EPI (2021) FEMALE: >90 mL/min/{1.73_m2} (ref >=60)
GLUCOSE RANDOM: 95 mg/dL (ref 70–179)
POTASSIUM: 3.7 mmol/L (ref 3.4–4.8)
SODIUM: 143 mmol/L (ref 135–145)

## 2024-05-17 NOTE — Unmapped (Signed)
 Encourage to take vitamin d3 5000 units every day.   Will switch to Reclast 5 mg IV once a year.  Please call radiology at [phone] to schedule your imaging studies ordered today (you are due for a repeat DXA scan).   For Infusion clinic (Reclast) call [phone]

## 2024-05-17 NOTE — Unmapped (Signed)
 Reason for follow up: severe osteoporosis.    Referring Provider: None Per Patient Referr*    Primary Care Provider: Marvis Comer Garre, MD      Assessment/Plan:       1. Severe Osteoporosis with current pathological fracture, unspecified osteoporosis type, initial encounter  2. Vitamin d  deficiency  - with previous fragility fracture, R hip fracture 06/2021 s/p THA, fall from standing height (slipped in wet floor).   - Risk factors for osteoporosis identified: Menopause, Sjogren, Hydroxychloroquine   - additional work up for secondary causes of osteoporosis: normal calcium , vitamin d , phosphorus, alk phos, renal function, TSH. No clinical signs of Cushing's. Patient does not have contraindications for PTH analog, no hypercalcemia, renal stones, cancer, bone mets or radiation. Has normal Alk Phos   - started Tymlos  01/2022- completed in 10/2023 (21 months)  - Since last visit, she completed Tymlos  in 10/2023 (about 21 months). Denies new fractures. Reports inconsistent vitamin, most recent vit d at/near goal.   - Last vit d on 02/2024 near goal at 50  Plan:  - Will switch to Reclast 5 mg IV for 2-3 doses depending on how much BMD improvement we obtain.    - Monitor vitamin d , calcium  and renal function while on therapy   - continue with vitamin d  supplementation to keep goal vitamin d  > 30 and calcium  intake/supplementation equivalent to 1000-1200 mg daily.  - Encouraged compliance with vit d 5000 units every day   - monitor DXA scan every 2 years (due 12/2023), ordered    Orders Placed This Encounter   Procedures    XR Dexa Bone Density Skeletal - [id number]    Basic Metabolic Panel         All questions were answered and patient agrees with plan.     Return in about 3 months (around 08/17/2024) for [employer], In-person.      Thank you for referring your patient to our endocrine clinic for evaluation. Please do not hesitate to contact me with any questions.     Sheree Manes, MD  [facility]  Phone [phone]  Fax [phone]    Subjective:       Christy Ibarra is a 67 y.o. female with history of Anxiety/Depression, Esophageal dysphagia, Sjogrens Syndrome, Asthma-COPD, Bronchiectasis, who is seen at the request of None Per Patient Referr* in here for follow up of severe osteoporosis. Last seen on 12/22/22    Internal hx 05/17/24    Since last visit, she completed Tymlos  in 10/2023 (about 21 months). Denies new fractures. Reports inconsistent vitamin, most recent vit d at/near goal.       Interval hx 12/22/22    Patient has been on Tymlos  since ~ 01/2022. Tolerating well   Remains on vit d 5000 units every day    Interval hx 03/02/22    Tymlos /Abaloparatide  approved, patient received training by nurse on injections. Has been injecting for about 1 months. Denies skin rash. Recent calcium  and renal function normal on 02/17/22. Reports baseline joint pain from her underlying rheumatologic condition, unchanged since tymlos  started.     She completed therapy since her hip fractures. Daughter has done home modifications to minimize falls. She ambulates with walker at home. No falls since last visit.     Comes with [place] daughter.     Initial encounter 12/02/21    Pertinent Bone health History:  Fracture: R hip fracture 06/2021 s/p THA, fall from standing height (slipped in wet floor)  Fracture in Family members: Sister has  Osteoporosis and shoulder fracture (does not know mechanism)  Previous Medication use:  none  Hyperthyroidism: denied  Gonadal Function:  Menarche: About 68 years old  Menopause: About 39s years old.   Vitamin D : Vitamin D  5000 every day   Calcium : No calcium  tablets . Almond milk 2 cups a day, yogurt 2 cups every other day.   Kidney Stones: denied  Hypercalcemia: denies  Steroid use: Reports for 5 months once, and occasionally needs steroids once every 2 years or so for short periods of time.   Changes in weight: stable  Smoking: for 25 years, stopped 2019  Alcohol:  denied  Malabsorption: no chronic diarrhea  History of early loss of primary teeth: No teeth for years, attributed to Sjogren. Started losing teeth in her 4s.   Anemia: denied  Falls in the past month: denied  Exercise:   Vegan/Vegetarian diet: denies  Rheumatoid Arthritis (or other autoimmune conditions): Sjogrens, taking hydroxychloroquine     Has history of cervical cancer s/p hysterectomy in her 40s, reports completely removed, did not need chemo or radiation.     Mobility was not optimal prior to hip fracture, since fracture she has been requiring walker or cane at home, occasionally wheelchair.     Dental work needed?: No teeth, no dentures or implants.     Family History: Denies history of fractures, hypercalcemia or kidney stones in the family. Denies family history of pituitary, hypothalamic, thyroid, parathyroid, adrenal or pancreatic tumors.    Social history:   She lives by herself, husband passed. She is able to do most ADL independently. Daughter does cleaning and laundry.       Past Medical History:   Diagnosis Date    Anxiety     Anxiety 10/09/2019    Bronchiectasis without complication    03/18/2022    Caregiver stress 06/23/2021    Cataract     COPD (chronic obstructive pulmonary disease)        Depression     Dry eyes     Grief 11/12/2021    Joint pain     Mixed anxiety depressive disorder 10/08/2015    Poor sleep pattern 06/23/2021    Scoliosis      Allergies   Allergen Reactions    [facility] Dust Cough     Social History     Socioeconomic History    Marital status: Single     Spouse name: Leonor Bring    Number of children: 2    Years of education: 12    Highest education level: High school graduate   Tobacco Use    Smoking status: Former     Current packs/day: 0.00     Average packs/day: 2.0 packs/day for 25.0 years (50.0 ttl pk-yrs)     Types: Cigarettes     Start date: 22     Quit date: 2009     Years since quitting: 16.4     Passive exposure: Never    Smokeless tobacco: Never   Vaping Use    Vaping status: Never Used   Substance and Sexual Activity    Alcohol use: Not Currently    Drug use: Never    Sexual activity: Yes     Partners: Male   Social History Narrative    Date information was obtained:  06/23/21        Born in [place]    Raised with both parents     Parents deceased: Mother died age 35 due to ovarian cancer.  Father died age 96 due to a stroke.    Siblings - an older brother and older sister        Relocated to [place]: yes   If relocated, came to [place]: year: 67   Reason:  Marriage to husband        Residential:    Who lives in the home currently?: Patient and her husband    Housing: own housing    Risk of losing current housing:  no    Homelessness: yes - 1979 -1980    Eviction: no         Food Insecurity: No currently since getting food stamps as of June '22        Transportation: car driven by family        Marital/partner status: Married to husband, Leonor Bring for 41 years. Husband is 17 yrs old.    Sexually active: no     Related arguments:  no         Children: (number and ages, living with) - Daughter age 28, Powell, and another daughter age 56, Burnard. Also has stepchildren    Grandchildren - granddaughter age 59 and grandson age 70        Education    Through what year did you complete in school: high school graduate    History of Learning Disability or Learning Difficulty: yes - difficulty processing and understanding        Work    Income/Employment/Disability: Futures trader. 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 Electrical engineer: no     Vocational Rehabilitation: no         Financial planner:  Has not served in the [employer] to Firearms: None     Social Drivers of Psychologist, prison and probation services Strain: Medium Risk (11/04/2022)    Overall Financial Resource Strain (CARDIA)     Difficulty of Paying Living Expenses: Somewhat hard   Food Insecurity: No Food Insecurity (03/08/2024) Hunger Vital Sign     Worried About Running Out of Food in the Last Year: Never true     Ran Out of Food in the Last Year: Never true   Transportation Needs: No Transportation Needs (03/08/2024)    PRAPARE - Therapist, art (Medical): No     Lack of Transportation (Non-Medical): No   Housing: Low Risk  (03/08/2024)    Housing     Within the past 12 months, have you ever stayed: outside, in a car, in a tent, in an overnight shelter, or temporarily in someone else's home (i.e. couch-surfing)?: No     Are you worried about losing your housing?: No      Past Surgical History:   Procedure Laterality Date    HYSTERECTOMY      PR BRONCHOSCOPY,DIAGNOSTIC W LAVAGE Bilateral 09/20/2020    Procedure: BRONCHOSCOPY, RIGID OR FLEXIBLE, INCLUDE FLUOROSCOPIC GUIDANCE WHEN PERFORMED; W/BRONCHIAL ALVEOLAR LAVAGE WITH MODERATE SEDATION;  Surgeon: Marcel Emi Saxon, MD;  Location: BRONCH PROCEDURE LAB [facility];  Service: Pulmonary    PR SUBTOT REMV VITREOUS,MECH VIRECTOMY Right 08/26/2020    Procedure: REMOVAL OF VITREOUS, ANTERIOR APPROACH(OPEN SKY TECHNIQUE OR LIMBAL INCISION); SUBTL REMOVAL W/MECH VITRECT;  Surgeon: Vinie Jama Schneider, MD;  Location: [facility];  Service: Ophthalmology  PR UPPER GI ENDOSCOPY,BIOPSY N/A 12/11/2021    Procedure: UGI ENDOSCOPY; WITH BIOPSY, SINGLE OR MULTIPLE;  Surgeon: Prentice CHRISTELLA Feeling, MD;  [facility]: [facility];  Service: Gastroenterology    PR UPPER GI ENDOSCOPY,BIOPSY N/A 04/07/2023    Procedure: UGI ENDOSCOPY; WITH BIOPSY, SINGLE OR MULTIPLE;  Surgeon: Filbert Rodgers BROCKS, MD;  Location: GI PROCEDURES [facility];  Service: Gastroenterology    PR XCAPSL CTRC RMVL INSJ IO LENS PROSTH W/O ECP Left 08/12/2020    Procedure: EXTRACAPSULAR CATARACT REMOVAL W/INSERTION OF INTRAOCULAR LENS PROSTHESIS, MANUAL OR MECHANICAL TECHNIQUE WITHOUT ENDOSCOPIC CYCLOPHOTOCOAGULATION;  Surgeon: Vinie Jama Schneider, MD;  [facility]: [facility];  Service: Ophthalmology    PR XCAPSL CTRC RMVL INSJ IO LENS PROSTH W/O ECP Right 08/26/2020    Procedure: EXTRACAPSULAR CATARACT REMOVAL W/INSERTION OF INTRAOCULAR LENS PROSTHESIS, MANUAL OR MECHANICAL TECHNIQUE WITHOUT ENDOSCOPIC CYCLOPHOTOCOAGULATION;  Surgeon: Vinie Jama Schneider, MD;  Location: [facility] OR [facility];  Service: Ophthalmology        Current Outpatient Medications:     abaloparatide  80 mcg (3,120 mcg/1.56 mL) PnIj, Inject 80 mcg under the skin daily., Disp: 1.56 mL, Rfl: 6    albuterol  2.5 mg /3 mL (0.083 %) nebulizer solution, Inhale 3 mL (2.5 mg total) by nebulization every six (6) hours as needed for wheezing or shortness of breath., Disp: 360 mL, Rfl: 11    albuterol  HFA 90 mcg/actuation inhaler, Inhale 1 puff every four (4) hours as needed for wheezing., Disp: 8.5 g, Rfl: 11    budesonide -glycopyr-formoterol  (BREZTRI  AEROSPHERE) 160-9-4.8 mcg/actuation inhaler, Inhale 2 puffs two (2) times a day., Disp: 32.1 g, Rfl: 3    citalopram  (CELEXA ) 20 MG tablet, Take 1 tablet (20 mg total) by mouth daily., Disp: 90 tablet, Rfl: 3    empty container (SHARPS CONTAINER) Misc, Use as directed, Disp: 1 each, Rfl: 2    empty container Misc, Use as directed, Disp: 1 each, Rfl: 3    EPINEPHrine  (EPIPEN ) 0.3 mg/0.3 mL injection, Inject 0.3 mL (0.3 mg total) into the muscle once as needed for anaphylaxis for up to 1 dose., Disp: 2 each, Rfl: 1    hydroxychloroquine  (PLAQUENIL ) 200 mg tablet, Take 1 tablet (200 mg total) by mouth daily., Disp: 90 tablet, Rfl: 3    ipratropium (ATROVENT ) 0.02 % nebulizer solution, Inhale the contents of 1 vial (500 mcg total) by nebulization Four (4) times a day., Disp: 62.5 mL, Rfl: 12    montelukast  (SINGULAIR ) 10 mg tablet, Take 1 tablet (10 mg total) by mouth daily., Disp: 90 tablet, Rfl: 3    omalizumab  (XOLAIR ) 150 mg/mL syringe, Inject the contents of 1 syringe (150 mg total) under the skin every fourteen (14) days., Disp: 6 mL, Rfl: 3    pen needle, diabetic 32 gauge x 5/32 (4 mm) Ndle, Use as directed to inject Tymlos , Disp: 100 each, Rfl: 3    pilocarpine  (SALAGEN ) 5 MG tablet, Take 1 tablet (5 mg total) by mouth Three (3) times a day., Disp: 270 tablet, Rfl: 3    pregabalin  (LYRICA ) 25 MG capsule, Take 1 capsule (25 mg total) by mouth Two (2) times a day., Disp: 180 capsule, Rfl: 0    rosuvastatin  (CRESTOR ) 10 MG tablet, Take 1 tablet (10 mg total) by mouth nightly., Disp: 100 tablet, Rfl: 3    Current Facility-Administered Medications:     sodium chloride  3 % nebulizer solution 4 mL, 4 mL, Nebulization, Once, Thornton, Maura E, MD      Review of  Systems  A 12 point review of systems was otherwise negative except as noted in the HPI.      Objective:      BP 129/72 (BP Site: R Arm, BP Position: Sitting)  - Pulse 81  - Ht 170.9 cm (5' 7.28)  - Wt 56.7 kg (125 lb)  - LMP  (LMP Unknown)  - BMI 19.41 kg/m??   Wt Readings from Last 3 Encounters:   05/17/24 56.7 kg (125 lb)   03/08/24 54.9 kg (121 lb)   01/10/24 55.3 kg (122 lb)      BMI Readings from Last 3 Encounters:   05/17/24 19.41 kg/m??   03/08/24 18.78 kg/m??   01/10/24 18.94 kg/m??     GEN: appears well, in NAD  HEENT: sclerae anicteric  NECK:  no visible neck mass or deformity  CHEST: normal breathing chest movements  NEURO: Aox3, following commands. In wheelchair  PSYCH: normal affect.  SKIN: no visible rash    Lab Review: most recent Calcium , Cr, GFR, Alk Phos, Phosphate, PTH and vitamin levels reviewed     Component      Latest Ref Rng 03/08/2024   Vitamin D  Total (25OH)      20.0 - 80.0 ng/mL 29.8        Component      Latest Ref Rng 12/22/2022   Sodium      135 - 145 mmol/L 142    Potassium      3.4 - 4.8 mmol/L 3.7    Chloride      98 - 107 mmol/L 107    CO2      20.0 - 31.0 mmol/L 30.7    Anion Gap      5 - 14 mmol/L 4 (L)    Bun      9 - 23 mg/dL 9    Creatinine      9.44 - 1.02 mg/dL 9.35    BUN/Creatinine Ratio 14    eGFR CKD-EPI (2021) Female      >=60 mL/min/1.94m2 >90    Glucose      70 - 179 mg/dL 99    Calcium       8.7 - 10.4 mg/dL 9.7    Vitamin D  Total (25OH)      20.0 - 80.0 ng/mL 20.5       Legend:  (L) Low  Component      Latest Ref Rng & Units 09/24/2020 03/25/2021 11/10/2021   TSH      0.550 - 4.780 uIU/mL  1.434    Vitamin D  Total (25OH)      20.0 - 80.0 ng/mL 29.3  25.1   Free T4      0.89 - 1.76 ng/dL        Component      Latest Ref Rng & Units 08/04/2021   Sodium      135 - 145 mmol/L 140   Potassium      3.4 - 4.8 mmol/L 4.1   Chloride      98 - 107 mmol/L 106   CO2      20.0 - 31.0 mmol/L 29.5   Anion Gap      5 - 14 mmol/L 5   Bun      9 - 23 mg/dL 14   Creatinine      9.39 - 0.80 mg/dL 9.30   BUN/Creatinine Ratio       20   eGFR CKD-EPI (2021) Female      >=60  mL/min/1.54m2 >90   Glucose      70 - 99 mg/dL 96   Calcium       8.7 - 10.4 mg/dL 9.7   Albumin      3.4 - 5.0 g/dL 3.8   Total Protein      5.7 - 8.2 g/dL 7.5   Total Bilirubin      0.3 - 1.2 mg/dL 0.5   AST      <=65 U/L 18   ALT      10 - 49 U/L <7 (L)   Alkaline Phosphatase      46 - 116 U/L 93       Radiology: most recent DXA scan reviewed and compared to previous one (if available)     DATE: 09/09/2021 2:34 PM  ACCESSION: [id number]  DICTATED: 09/09/2021 2:37 PM  INTERPRETATION LOCATION: [facility]     CLINICAL INDICATION: 68 years old Female with bone scan for c/f osteoporosis  - S72.001D - Closed fracture of right hip with routine healing, subsequent encounter       COMPARISON: None.     TECHNIQUE: Bone mineral density was assessed using the Horizon A bone densitometer.  The results of the study are expressed in bone mineral density (BMD) and interpreted using World Health Organization [facility]) criteria, per ISCD positions.     FINDINGS     Lumbar Spine       Excluded levels: none.      BMD:   0.595 (g/cm)        T score:  -4.1     Lumbar WHO classification: OSTEOPOROSIS.          Left Hip         Femoral Neck:           BMD:  0.463 (g/cm)           T score:  -3.5        Total Hip           BMD:  0.448 (g/cm)            T score:  -4.1      Hip WHO classification: OSTEOPOROSIS.

## 2024-05-24 NOTE — Unmapped (Signed)
 [facility] Refill Coordination Note    Specialty Medication(s) to be Shipped:   CF/Pulmonary/Asthma: Xolair     Other medication(s) to be shipped: No additional medications requested for fill at this time     Christy Ibarra, DOB: 1956/08/08  Phone: [phone] (home)       All above HIPAA information was verified with patient.     Was a Nurse, learning disability used for this call? No    Completed refill call assessment today to schedule patient's medication shipment from the [facility]  [phone]).  All relevant notes have been reviewed.     Specialty medication(s) and dose(s) confirmed: Regimen is correct and unchanged.   Changes to medications: [place] reports no changes at this time.  Changes to insurance: No  New side effects reported not previously addressed with a pharmacist or physician: None reported  Questions for the pharmacist: No    Confirmed patient received a Conservation officer, historic buildings and a Surveyor, mining with first shipment. The patient will receive a drug information handout for each medication shipped and additional FDA Medication Guides as required.       DISEASE/MEDICATION-SPECIFIC INFORMATION        For patients on injectable medications: Patient currently has 1 doses left.  Next injection is scheduled for 06/05/24.    SPECIALTY MEDICATION ADHERENCE     Medication Adherence    Patient reported X missed doses in the last month: 0  Specialty Medication: XOLAIR  150 mg/mL syringe (omalizumab )  Patient is on additional specialty medications: No  Patient is on more than two specialty medications: No  Any gaps in refill history greater than 2 weeks in the last 3 months: no  Demonstrates understanding of importance of adherence: yes              Were doses missed due to medication being on hold? No    XOLAIR  150  mg/ml: 14 days of medicine on hand       REFERRAL TO PHARMACIST     Referral to the pharmacist: Not needed      [facility]     Shipping address confirmed in Epic.     Cost and Payment: Patient has a $0 copay, payment information is not required.    Delivery Scheduled: Yes, Expected medication delivery date: 06/07/24.     Medication will be delivered via Same Day Courier to the prescription address in Epic WAM.    [name]   [facility]  Specialty Technician

## 2024-06-07 MED FILL — XOLAIR 150 MG/ML SUBCUTANEOUS SYRINGE: SUBCUTANEOUS | 28 days supply | Qty: 2 | Fill #3

## 2024-06-20 ENCOUNTER — Ambulatory Visit: Admission: EM | Admit: 2024-06-20 | Discharge: 2024-06-20 | Disposition: A | Discharge status: Home / Self Care

## 2024-06-20 ENCOUNTER — Encounter: Payer: Self-pay | Admitting: Emergency Medicine

## 2024-06-20 DIAGNOSIS — L03116 Cellulitis of left lower limb: Secondary | ICD-10-CM | POA: Diagnosis not present

## 2024-06-20 MED ORDER — PREDNISONE 10 MG (21) PO TBPK: ORAL_TABLET | Freq: Every day | ORAL | 0 refills | Status: AC

## 2024-06-20 MED ORDER — CEPHALEXIN 500 MG PO CAPS: 500.0000 mg | ORAL_CAPSULE | Freq: Four times a day (QID) | ORAL | 0 refills | Status: AC

## 2024-06-20 NOTE — ED Provider Notes (Signed)
 CAY RALPH PELT    CSN: [id number] Arrival date & time: 06/20/24  1118      History   Chief Complaint Chief Complaint  Patient presents with   Foot Pain   Leg Pain    HPI Shelby Ibarra is a 68 y.o. female.   Patient presents for evaluation of left foot and ankle pain, redness and swelling present for 5 days.  Experiencing malaise but denies presence of fever chills or bodyaches.  Symptoms worsening with spreading extending into the lower extremity.  Has full range of motion.  Able to bear weight.  Denies injury or trauma.  Attempted elevation which has been ineffective.    Past Medical History:  Diagnosis Date   Anxiety    Asthma    Cancer (HCC)    Cervical cancer (HCC) 1999   COPD (chronic obstructive pulmonary disease) (HCC)    Depression    History of home oxygen therapy    2l/ [place]    Patient Active Problem List   Diagnosis Date Noted   Closed fracture of right hip [facility])    Fall    Surgery, elective    Fever    Protein-calorie malnutrition, severe 06/27/2021   S/P right hip fracture 06/26/2021    Past Surgical History:  Procedure Laterality Date   TOTAL HIP ARTHROPLASTY Right 06/27/2021   Procedure: TOTAL HIP ARTHROPLASTY ANTERIOR APPROACH;  Surgeon: Leora Lynwood SAUNDERS, MD;  Location: [facility] ORS;  Service: Orthopedics;  Laterality: Right;    OB History   No obstetric history on file.      Home Medications    Prior to Admission medications   Medication Sig Start Date End Date Taking? Authorizing Provider  BREZTRI AEROSPHERE 160-9-4.8 MCG/ACT AERO inhaler Inhale 2 puffs into the lungs 2 (two) times daily. 07/07/23 08/25/24 Yes [provider]  cephALEXin  (KEFLEX ) 500 MG capsule Take 1 capsule (500 mg total) by mouth 4 (four) times daily. 06/20/24  Yes Krishon Adkison, Shelba SAUNDERS, NP  omalizumab (XOLAIR) 150 MG/ML prefilled syringe Inject 150 mg into the skin every 14 (fourteen) days. 02/01/24 01/31/25 Yes [provider]  pilocarpine  (SALAGEN ) 5 MG  tablet Take 5 mg by mouth in the morning, at noon, and at bedtime. 10/19/23 10/18/24 Yes [provider]  predniSONE  (STERAPRED UNI-PAK 21 TAB) 10 MG (21) TBPK tablet Take by mouth daily. Take 6 tabs by mouth daily  for 1 days, then 5 tabs for 1 days, then 4 tabs for 1 days, then 3 tabs for 1 days, 2 tabs for 1 days, then 1 tab by mouth daily for 1 days 06/20/24  Yes Alyaan Budzynski R, NP  rosuvastatin (CRESTOR) 10 MG tablet Take 10 mg by mouth daily. 04/28/24 04/28/25 Yes [provider]  Abaloparatide 3120 MCG/1.56ML SOPN Inject into the skin. 03/02/22   [provider]  albuterol  (VENTOLIN  HFA) 108 (90 Base) MCG/ACT inhaler Inhale 2 puffs into the lungs every 6 (six) hours as needed for wheezing or shortness of breath. 05/09/19   Siadecki, Sebastian, MD  budesonide-formoterol (SYMBICORT) 160-4.5 MCG/ACT inhaler Inhale 2 puffs into the lungs 2 (two) times daily. 03/24/21 03/29/22  [provider]  citalopram  (CELEXA ) 40 MG tablet Take 40 mg by mouth daily. 03/25/21 03/29/22  [provider]  EPINEPHrine  0.3 mg/0.3 mL IJ SOAJ injection Inject into the muscle. 03/18/22   [provider]  hydroxychloroquine (PLAQUENIL) 200 MG tablet Take 1 tablet by mouth daily. 10/22/22   [provider]  Insulin Pen Needle (EASY  TOUCH PEN NEEDLES) 32G X 5 MM MISC Use one pen needle daily to inject Tymlos. 11/18/22   [provider]  ipratropium (ATROVENT) 0.02 % nebulizer solution Inhale into the lungs. 07/06/22 07/06/23  [provider]  montelukast (SINGULAIR) 10 MG tablet Take 1 tablet by mouth daily. 09/03/21 09/03/22  [provider]  omalizumab CIPRIANO) 150 MG/ML prefilled syringe Inject into the skin. 03/18/22 03/18/23  [provider]  omeprazole (PRILOSEC) 20 MG capsule Take 1 capsule by mouth daily. 03/20/22 03/20/23  [provider]  pregabalin (LYRICA) 25 MG capsule Take by mouth. 08/17/22 08/17/23  [provider]   Sharps Container (BD SHARPS COLLECTOR) MISC See admin instructions. 02/19/22   [provider]  sodium chloride  HYPERTONIC 3 % nebulizer solution Inhale into the lungs. 03/18/22   [provider]  Tiotropium Bromide  Monohydrate 2.5 MCG/ACT AERS Inhale 1 puff into the lungs daily at 12 noon. 01/13/21 03/29/22  [provider]  traZODone (DESYREL) 50 MG tablet Take by mouth. 02/17/22   [provider]  valACYclovir  (VALTREX ) 1000 MG tablet Take 1 tablet (1,000 mg total) by mouth 3 (three) times daily. 03/29/22   Corlis Burnard DEL, NP    Family History Family History  Problem Relation Age of Onset   Breast cancer Mother 1       Deceased   Ovarian cancer Mother 58   Breast cancer Maternal Grandmother        approximately 18's    Social History Social History   Tobacco Use   Smoking status: Former    Current packs/day: 0.00    Average packs/day: 2.0 packs/day for 25.0 years (50.0 ttl pk-yrs)    Types: Cigarettes    Start date: 10/22/1984    Quit date: 10/22/2009    Years since quitting: 14.6   Smokeless tobacco: Never  Vaping Use   Vaping status: Never Used  Substance Use Topics   Alcohol use: No    Alcohol/week: 0.0 standard drinks of alcohol   Drug use: No     Allergies   Patient has no known allergies.   Review of Systems Review of Systems   Physical Exam Triage Vital Signs ED Triage Vitals  Encounter Vitals Group     BP 06/20/24 1125 118/73     Girls Systolic BP Percentile --      Girls Diastolic BP Percentile --      Boys Systolic BP Percentile --      Boys Diastolic BP Percentile --      Pulse Rate 06/20/24 1125 80     Resp 06/20/24 1125 18     Temp 06/20/24 1125 98.6 F (37 C)     Temp Source 06/20/24 1125 Oral     SpO2 06/20/24 1125 96 %     Weight --      Height --      Head Circumference --      Peak Flow --      Pain Score 06/20/24 1128 7     Pain Loc --      Pain Education --      Exclude from Growth Chart --     No data found.  Updated Vital Signs BP 118/73 (BP Location: Left Arm)   Pulse 80   Temp 98.6 F (37 C) (Oral)   Resp 18   LMP 10/22/1998   SpO2 96%   Visual Acuity Right Eye Distance:   Left Eye Distance:   Bilateral Distance:  Right Eye Near:   Left Eye Near:    Bilateral Near:     Physical Exam Constitutional:      Appearance: Normal appearance.  Eyes:     Extraocular Movements: Extraocular movements intact.  Pulmonary:     Effort: Pulmonary effort is normal.  Skin:    Comments: Defer to photo  Generalized swelling to the left lower extremity beginning midway and extending towards the foot and ankle, 1+ pitting edema, erythema beginning at the midfoot extending to all 5 toes, skin hot to touch generalized tenderness to the left foot, 2+ dorsalis pedis and pedal pulse, sensation intact, capillary refill less than 3  Neurological:     Mental Status: She is alert and oriented to person, place, and time.      UC Treatments / Results  Labs (all labs ordered are listed, but only abnormal results are displayed) Labs Reviewed - No data to display  EKG   Radiology No results found.  Procedures Procedures (including critical care time)  Medications Ordered in UC Medications - No data to display  Initial Impression / Assessment and Plan / UC Course  I have reviewed the triage vital signs and the nursing notes.  Pertinent labs & imaging results that were available during my care of the patient were reviewed by me and considered in my medical decision making (see chart for details).  Cellulitis of left foot  Presentation concerning for infection, has injury therefore low suspicion for fracture, deferring imaging, vitals are stable, patient showing no signs of sepsis, stable for outpatient management, prescribed cephalexin  as well as prednisone  for additional supportive measures, recommended nonpharmacological support with elevation, advised PCP follow-up for  recheck and given strict ER precautions Final Clinical Impressions(s) / UC Diagnoses   Final diagnoses:  Cellulitis of left foot     Discharge Instructions      Your foot was evaluated for redness pain and swelling which I do believe is consistent with infection as there was no injury  You have been started on antibiotics, take cephalexin  every 6 hours for 5 days  To help reduce swelling and pain begin prednisone  every morning with food as directed, avoid use of ibuprofen during treatment but may use Tylenol  or any topical medicines  May continue to elevate whenever sitting and lying  Please schedule follow-up appoint with your primary doctor for foot to be rechecked  At any point if symptoms worsen please go to the nearest emergency department for evaluation   ED Prescriptions     Medication Sig Dispense Auth. Provider   cephALEXin  (KEFLEX ) 500 MG capsule Take 1 capsule (500 mg total) by mouth 4 (four) times daily. 20 capsule Joletta Manner R, NP   predniSONE  (STERAPRED UNI-PAK 21 TAB) 10 MG (21) TBPK tablet Take by mouth daily. Take 6 tabs by mouth daily  for 1 days, then 5 tabs for 1 days, then 4 tabs for 1 days, then 3 tabs for 1 days, 2 tabs for 1 days, then 1 tab by mouth daily for 1 days 21 tablet Adisson Deak, Shelba SAUNDERS, NP      PDMP not reviewed this encounter.   Teresa Shelba SAUNDERS, NP 06/20/24 1154

## 2024-06-20 NOTE — Discharge Instructions (Signed)
 Your foot was evaluated for redness pain and swelling which I do believe is consistent with infection as there was no injury  You have been started on antibiotics, take cephalexin  every 6 hours for 5 days  To help reduce swelling and pain begin prednisone  every morning with food as directed, avoid use of ibuprofen during treatment but may use Tylenol  or any topical medicines  May continue to elevate whenever sitting and lying  Please schedule follow-up appoint with your primary doctor for foot to be rechecked  At any point if symptoms worsen please go to the nearest emergency department for evaluation

## 2024-06-20 NOTE — ED Triage Notes (Signed)
 Patient reports pain, redness, swelling in left foot that radiates up leg x 5 days. Denies injury. Has taken anything for symptoms. Rates pain 7/10.

## 2024-06-28 NOTE — Unmapped (Signed)
 [facility] Refill Coordination Note    Specialty Medication(s) to be Shipped:   CF/Pulmonary/Asthma: Xolair     Other medication(s) to be shipped: No additional medications requested for fill at this time     Christy Ibarra, DOB: 30-Aug-1956  Phone: [phone] (home)       All above HIPAA information was verified with patient.     Was a Nurse, learning disability used for this call? No    Completed refill call assessment today to schedule patient's medication shipment from the [facility]  [phone]).  All relevant notes have been reviewed.     Specialty medication(s) and dose(s) confirmed: Regimen is correct and unchanged.   Changes to medications: Arali reports no changes at this time.  Changes to insurance: No  New side effects reported not previously addressed with a pharmacist or physician: None reported  Questions for the pharmacist: No    Confirmed patient received a Conservation officer, historic buildings and a Surveyor, mining with first shipment. The patient will receive a drug information handout for each medication shipped and additional FDA Medication Guides as required.       DISEASE/MEDICATION-SPECIFIC INFORMATION        For patients on injectable medications: Patient currently has 1 doses left.  Next injection is scheduled for 07/06/24.    SPECIALTY MEDICATION ADHERENCE     Medication Adherence    Patient reported X missed doses in the last month: 0  Specialty Medication: XOLAIR  150 mg/mL syringe (omalizumab )  Patient is on additional specialty medications: No  Patient is on more than two specialty medications: No  Any gaps in refill history greater than 2 weeks in the last 3 months: no  Demonstrates understanding of importance of adherence: yes              Were doses missed due to medication being on hold? No    XOLAIR  150 mg/ml: 14 days of medicine on hand       REFERRAL TO PHARMACIST     Referral to the pharmacist: Not needed      [facility]     Shipping address confirmed in Epic.     Cost and Payment: Patient has a $0 copay, payment information is not required.    Delivery Scheduled: Yes, Expected medication delivery date: 07/05/24.     Medication will be delivered via Same Day Courier to the prescription address in Epic WAM.    [name]   [facility]  Specialty Technician

## 2024-07-05 MED FILL — XOLAIR 150 MG/ML SUBCUTANEOUS SYRINGE: SUBCUTANEOUS | 28 days supply | Qty: 2 | Fill #4

## 2024-07-11 ENCOUNTER — Inpatient Hospital Stay: Admit: 2024-07-11 | Discharge: 2024-07-11 | Payer: Medicaid (Managed Care)

## 2024-07-18 NOTE — Unmapped (Signed)
 Assessment and Plan    Christy Ibarra was seen today for LLE swelling and pain.     Diagnoses and all orders for this visit:    Pain and swelling of lower extremity, left  Differential includes DVT vs chronic venous insufficiency vs lymphedema. Given asymmetry of swelling, will evaluate for clot further with PVL ultrasound. Encouraged patient to continue with elevation.   -     PVL Venous Duplex Lower Extremity Left; Future    Shortness of breath  Mildly increased over the last few weeks coinciding with her lower extremity edema. Will eval her heart function with Echo. Encouraged continued use of inhalers for COPD.   -     Echocardiogram W Colorflow Spectral Doppler; Future  -     PVL Venous Duplex Lower Extremity Left; Future      Follow up with PCP       Reason for Visit:     Subjective    Christy Ibarra is a 68 y.o. year old female  who presents for a follow up visit.    History of Present Illness  Christy Ibarra is a 68 year old female with chronic venous insufficiency and COPD who presents with persistent swelling and pain in the left leg.    She has been experiencing persistent swelling in both feet for the past few months, which is worse on the left. Two weeks ago, she had cellulitis in the left leg, which was treated with antibiotics, but the swelling and pain to the left leg have not resolved. The swelling is persistent, with episodes of improvement and recurrence, and is severe enough to prevent her from wearing socks or shoes comfortably on her LLE. She has attempted elevation and ice application without significant relief.    The pain is described as a consistent soreness, primarily in the left foot and ankle, and there is no pain to her right leg. The pain does not improve with rest or elevation. She denies any recent injuries to the leg but reports that her feet feel cold, sometimes requiring double socks for warmth. The pain limits her activity, causing her to stay inactive most days.    She reports some mildly increased dyspnea, which she attributes to her COPD, with mild wheezing and coughing, although her oxygen levels have remained stable. She has been using a reclined position to sleep (this is baseline for her) and has not experienced orthopnea or paroxysmal nocturnal dyspnea.    No recent fevers, chills, or new medications that could contribute to her symptoms. She denies fever, chills, orthopnea, paroxysmal nocturnal dyspnea, palpitations, unintentional weight loss or gain.      Meds and allergies were reviewed in Epic    Objective    Physical Exam  Gen: Well appearing, No distress  Vitals:    07/19/24 0929   BP: 104/62   BP Site: L Arm   BP Position: Sitting   BP Cuff Size: Medium   Pulse: 80   Resp: 18   Temp: 36.3 ??C (97.3 ??F)   TempSrc: Temporal   SpO2: 95%   Weight: 58.6 kg (129 lb 3.2 oz)   Height: 170.2 cm (5' 7)     CV: RRR, no mrg  Pulm: CTAB, nl work of breathing  Ext: LLE with 1+ pitting edema, tenderness to palpation over the shin, 2+ pulses bilaterally. LLE greater than RLE. Bilateral purple discoloration of her feet which is baseline   Skin: warm and dry    Records review:

## 2024-07-19 DIAGNOSIS — M7989 Other specified soft tissue disorders: Principal | ICD-10-CM

## 2024-07-19 DIAGNOSIS — R0602 Shortness of breath: Principal | ICD-10-CM

## 2024-07-19 DIAGNOSIS — J4489 Asthma-COPD overlap syndrome: Principal | ICD-10-CM

## 2024-07-19 DIAGNOSIS — M79605 Pain in left leg: Principal | ICD-10-CM

## 2024-07-19 NOTE — Unmapped (Signed)
 [facility]     Type of visit: face to face    Are you located in [place]? (for virtual visits only)     Reason for visit: Acute care visit    Questions / Concerns that need to be addressed: See visit info    Screening BP- 104/62 80        HCDM reviewed and updated in Epic:    We are working to make sure all of our patients??? wishes are updated in Epic and part of that is documenting a Environmental health practitioner for each patient  A Health Care Decision Maker is someone you choose who can make health care decisions for you if you are not able - who would you most want to do this for you????  is already up to date.    HCDM, First AlternateKEARAH, Christy Ibarra - Daughter - [phone]

## 2024-07-19 NOTE — Unmapped (Addendum)
 It was a pleasure seeing you in clinic today. We have made the following plan.     -- You will get an ultrasound of your left leg done at [facility] on 8/27 @ 9am   -- Please reach out if your symptoms get worse or fail to improve   -- Continue elevating your feet     Otherwise, we look forward to seeing you in clinic at your next appointment!

## 2024-07-20 ENCOUNTER — Inpatient Hospital Stay: Admit: 2024-07-20 | Discharge: 2024-07-20 | Payer: Medicare (Managed Care)

## 2024-07-20 DIAGNOSIS — I82562 Chronic embolism and thrombosis of left calf muscular vein: Principal | ICD-10-CM

## 2024-07-20 MED ORDER — ELIQUIS DVT-PE TREATMENT 30-DAY STARTER 5 MG (74 TABLETS) IN DOSE PACK
ORAL_TABLET | Freq: Two times a day (BID) | ORAL | 0 refills | 30.00000 days | Status: CP
Start: 2024-07-20 — End: 2024-08-19

## 2024-07-20 NOTE — Unmapped (Signed)
 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 Discussed that this is likely the cause of her LLE swelling and pain. She will need to start on blood thinners and explained that Apixaban  will be the first choice. She has no history of GI bleeding or any other bleeding issues. Her kidney function is appropriate based on BMP from 05/17/24.   -- START Apixaban  10mg  BID x 7 days FOLLOWED by Apixaban  5 mg BID for at least 3 months   -- Encouraged patient to complete her cancer screenings. She was given a colo guard kit in April and Mammogram ordered by her PCP.   -- Counseled her to monitor any signs of bleeding following start of apixaban  and to expect easy bruising during treatment.

## 2024-07-21 ENCOUNTER — Inpatient Hospital Stay: Admit: 2024-07-21 | Discharge: 2024-07-21 | Payer: Medicare (Managed Care)

## 2024-07-21 NOTE — Unmapped (Signed)
 Addended by: Phylisha Dix N on: 07/21/2024 05:03 PM     Modules accepted: Orders

## 2024-07-21 NOTE — Unmapped (Signed)
 Spoke with Hematology regarding PVL. 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 There didn't seem to be any acute portion of the clot. 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 No cancer history. 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 Compression stocking on the left leg recommended.     Called Christy Ibarra to update her on this and cancelled the apixaban  (she will call the pharmacy to update them). She is to schedule an appointment should the swelling continue to worsen.

## 2024-07-27 NOTE — Unmapped (Signed)
 [facility] Refill Coordination Note    Specialty Medication(s) to be Shipped:   CF/Pulmonary/Asthma: Xolair     Other medication(s) to be shipped: No additional medications requested for fill at this time    Specialty Medications not needed at this time: N/A     Christy Ibarra, DOB: 13-May-1956  Phone: [phone] (home)       All above HIPAA information was verified with patient.     Was a Nurse, learning disability used for this call? No    Completed refill call assessment today to schedule patient's medication shipment from the [facility]  [phone]).  All relevant notes have been reviewed.     Specialty medication(s) and dose(s) confirmed: Regimen is correct and unchanged.   Changes to medications: Christy Ibarra reports no changes at this time.  Changes to insurance: No  New side effects reported not previously addressed with a pharmacist or physician: None reported  Questions for the pharmacist: No    Confirmed patient received a Conservation officer, historic buildings and a Surveyor, mining with first shipment. The patient will receive a drug information handout for each medication shipped and additional FDA Medication Guides as required.       DISEASE/MEDICATION-SPECIFIC INFORMATION        For patients on injectable medications: Next injection is scheduled for 07/28/24.    SPECIALTY MEDICATION ADHERENCE     Medication Adherence    Patient reported X missed doses in the last month: 0  Specialty Medication: XOLAIR  150 mg/mL syringe (omalizumab )  Patient is on additional specialty medications: No  Patient is on more than two specialty medications: No  Any gaps in refill history greater than 2 weeks in the last 3 months: no  Demonstrates understanding of importance of adherence: yes              Were doses missed due to medication being on hold? No    XOLAIR  150  mg/ml: 14 days of medicine on hand       REFERRAL TO PHARMACIST     Referral to the pharmacist: Not needed      [facility] Shipping address confirmed in Epic.     Cost and Payment: Patient has a $0 copay, payment information is not required.    Delivery Scheduled: Yes, Expected medication delivery date: 08/02/24.     Medication will be delivered via Same Day Courier to the prescription address in Epic WAM.    Christy Ibarra   [facility]  Specialty Technician

## 2024-08-02 MED FILL — XOLAIR 150 MG/ML SUBCUTANEOUS SYRINGE: SUBCUTANEOUS | 28 days supply | Qty: 2 | Fill #5

## 2024-08-15 ENCOUNTER — Encounter: Admit: 2024-08-15 | Discharge: 2024-08-16 | Payer: Medicare (Managed Care)

## 2024-08-15 MED ADMIN — zoledronic acid-mannitol&water (RECLAST) 5 mg/100 mL infusion 5 mg: 5 mg | INTRAVENOUS | @ 14:00:00 | Stop: 2024-08-15

## 2024-08-15 NOTE — Unmapped (Signed)
 Patient presents for first reclast  infusion in wheelchair, family present. Patient able to transfer into recliner chair independently. Patient denies any current or recent infection. VSS. Medications and allergies reviewed and updated. Patient educated on reclast  actions, common side effects, importance of drinking extra fluids for the next 48 hrs, no major dental procedres for next few months, importance of supplementing with vitamin D  and calcium  per MD orders. She verbalized understanding of education.   PIV inserted to left posterior forearm, positive blood return, line flushed with NS, she tolerated well    Lab results from 05/17/24  Calcium -9.3  eGFR->90    0954 Reclast  initiated per orders to run over 15 min    1014 Reclast  completed   Patient tolerated infusion with no adverse reactions, VSS, PIV flushed with NS and capped for post observation.  1045 tolerated observation with no adverse reactions. PIV removed. Site covered with gauze/coban.  Patient discharged from [facility].

## 2024-08-23 NOTE — Unmapped (Signed)
 [facility] Refill Coordination Note    Specialty Medication(s) to be Shipped:   CF/Pulmonary/Asthma: Xolair     Other medication(s) to be shipped: No additional medications requested for fill at this time    Specialty Medications not needed at this time: N/A     Christy Ibarra, DOB: 05-06-1956  Phone: [phone] (home)       All above HIPAA information was verified with patient.     Was a Nurse, learning disability used for this call? No    Completed refill call assessment today to schedule patient's medication shipment from the [facility]  [phone]).  All relevant notes have been reviewed.     Specialty medication(s) and dose(s) confirmed: Regimen is correct and unchanged.   Changes to medications: [place]ash[place] reports no changes at this time.  Changes to insurance: No  New side effects reported not previously addressed with a pharmacist or physician: None reported  Questions for the pharmacist: No    Confirmed patient received a Conservation officer, historic buildings and a Surveyor, mining with first shipment. The patient will receive a drug information handout for each medication shipped and additional FDA Medication Guides as required.       DISEASE/MEDICATION-SPECIFIC INFORMATION        For patients on injectable medications: Next injection is scheduled for 08/30/24.    SPECIALTY MEDICATION ADHERENCE     Medication Adherence    Patient reported X missed doses in the last month: 0  Specialty Medication: XOLAIR  150 mg/mL syringe (omalizumab )  Patient is on additional specialty medications: No  Patient is on more than two specialty medications: No  Any gaps in refill history greater than 2 weeks in the last 3 months: no  Demonstrates understanding of importance of adherence: yes              Were doses missed due to medication being on hold? No    XOLAIR  150 mg/ml: 14 days of medicine on hand       REFERRAL TO PHARMACIST     Referral to the pharmacist: Not needed      [facility] Shipping address confirmed in Epic.     Cost and Payment: Patient has a $0 copay, payment information is not required.    Delivery Scheduled: Yes, Expected medication delivery date: 08/31/24.     Medication will be delivered via Same Day Courier to the prescription address in Epic WAM.    [name]   [facility]  Specialty Technician

## 2024-08-29 ENCOUNTER — Ambulatory Visit: Admit: 2024-08-29 | Discharge: 2024-08-30 | Payer: Medicare (Managed Care)

## 2024-08-29 DIAGNOSIS — E559 Vitamin D deficiency, unspecified: Principal | ICD-10-CM

## 2024-08-29 DIAGNOSIS — M8000XD Age-related osteoporosis with current pathological fracture, unspecified site, subsequent encounter for fracture with routine healing: Principal | ICD-10-CM

## 2024-08-29 DIAGNOSIS — M810 Age-related osteoporosis without current pathological fracture: Principal | ICD-10-CM

## 2024-08-29 LAB — CALCIUM: CALCIUM: 8.9 mg/dL (ref 8.7–10.4)

## 2024-08-29 LAB — CREATININE
CREATININE: 0.69 mg/dL (ref 0.55–1.02)
EGFR CKD-EPI (2021) FEMALE: >90 mL/min/1.73m2 (ref >=60)

## 2024-08-29 NOTE — Unmapped (Signed)
 Reason for follow up: severe osteoporosis.    Referring Provider: None Per Patient Referr*    Primary Care Provider: Marvis Comer Garre, MD      Assessment/Plan:       1. Severe Osteoporosis with current pathological fracture, unspecified osteoporosis type, initial encounter  2. Vitamin d  deficiency  - with previous fragility fracture, R hip fracture 06/2021 s/p THA, fall from standing height (slipped in wet floor).   - Risk factors for osteoporosis identified: Menopause, Sjogren, Hydroxychloroquine   - additional work up for secondary causes of osteoporosis: normal calcium , vitamin d , phosphorus, alk phos, renal function, TSH. No clinical signs of Cushing's. Patient does not have contraindications for PTH analog, no hypercalcemia, renal stones, cancer, bone mets or radiation. Has normal Alk Phos   - Tx history:  Started Tymlos  01/2022- completed in 10/2023 (21 months)  Reclast  5 mg IV 07/2024  - Denies new fractures. Reports inconsistent vitamin, most recent vit d at/near goal.   - Last vit d on 02/2024 near goal at 2  - Last GFR and Ca normal as of 04/2024  - DXA 07/11/24 severe osteoporosis in LS and hip with some decline from 2022. It seems she either did not achieve significant improvement from PTH analog or had decline from completing therapy to the time she received anti-resorptive therapy. Likely contributing factors include external non-modifiable factors making it hard for the medication to be as effective, in addition to vitamin d  deficiency throughout the time you were on the medication   Plan:  - Will plan for [place] in/around 07/2025.   - Monitor vitamin d , calcium  and renal function while on therapy   - Continue with vitamin d  supplementation to keep goal vitamin d  > 30 and calcium  intake/supplementation equivalent to 1000-1200 mg daily.  - Encouraged compliance with vit d 5000 units every day   - Monitor DXA scan every 2 years     Orders Placed This Encounter   Procedures    Vitamin D  25 hydroxy Creatinine    Calcium        All questions were answered and patient agrees with plan.     Return in about 10 months (around 06/29/2025) for [employer], In-person.      Thank you for referring your patient to our endocrine clinic for evaluation. Please do not hesitate to contact me with any questions.     Sheree Manes, MD  [facility]  Phone [phone]  Fax [phone]    Subjective:       Christy Ibarra is a 68 y.o. female with history of Anxiety/Depression, Esophageal dysphagia, Sjogrens Syndrome, Asthma-COPD, Bronchiectasis, who is seen at the request of None Per Patient Referr* in here for follow up of severe osteoporosis. Last seen on 12/22/22    Internal hx 05/17/24    Since last visit, she completed Tymlos  in 10/2023 (about 21 months). Denies new fractures. Reports inconsistent vitamin, most recent vit d at/near goal.       Interval hx 12/22/22    Patient has been on Tymlos  since ~ 01/2022. Tolerating well   Remains on vit d 5000 units every day    Interval hx 03/02/22    Tymlos /Abaloparatide  approved, patient received training by nurse on injections. Has been injecting for about 1 months. Denies skin rash. Recent calcium  and renal function normal on 02/17/22. Reports baseline joint pain from her underlying rheumatologic condition, unchanged since tymlos  started.     She completed therapy since her hip fractures. Daughter has done home modifications  to minimize falls. She ambulates with walker at home. No falls since last visit.     Comes with [place] daughter.     Initial encounter 12/02/21    Pertinent Bone health History:  Fracture: R hip fracture 06/2021 s/p THA, fall from standing height (slipped in wet floor)  Fracture in Family members: Sister has Osteoporosis and shoulder fracture (does not know mechanism)  Previous Medication use:  none  Hyperthyroidism: denied  Gonadal Function:  Menarche: About 68 years old  Menopause: About 46s years old.   Vitamin D : Vitamin D  5000 every day   Calcium : No calcium  tablets . Almond milk 2 cups a day, yogurt 2 cups every other day.   Kidney Stones: denied  Hypercalcemia: denies  Steroid use: Reports for 5 months once, and occasionally needs steroids once every 2 years or so for short periods of time.   Changes in weight: stable  Smoking: for 25 years, stopped 2019  Alcohol:  denied  Malabsorption: no chronic diarrhea  History of early loss of primary teeth: No teeth for years, attributed to Sjogren. Started losing teeth in her 81s.   Anemia: denied  Falls in the past month: denied  Exercise:   Vegan/Vegetarian diet: denies  Rheumatoid Arthritis (or other autoimmune conditions): Sjogrens, taking hydroxychloroquine     Has history of cervical cancer s/p hysterectomy in her 40s, reports completely removed, did not need chemo or radiation.     Mobility was not optimal prior to hip fracture, since fracture she has been requiring walker or cane at home, occasionally wheelchair.     Dental work needed?: No teeth, no dentures or implants.     Family History: Denies history of fractures, hypercalcemia or kidney stones in the family. Denies family history of pituitary, hypothalamic, thyroid, parathyroid, adrenal or pancreatic tumors.    Social history:   She lives by herself, husband passed. She is able to do most ADL independently. Daughter does cleaning and laundry.       Past Medical History:   Diagnosis Date    Anxiety     Anxiety 10/09/2019    Bronchiectasis without complication    (CMS-HCC) 03/18/2022    Caregiver stress 06/23/2021    Cataract     COPD (chronic obstructive pulmonary disease) (CMS-HCC)     Depression     Dry eyes     Grief 11/12/2021    Joint pain     Mixed anxiety depressive disorder 10/08/2015    Poor sleep pattern 06/23/2021    Scoliosis      Allergies   Allergen Reactions    [facility] Dust Cough     Social History     Socioeconomic History    Marital status: Single     Spouse name: Leonor Bring    Number of children: 2    Years of education: 12 Highest education level: High school graduate   Tobacco Use    Smoking status: Former     Current packs/day: 0.00     Average packs/day: 2.0 packs/day for 25.0 years (50.0 ttl pk-yrs)     Types: Cigarettes     Start date: 85     Quit date: 2009     Years since quitting: 16.7     Passive exposure: Never    Smokeless tobacco: Never   Vaping Use    Vaping status: Never Used   Substance and Sexual Activity    Alcohol use: Not Currently    Drug use: Never  Sexual activity: Yes     Partners: Male   Social History Narrative    Date information was obtained:  06/23/21        Born in [place]    Raised with both parents     Parents deceased: Mother died age 18 due to ovarian cancer. Father died age 26 due to a stroke.    Siblings - an older brother and older sister        Relocated to [place]: yes   If relocated, came to [place]: year: 76   Reason:  Marriage to husband        Residential:    Who lives in the home currently?: Patient and her husband    Housing: own housing    Risk of losing current housing:  no    Homelessness: yes - 1979 -1980    Eviction: no         Food Insecurity: No currently since getting food stamps as of June '22        Transportation: car driven by family        Marital/partner status: Married to husband, Leonor Bring for 41 years. Husband is 24 yrs old.    Sexually active: no     Related arguments:  no         Children: (number and ages, living with) - Daughter age 35, Powell, and another daughter age 60, Burnard. Also has stepchildren    Grandchildren - granddaughter age 76 and grandson age 6        Education    Through what year did you complete in school: high school graduate    History of Learning Disability or Learning Difficulty: yes - difficulty processing and understanding        Work    Income/Employment/Disability: Futures trader. 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 Electrical engineer: no     Vocational Rehabilitation: no         Financial planner:  Has not served in the [employer] to Firearms: None     Social Drivers of Psychologist, prison and probation services Strain: Medium Risk (11/04/2022)    Overall Financial Resource Strain (CARDIA)     Difficulty of Paying Living Expenses: Somewhat hard   Food Insecurity: No Food Insecurity (03/08/2024)    Hunger Vital Sign     Worried About Running Out of Food in the Last Year: Never true     Ran Out of Food in the Last Year: Never true   Transportation Needs: No Transportation Needs (03/08/2024)    PRAPARE - Therapist, art (Medical): No     Lack of Transportation (Non-Medical): No   Housing: Low Risk  (03/08/2024)    Housing     Within the past 12 months, have you ever stayed: outside, in a car, in a tent, in an overnight shelter, or temporarily in someone else's home (i.e. couch-surfing)?: No     Are you worried about losing your housing?: No      Past Surgical History:   Procedure Laterality Date    HYSTERECTOMY      PR BRONCHOSCOPY,DIAGNOSTIC W LAVAGE Bilateral 09/20/2020    Procedure: BRONCHOSCOPY, RIGID OR FLEXIBLE, INCLUDE FLUOROSCOPIC GUIDANCE WHEN PERFORMED; W/BRONCHIAL ALVEOLAR LAVAGE WITH MODERATE SEDATION;  Surgeon: Marcel Emi Saxon, MD;  Location: BRONCH PROCEDURE LAB [facility];  Service: Pulmonary    PR SUBTOT REMV VITREOUS,MECH VIRECTOMY Right 08/26/2020    Procedure: REMOVAL OF VITREOUS, ANTERIOR APPROACH(OPEN SKY TECHNIQUE OR LIMBAL INCISION); SUBTL REMOVAL W/MECH VITRECT;  Surgeon: Vinie Jama Schneider, MD;  [facility]: [facility];  Service: Ophthalmology    PR UPPER GI ENDOSCOPY,BIOPSY N/A 12/11/2021    Procedure: UGI ENDOSCOPY; WITH BIOPSY, SINGLE OR MULTIPLE;  Surgeon: Prentice CHRISTELLA Feeling, MD;  Location: [facility]URES [facility];  Service: Gastroenterology    PR UPPER GI ENDOSCOPY,BIOPSY N/A 04/07/2023    Procedure: UGI ENDOSCOPY; WITH BIOPSY, SINGLE OR MULTIPLE;  Surgeon: Filbert Rodgers BROCKS, MD;  Location: GI PROCEDURES [facility];  Service: Gastroenterology    PR XCAPSL CTRC RMVL INSJ IO LENS PROSTH W/O ECP Left 08/12/2020    Procedure: EXTRACAPSULAR CATARACT REMOVAL W/INSERTION OF INTRAOCULAR LENS PROSTHESIS, MANUAL OR MECHANICAL TECHNIQUE WITHOUT ENDOSCOPIC CYCLOPHOTOCOAGULATION;  Surgeon: Vinie Jama Schneider, MD;  Location: [facility] OR [facility];  Service: Ophthalmology    PR XCAPSL CTRC RMVL INSJ IO LENS PROSTH W/O ECP Right 08/26/2020    Procedure: EXTRACAPSULAR CATARACT REMOVAL W/INSERTION OF INTRAOCULAR LENS PROSTHESIS, MANUAL OR MECHANICAL TECHNIQUE WITHOUT ENDOSCOPIC CYCLOPHOTOCOAGULATION;  Surgeon: Vinie Jama Schneider, MD;  Location: [facility] OR [facility];  Service: Ophthalmology        Current Outpatient Medications:     abaloparatide  80 mcg (3,120 mcg/1.56 mL) PnIj, Inject 80 mcg under the skin daily., Disp: 1.56 mL, Rfl: 6    albuterol  2.5 mg /3 mL (0.083 %) nebulizer solution, Inhale 3 mL (2.5 mg total) by nebulization every six (6) hours as needed for wheezing or shortness of breath., Disp: 360 mL, Rfl: 11    albuterol  HFA 90 mcg/actuation inhaler, Inhale 1 puff every four (4) hours as needed for wheezing., Disp: 8.5 g, Rfl: 11    citalopram  (CELEXA ) 20 MG tablet, Take 1 tablet (20 mg total) by mouth daily., Disp: 90 tablet, Rfl: 3    empty container (SHARPS CONTAINER) Misc, Use as directed, Disp: 1 each, Rfl: 2    empty container Misc, Use as directed, Disp: 1 each, Rfl: 3    EPINEPHrine  (EPIPEN ) 0.3 mg/0.3 mL injection, Inject 0.3 mL (0.3 mg total) into the muscle once as needed for anaphylaxis for up to 1 dose., Disp: 2 each, Rfl: 1    hydroxychloroquine  (PLAQUENIL ) 200 mg tablet, Take 1 tablet (200 mg total) by mouth daily., Disp: 90 tablet, Rfl: 3    ipratropium (ATROVENT ) 0.02 % nebulizer solution, Inhale the contents of 1 vial (500 mcg total) by nebulization Four (4) times a day., Disp: 62.5 mL, Rfl: 12    montelukast  (SINGULAIR ) 10 mg tablet, Take 1 tablet (10 mg total) by mouth daily., Disp: 90 tablet, Rfl: 3    omalizumab  (XOLAIR ) 150 mg/mL syringe, Inject the contents of 1 syringe (150 mg total) under the skin every fourteen (14) days., Disp: 6 mL, Rfl: 3    pen needle, diabetic 32 gauge x 5/32 (4 mm) Ndle, Use as directed to inject Tymlos , Disp: 100 each, Rfl: 3    pilocarpine  (SALAGEN ) 5 MG tablet, Take 1 tablet (5 mg total) by mouth Three (3) times a day., Disp: 270 tablet, Rfl: 3    pregabalin  (LYRICA ) 25 MG capsule, Take 1 capsule (25 mg total) by mouth Two (2) times a day., Disp: 180 capsule, Rfl: 0    rosuvastatin  (CRESTOR ) 10 MG tablet, Take 1 tablet (10 mg total) by mouth nightly.,  Disp: 100 tablet, Rfl: 3    Current Facility-Administered Medications:     sodium chloride  3 % nebulizer solution 4 mL, 4 mL, Nebulization, Once, Thornton, Maura E, MD      Review of Systems  A 12 point review of systems was otherwise negative except as noted in the HPI.      Objective:      BP 106/66 (BP Site: L Arm, BP Position: Sitting)  - Pulse 79  - Ht 170.2 cm (5' 7.01)  - Wt 58.1 kg (128 lb)  - LMP  (LMP Unknown)  - BMI 20.04 kg/m??   Wt Readings from Last 3 Encounters:   08/29/24 58.1 kg (128 lb)   07/19/24 58.6 kg (129 lb 3.2 oz)   05/17/24 56.7 kg (125 lb)      BMI Readings from Last 3 Encounters:   08/29/24 20.04 kg/m??   07/19/24 20.24 kg/m??   05/17/24 19.41 kg/m??     GEN: appears well, in NAD  HEENT: sclerae anicteric  NECK:  no visible neck mass or deformity  CHEST: normal breathing chest movements  NEURO: Aox3, following commands. In wheelchair  PSYCH: normal affect.  SKIN: no visible rash    Lab Review: most recent Calcium , Cr, GFR, Alk Phos, Phosphate, PTH and vitamin levels reviewed   Component      Latest Ref Rng 05/17/2024   Sodium      135 - 145 mmol/L 143    Potassium      3.4 - 4.8 mmol/L 3.7    Chloride      98 - 107 mmol/L 104    CO2      20.0 - 31.0 mmol/L 30.1    Anion Gap      5 - 14 mmol/L 9    Bun      9 - 23 mg/dL 14    Creatinine      9.44 - 1.02 mg/dL 9.27    BUN/Creatinine Ratio 19    eGFR CKD-EPI (2021) Female      >=60 mL/min/1.43m2 >90    Glucose      70 - 179 mg/dL 95    Calcium       8.7 - 10.4 mg/dL 9.3        Component      Latest Ref Rng 03/08/2024   Vitamin D  Total (25OH)      20.0 - 80.0 ng/mL 29.8        Component      Latest Ref Rng 12/22/2022   Sodium      135 - 145 mmol/L 142    Potassium      3.4 - 4.8 mmol/L 3.7    Chloride      98 - 107 mmol/L 107    CO2      20.0 - 31.0 mmol/L 30.7    Anion Gap      5 - 14 mmol/L 4 (L)    Bun      9 - 23 mg/dL 9    Creatinine      9.44 - 1.02 mg/dL 9.35    BUN/Creatinine Ratio 14    eGFR CKD-EPI (2021) Female      >=60 mL/min/1.49m2 >90    Glucose      70 - 179 mg/dL 99    Calcium       8.7 - 10.4 mg/dL 9.7    Vitamin D  Total (25OH)      20.0 - 80.0 ng/mL 20.5  Legend:  (L) Low  Component      Latest Ref Rng & Units 09/24/2020 03/25/2021 11/10/2021   TSH      0.550 - 4.780 uIU/mL  1.434    Vitamin D  Total (25OH)      20.0 - 80.0 ng/mL 29.3  25.1   Free T4      0.89 - 1.76 ng/dL        Component      Latest Ref Rng & Units 08/04/2021   Sodium      135 - 145 mmol/L 140   Potassium      3.4 - 4.8 mmol/L 4.1   Chloride      98 - 107 mmol/L 106   CO2      20.0 - 31.0 mmol/L 29.5   Anion Gap      5 - 14 mmol/L 5   Bun      9 - 23 mg/dL 14   Creatinine      9.39 - 0.80 mg/dL 9.30   BUN/Creatinine Ratio       20   eGFR CKD-EPI (2021) Female      >=60 mL/min/1.25m2 >90   Glucose      70 - 99 mg/dL 96   Calcium       8.7 - 10.4 mg/dL 9.7   Albumin      3.4 - 5.0 g/dL 3.8   Total Protein      5.7 - 8.2 g/dL 7.5   Total Bilirubin      0.3 - 1.2 mg/dL 0.5   AST      <=65 U/L 18   ALT      10 - 49 U/L <7 (L)   Alkaline Phosphatase      46 - 116 U/L 93       Radiology: most recent DXA scan reviewed and compared to previous one (if available)     DATE: 09/09/2021 2:34 PM  ACCESSION: [id number]  DICTATED: 09/09/2021 2:37 PM  INTERPRETATION LOCATION: [facility]     CLINICAL INDICATION: 68 years old Female with bone scan for c/f osteoporosis  - S72.001D - Closed fracture of right hip with routine healing, subsequent encounter       COMPARISON: None.     TECHNIQUE: Bone mineral density was assessed using the Horizon A bone densitometer.  The results of the study are expressed in bone mineral density (BMD) and interpreted using World Health Organization [facility]) criteria, per ISCD positions.     FINDINGS     Lumbar Spine       Excluded levels: none.      BMD:   0.595 (g/cm)        T score:  -4.1     Lumbar WHO classification: OSTEOPOROSIS.          Left Hip         Femoral Neck:           BMD:  0.463 (g/cm)           T score:  -3.5        Total Hip           BMD:  0.448 (g/cm)            T score:  -4.1      Hip WHO classification: OSTEOPOROSIS.

## 2024-08-29 NOTE — Unmapped (Signed)
 It was a pleasure seeing you today at our [facility] Endocrinology clinic.   If any testing was ordered today, most of the time the results will appear in your MyChart account even before I see them. Once all your results are available, I will contact you within 1-2 weeks with an explanation. Please contact me if you do not hear from me by then.   Please make sure you have access to MyChart since this is how I will communicate with you.  If you do not have MyChart, I will send you a letter which may take 1-2 weeks to arrive.      Remember to call us  with any urgent issues. Please allow a few days for mychart messages to be read and answered.     If you are seen for diabetes, please bring your glucometer and all devices to every visit.   All refill requests must be submitted 14 days in advance to allow enough time to be processed.       Regarding calcium :  - your goal calcium  intake from dietary sources if possible is 1200 mg every day     Foods and drinks with calcium :   Food        Calcium  in milligrams  Milk (skim, 2%, or whole; 8 oz [240 mL]) 300   Yogurt (6 oz [168 g]) 250   Orange juice (with calcium ; 8 oz [240 mL]) 300   Tofu with calcium  (0.5 cup [886 g]) 435   Cheese (1 oz [28 g]) 195 to 335 (hard cheese = higher calcium )   Cottage cheese (0.5 cup [886 g]) 130   Ice cream or frozen yogurt (0.5 cup [886 g]) 100   Non-dairy milks (soy, oat, almond; 8 oz [240 mL]) 300 to 450   Beans (0.5 cup cooked [886 g]) 60 to 80   Dark, leafy green vegetables (0.5 cup cooked [886 g]) 50 to 135   Almonds (24 whole) 70   Orange (1 medium) 60     Regarding vitamin d3: Your goal vitamin d  level > 30 ng/ml.     Regarding exercise: Any physical activity is better than none. Studies suggest weight bearing/resistance training exercises are helpful to promote bone health.   RESISTANCE EXERCISE (RE) is defined as a physical conditioning program that enhances fitness, health, and sports performance, using a variety of training modalities such as free weights, weight machines, medicine balls, elastic bands, and different movement velocities. The RE interventions including weighted lunges, hip abduction/adduction, knee extension/flexion, plantar-/dorsi-flexion, back extension, reverse chest fly, and abdominal exercises or a smaller number of compound movements of squats and deadlifts, target the major muscle groups attached to the hip and spine. The magnitude of mechanical load is important for bone formation, and RE elicits a magnitude of strain that exceeds the threshold required for increased bone modeling. RE has been frequently prescribed because it has been consistently shown to be safe and effective for improving muscle mass, size, and strength in middle-aged and older adults, including the frail elderly and even those with a history of fracture. The intensity and type of RE should be individualized according to tolerance and ability of adults, particularly in the presence of pain. At least two sets of one exercise for each major muscle group should be performed at a target intensity of eight to 12 repetition maximum (RM); however, for some individuals who are previously sedentary or unfamiliar with RE, it should be started at a lower intensity. In terms of frequency  of RE, the WHO global recommendation for older patients of the 16 years and above age group suggests that muscle-strengthening activities, involving major muscle groups, should be done on 2 or more days a week .    Table 1. The Type of Resistance Exercise Training Program.  Type Description Examples   Isometric RE A static contraction of muscle against external resistance without change in its length or joint motion Yoga poses such as Plank or the Warrior variations, side bridge, hundred breaths exercise, pushing against a fence   Isotonic RE A dynamic exercise against resistance as a muscle lengthens or shortens through the available range of motion     - Concentric contraction: an active muscle undergoes shortening while overcoming external resistance Contraction of biceps curl with fixed weight    - Eccentric contraction: an active muscle undergoes lengthening while being overcome by an external resistance Extension of quadriceps during knee bend   Isokinetic RE An active exercise in which a muscle or group of muscles contracts against a controlled accommodating resistance that is moving at a constant angular velocity Fitness machines (e.g., stationary bike, bench press machine, bent-over row), dynamometer   RE, resistance exercise.    The greatest skeletal benefits from RE have been achieved when the resistance was progressively increased over time, the magnitude of mechanical load was high (around 80% to 85% one RM), exercise was performed at least twice a week, and large muscles crossing the hip and spine were targeted. The spine may be more responsive to RE than the hip.      Consult with a trainer or physical therapist for exercise modifications to your individual limitations and abilities.   Avoid certain types of movements that might put higher load on your spine, including upper body twisting (spinal twisting) and bending forward while lifting heavy objects.     See some additional exercise programs you could consider bellow:  https://osteoporosis.ca/too-fit-to-fracture/  http://www.osteoporosis.ca/wp-content/uploads/OC-Too-Fit-to-Fall-or-Fracture.pdf  https://osteoporosis.ca/video-series-on-exercise-and-osteoporosis/  http://ward-kane.com/

## 2024-08-30 LAB — VITAMIN D 25 HYDROXY: VITAMIN D, TOTAL (25OH): 21.7 ng/mL (ref 20.0–80.0)

## 2024-08-31 DIAGNOSIS — B0229 Other postherpetic nervous system involvement: Principal | ICD-10-CM

## 2024-08-31 MED ORDER — PREGABALIN 25 MG CAPSULE
ORAL_CAPSULE | Freq: Two times a day (BID) | ORAL | 0 refills | 90.00000 days | Status: CP
Start: 2024-08-31 — End: 2025-08-31
  Filled 2024-09-01: qty 180, 90d supply, fill #0

## 2024-08-31 MED ORDER — EMPTY CONTAINER
3 refills | 0.00000 days
Start: 2024-08-31 — End: ?

## 2024-08-31 MED ORDER — ERGOCALCIFEROL (VITAMIN D2) 1,250 MCG (50,000 UNIT) CAPSULE
ORAL_CAPSULE | ORAL | 1 refills | 56.00000 days | Status: CP
Start: 2024-08-31 — End: ?
  Filled 2024-09-01: qty 8, 56d supply, fill #0

## 2024-08-31 MED FILL — XOLAIR 150 MG/ML SUBCUTANEOUS SYRINGE: SUBCUTANEOUS | 28 days supply | Qty: 2 | Fill #6

## 2024-08-31 MED FILL — ROSUVASTATIN 10 MG TABLET: ORAL | 100 days supply | Qty: 100 | Fill #1

## 2024-09-01 MED ORDER — EMPTY CONTAINER
3 refills | 0.00000 days | Status: CP
Start: 2024-09-01 — End: ?
  Filled 2024-09-01: qty 1, 90d supply, fill #0

## 2024-09-01 MED FILL — HYDROXYCHLOROQUINE 200 MG TABLET: ORAL | 90 days supply | Qty: 90 | Fill #2

## 2024-09-08 DIAGNOSIS — E785 Hyperlipidemia, unspecified: Principal | ICD-10-CM

## 2024-09-08 DIAGNOSIS — M8000XD Age-related osteoporosis with current pathological fracture, unspecified site, subsequent encounter for fracture with routine healing: Principal | ICD-10-CM

## 2024-09-08 DIAGNOSIS — I82562 Chronic embolism and thrombosis of left calf muscular vein: Principal | ICD-10-CM

## 2024-09-08 DIAGNOSIS — Z1211 Encounter for screening for malignant neoplasm of colon: Principal | ICD-10-CM

## 2024-09-08 DIAGNOSIS — Z1231 Encounter for screening mammogram for malignant neoplasm of breast: Principal | ICD-10-CM

## 2024-09-08 DIAGNOSIS — J479 Bronchiectasis, uncomplicated: Principal | ICD-10-CM

## 2024-09-08 NOTE — Unmapped (Addendum)
 Please call: Pulmonology- [phone] to schedule.      PRIORITIES: Send in the FIT (stool) card and schedule your mammogram.    If you would like to call to schedule your mammogram, the number is [phone].

## 2024-09-08 NOTE — Unmapped (Signed)
 [facility] Internal Medicine at [facility]     Reason for visit: Follow up    Questions / Concerns that need to be addressed: Follow up, concerns of feet swelling     Screening BP- 102/61 81        PTHomeBP       HCDM reviewed and updated in Epic:    We are working to make sure all of our patients??? wishes are updated in Epic and part of that is documenting a Environmental health practitioner for each patient  A Health Care Decision Maker is someone you choose who can make health care decisions for you if you are not able - who would you most want to do this for you????  is already up to date.    HCDM, First AlternateDEEYA, RICHESON - Daughter [phone]    BPAs completed:  Influenza vaccine      __________________________________________________________________________________________    SCREENINGS COMPLETED IN FLOWSHEETS      AUDIT       PHQ2       PHQ9          GAD7       COPD Assessment       Falls Risk

## 2024-09-08 NOTE — Unmapped (Signed)
 Chronic postthrombotic syndrome with ongoing swelling and minor discomfort in the left lower extremity due to a chronic distal deep vein thrombosis. No acute clot was found on previous ultrasound, and the hematologist advised against anticoagulation.  - Encouraged her to update her cancer screenings (see below)  - Continue wearing compression stockings.   - If swelling worsens, consider repeat ultrasound.  Orders:    Immunochemical Fecal Occult Blood Test (FIT), automated; Future

## 2024-09-08 NOTE — Unmapped (Signed)
 Hyperlipidemia is being managed with rosuvastatin  (instructed to restart at last appointment).  - Continue rosuvastatin  (Crestor ) as prescribed.  - She prefers to get lipid panel at next appointment

## 2024-09-08 NOTE — Unmapped (Signed)
 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 No acute clot was found on previous ultrasound, and the hematologist advised against anticoagulation.  - Encouraged her to update her cancer screenings (see below)  - Continue wearing compression stockings.   - If swelling worsens, consider repeat ultrasound.  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 She continues with airway clearance techniques and inhalers.  - Schedule follow-up with pulmonologist (overdue and provided number)       Encounter for screening mammogram for malignant neoplasm of breast    Orders:    Mammo Screening Bilateral Tomo; Future    Screening for colon cancer    Orders:    Immunochemical Fecal Occult Blood Test (FIT), automated; Future    Hyperlipidemia, unspecified hyperlipidemia type  Hyperlipidemia is being managed with rosuvastatin  (instructed to restart at last appointment).  - Continue rosuvastatin  (Crestor ) as prescribed.  - She prefers to get lipid panel at next appointment         Assessment & Plan  Osteoporosis  Osteoporosis management is ongoing. She recently had Reclast  and is transitioning to [employer]. Vitamin D  levels are being optimized.  - Continue with Evenity as planned by endocrinology.  - Increase vitamin D  intake to 50,000 IU weekly.    Vitamin D  deficiency  Vitamin D  deficiency is being addressed with increased supplementation.  - Take vitamin D  50,000 IU weekly.      HCM  - Flu and COVID vaccines given today    Return in about 6 months (around 03/09/2025).  __________________________________________________________    History of Present Illness  68 year old with a history of Sjogren's syndrome, anxiety/depression/PTSD, leukopenia, gait instability, osteoporosis, bronchiectasis and asthma/COPD overlap and chronic distal blood clot who presents with persistent leg swelling.    She experiences persistent swelling in her foot, which has not worsened but remains present even when she is not active. She recalls a previous diagnosis of an old blood clot, identified as chronic and distal, for which anticoagulation was deemed unnecessary. Discomfort and a sensation of hardness in her feet accompany the swelling. She uses compression stockings to manage the swelling but needs to remove them after wearing them all day.    She has a history of bronchiectasis and asthma-COPD overlap and continues to perform airway clearance. Her respiratory condition is stable aside from the issue with the clot. She is overdue for a follow-up with her pulmonary specialist, initially recommended for six months after her last visit in February.    She is managing osteoporosis with Endocrinology. She is planning to transition to [facility] for osteoporosis and has been instructed to increase her vitamin D  levels with a weekly dose of 50,000 IU.    Her current medications include rosuvastatin , which she takes at night for cholesterol management. She confirms that she has restarted this medication.    She had an echo done which showed mild MR but was otherwise normal.     __________________________________________________________    Problem List:  Patient Active Problem List   Diagnosis    Abnormal gait    Anxiety    Arthralgia  of hip    Family history of genetic disorder    Family history of malignant neoplasm of breast    Family history of malignant neoplasm of cervix    Mixed anxiety depressive disorder    Abnormal findings on esophagogastroduodenoscopy (EGD)    Scoliosis    Sjogren's syndrome with keratoconjunctivitis sicca (HHS-HCC)    Leukopenia    Age-related nuclear cataract of both eyes    Osteoporosis with current pathological fracture    Bronchiectasis without complication    (CMS-HCC)    Asthma-COPD overlap syndrome    (CMS-HCC)    Esophageal dysphagia    Hyperlipidemia       Medications:  Reviewed in EPIC  __________________________________________________________    Physical Exam:   Vital Signs:  Vitals:    09/08/24 1230   BP: 102/61   BP Site: L Arm   BP Position: Sitting   BP Cuff Size: Medium   Pulse: 79   Temp: 36.7 ??C (98.1 ??F)   TempSrc: Temporal   SpO2: 98%   Weight: 54.4 kg (120 lb)   Height: 170.2 cm (5' 7)      Body mass index is 18.79 kg/m??.    Physical Exam  GENERAL: Well-appearing   CHEST: Lungs moving air well. Bilateral scattered expiratory wheeze, worse on right.   CARDIOVASCULAR: Regular rate and rhythm, no murmurs.  EXTREMITIES: Trace swelling in the right leg with sock lines, 1+ swelling in the left leg up to mid shin.

## 2024-09-08 NOTE — Unmapped (Addendum)
 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 She continues with airway clearance techniques and inhalers.  - Schedule follow-up with pulmonologist (overdue and provided number)

## 2024-09-20 NOTE — Progress Notes (Signed)
 [facility] Home Delivery Pharm[facility] Refill Coordination Note    Specialty Medication(s) to be Shipped:   CF/Pulmonary/Asthma: Xolair     Other medication(s) to be shipped: No additional medications requested for fill at this time    Specialty Medications not needed at this time: N/A     Christy Ibarra, DOB: [birth year]  Phone: [phone] (home)       All above HIPAA information was verified with patient.     Was a nurse, learning disability used for this call? No    Completed refill call assessment today to schedule patient's medication shipment from the [facility]  [phone]).  All relevant notes have been reviewed.     Specialty medication(s) and dose(s) confirmed: Regimen is correct and unchanged.   Changes to medications: Christy Ibarra reports no changes at this time.  Changes to insurance: No  New side effects reported not previously addressed with a pharmacist or physician: None reported  Questions for the pharmacist: No    Confirmed patient received a Conservation Officer, Historic Buildings and a Surveyor, Mining with first shipment. The patient will receive a drug information handout for each medication shipped and additional FDA Medication Guides as required.       DISEASE/MEDICATION-SPECIFIC INFORMATION        For patients on injectable medications: Next injection is scheduled for 09/21/24.    SPECIALTY MEDICATION ADHERENCE     Medication Adherence    Patient reported X missed doses in the last month: 0  Specialty Medication: XOLAIR  150 mg/mL syringe (omalizumab )  Patient is on additional specialty medications: No  Patient is on more than two specialty medications: No  Any gaps in refill history greater than 2 weeks in the last 3 months: no  Demonstrates understanding of importance of adherence: yes              Were doses missed due to medication being on hold? No    XOLAIR  150  mg/ml: 14 days of medicine on hand       REFERRAL TO PHARMACIST     Referral to the pharmacist: Not needed      [facility] Shipping address confirmed in Epic.     Cost and Payment: Patient has a $0 copay, payment information is not required.    Delivery Scheduled: Yes, Expected medication delivery date: 09/28/24.     Medication will be delivered via Same Day Courier to the prescription address in Epic WAM.    Christy Ibarra   [facility] Delivery Pharm[facility]  Specialty Technician

## 2024-09-28 MED FILL — XOLAIR 150 MG/ML SUBCUTANEOUS SYRINGE: SUBCUTANEOUS | 28 days supply | Qty: 2 | Fill #7

## 2024-10-17 NOTE — Progress Notes (Signed)
 [facility] Home Delivery Pharm[facility] Refill Coordination Note    Specialty Medication(s) to be Shipped:   CF/Pulmonary/Asthma: Xolair     Other medication(s) to be shipped: No additional medications requested for fill at this time    Specialty Medications not needed at this time: N/A     Christy Ibarra, DOB: [birth year]  Phone: [phone] (home)       All above HIPAA information was verified with patient.     Was a nurse, learning disability used for this call? No    Completed refill call assessment today to schedule patient's medication shipment from the [facility] Delivery Pharm[facility]  [phone]).  All relevant notes have been reviewed.     Specialty medication(s) and dose(s) confirmed: Regimen is correct and unchanged.   Changes to medications: Wylee reports no changes at this time.  Changes to insurance: No  New side effects reported not previously addressed with a pharmacist or physician: None reported  Questions for the pharmacist: No    Confirmed patient received a Conservation Officer, Historic Buildings and a Surveyor, Mining with first shipment. The patient will receive a drug information handout for each medication shipped and additional FDA Medication Guides as required.       DISEASE/MEDICATION-SPECIFIC INFORMATION        For patients on injectable medications: Next injection is scheduled for 10/18/24.    SPECIALTY MEDICATION ADHERENCE     Medication Adherence    Patient reported X missed doses in the last month: 0  Specialty Medication: XOLAIR  150 mg/mL syringe (omalizumab )  Patient is on additional specialty medications: No  Patient is on more than two specialty medications: No  Any gaps in refill history greater than 2 weeks in the last 3 months: no  Demonstrates understanding of importance of adherence: yes              Were doses missed due to medication being on hold? No    XOLAIR  150   mg/ml: 14 days of medicine on hand       REFERRAL TO PHARMACIST     Referral to the pharmacist: Not needed      [facility] Shipping address confirmed in Epic.     Cost and Payment: Patient has a $0 copay, payment information is not required.    Delivery Scheduled: Yes, Expected medication delivery date: 10/26/24.     Medication will be delivered via Same Day Courier to the prescription address in Epic WAM.    [name]   [facility] and [facility] Delivery Pharmacy  Specialty Technician

## 2024-10-26 MED FILL — XOLAIR 150 MG/ML SUBCUTANEOUS SYRINGE: SUBCUTANEOUS | 28 days supply | Qty: 2 | Fill #8

## 2024-11-22 DIAGNOSIS — M2550 Pain in unspecified joint: Principal | ICD-10-CM

## 2024-11-22 DIAGNOSIS — M3501 Sicca syndrome with keratoconjunctivitis: Principal | ICD-10-CM

## 2024-11-22 MED ORDER — HYDROXYCHLOROQUINE 200 MG TABLET
ORAL_TABLET | Freq: Every day | ORAL | 0 refills | 30.00000 days
Start: 2024-11-22 — End: ?

## 2024-11-23 MED ORDER — HYDROXYCHLOROQUINE 200 MG TABLET
ORAL_TABLET | Freq: Every day | ORAL | 0 refills | 30.00000 days | Status: CP
Start: 2024-11-23 — End: ?
  Filled 2024-11-28: qty 30, 30d supply, fill #0

## 2024-11-28 MED FILL — ERGOCALCIFEROL (VITAMIN D2) 1,250 MCG (50,000 UNIT) CAPSULE: ORAL | 56 days supply | Qty: 8 | Fill #1

## 2024-12-07 NOTE — Progress Notes (Unsigned)
 Last clinic visit: Nov 2024    Accompanied by: her daughter, Powell, who contributed key portions of the history.     Chief complaint: follow-up Sjogren's Disease    History of Present Illness:     HPI:  Christy Ibarra is a 69 y.o. female with a complicated past history here to follow-up. She has a 10 year history of sicca symptoms, arthralgia, and gait abnormalities as well as +SSA/SSB, + ANA, + RF and lip biopsy with focal lymphocytic sialadenitis compatible with a diagnosis of Sjogren's. Also with mild leukopenia hematology felt may be due to Sjogren's. Neuro previously raised the question of whether worsening of her gait was due to transverse myelitis and questioned whether that could be due to Sjogren's. Arthralgia improved on Plaquenil  started Feb 2021. Her mobility and strength have also gradually improved.  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     Current treatment:   - Plaquenil  200 mg daily  - Salagen  5 mg PRN    Interval History:  - Overall feeling stable. Feels this is the longest time she has been stable and not had  extreme pain or extreme stiffness.   - No COPD exacerbations. Stays home a lot of avoid getting sick.   - She feels like she still has stiffness, not better or worse.   - She eats a clean diet, avoids sugars, gluten, and processed foods which she thinks has helped with some of the inflammation.   - She thinks Plaquenil  helps.   - Drinks lots of water  and takes Salagen  up to three times a day which helps with saliva. No choking episodes due to dry throat. Drinks water  with eating.  - Nearly edentulous.  - Some drier skin. Uses a lot of lotion.   - Strength pretty good. Balance continues to be off she attributes to peripheral neuropathy in her  feet and not feeling the ground. Not worsening. Does grocery shopping. Able to do the things she wants to do.     Objective      Visit Vitals  BP 94/67 (BP Position: Sitting, BP Cuff Size: Medium)   Pulse 83   Temp 36.3 ??C (97.3 ??F) (Temporal)   Wt 52.2 kg (115 lb)   LMP  (LMP Unknown)   BMI 18.01 kg/m??      GENERAL: The patient is well appearing, in no acute distress. Using wheelchair in clinic.   SKIN: No rash.   EYES: PERRL. Sclera anicteric, conjunctiva non- injected.   ENT: mucus membranes moist. Edentulous except two teeth  Neck: supple, no cervical lymphadenopathy  Respiratory: Breathing non-labored, CTA bilaterally  CV: Heart rate regular, no murmurs  GI: Abdomen soft, nontender, nondistended, no hepatosplenomegaly  VASCULAR: warm and well perfused extremities, no c/c/e.  NEURO: CN 2-12 grossly intact.   PSYCH: No depression or anxiety. Cooperative. Alert and oriented.   MUSCULOSKELETAL:   Bilateral shoulders, elbows, wrists, hands, fingers:  No deformity, erythema, warmth, swelling, effusion, tenderness, or limited ROM.?? Able to curl all fingers. Prayer sign negative.   Bilateral knees, ankles, feet, toes: No synovitis or TTP bilat knees or ankles. MTP squeeze negative.     Assessment/Plan:     Kirsta Ibarra is a 69 y.o. female here to follow-up for Sjogren's Disease.     Sjogren's Disease:  +SSA/SSB, + ANA, + RF and lip biopsy with focal lymphocytic sialadenitis. Also with some arthralgia that improved some  after started Plaquenil . Has dyspnea, now followed by pulm for COPD and possible bronchiolitis.   - Sjogren's symptoms remain stable. She feels Plaquenil  and Salagen  continue to help.   - She is asking about new medications for Sjogren's which are currently in clinical trials. Let her know not yet approved. Will also need to look into indications once approved.   - Continue Plaquenil  200 mg daily (refilled today).  - Continue Salagen  5 mg TID as needed (refilled today).   - She will continue to follow with pulm, asked her to call to schedule follow-up appt.      Osteoporosis: Dexa Oct 2022 with lumbar spine T score -4.1, L hip total -4.1, femoral neck -3.5.  - Now following with endocrinology. Completed Tymlos  01/2022 to 10/2023. Received Reclast  07/2024.   - Vit D 21 on 08/29/2024.     Immunization Counseling:  Influenza Vaccine: 09/08/2024  Covid-19 Vaccine: 09/08/2024  Pneumococcal Prevnar PCV-20: 03/18/2022    Long-term Plaquenil  use: Patient is currently taking Plaquenil  which requires intensive monitoring including regular eye exams due to risk for retinal toxicity.    - Plaquenil  started: Feb 2021  - Last eye exam was: 01/03/2024 with [facility] ophtho Dr. Shirley, retina measures thinner, could be daily variability. Has follow-up appt on 01/03/2025.   - Patient has the following risk factors for retinal toxicity from plaquenil : Age >60 yrs    We discussed the above including diagnosis and recommendations, agreed on the above plan, and all questions were answered.    Follow-up: Return for Follow-up Sjogren's in 12 months.    Lavanda Forte, MD, MSCI  Assistant Professor of Medicine  Department of Medicine/Division of Rheumatology  [employer]  at [facility]  [phone] clinic phone  [phone] clinic secure fax    I personally spent 20 minutes face-to-face and non-face-to-face in the care of this patient, which includes all pre, intra, and post visit time on the date of service.  All documented time was specific to the E/M visit and does not include any procedures that may have been performed.          Diagnoses and all orders for this visit:    Sjogren's syndrome with keratoconjunctivitis sicca (HHS-HCC)  -     hydroxychloroquine  (PLAQUENIL ) 200 mg tablet; Take 1 tablet (200 mg total) by mouth daily.    Polyarthralgia  -     hydroxychloroquine  (PLAQUENIL ) 200 mg tablet; Take 1 tablet (200 mg total) by mouth daily.    Other orders  -     pilocarpine  (SALAGEN ) 5 MG tablet; Take 1 tablet (5 mg total) by mouth Three (3) times a day. (APP/residents) involved in the patient's care. (Optional):[id number]}      There are no diagnoses linked to this encounter.

## 2024-12-08 ENCOUNTER — Ambulatory Visit
Admit: 2024-12-08 | Discharge: 2024-12-09 | Payer: Medicare (Managed Care) | Attending: Rheumatology | Primary: Rheumatology

## 2024-12-08 DIAGNOSIS — M3501 Sicca syndrome with keratoconjunctivitis: Principal | ICD-10-CM

## 2024-12-08 DIAGNOSIS — Z79899 Other long term (current) drug therapy: Principal | ICD-10-CM

## 2024-12-08 MED ORDER — HYDROXYCHLOROQUINE 200 MG TABLET
ORAL_TABLET | Freq: Every day | ORAL | 3 refills | 90.00000 days | Status: CP
Start: 2024-12-08 — End: ?

## 2024-12-08 MED ORDER — PILOCARPINE 5 MG TABLET
ORAL_TABLET | Freq: Three times a day (TID) | ORAL | 3 refills | 90.00000 days | Status: CP
Start: 2024-12-08 — End: 2025-12-08

## 2024-12-27 NOTE — Progress Notes (Signed)
 Christy Ibarra has been contacted in regards to their refill of Xolair . At this time, they have declined refill due to patient having 2 doses remaining. Refill assessment call date has been updated per the patient's request.
